# Patient Record
Sex: Female | Born: 1970
Health system: Southern US, Community
[De-identification: ages and names within clinical notes are randomized; demographics above are authoritative.]

## PROBLEM LIST (undated history)

## (undated) DIAGNOSIS — Z5189 Encounter for other specified aftercare: Secondary | ICD-10-CM

## (undated) DIAGNOSIS — K219 Gastro-esophageal reflux disease without esophagitis: Secondary | ICD-10-CM

## (undated) DIAGNOSIS — E079 Disorder of thyroid, unspecified: Secondary | ICD-10-CM

## (undated) DIAGNOSIS — T7840XA Allergy, unspecified, initial encounter: Secondary | ICD-10-CM

## (undated) DIAGNOSIS — I10 Essential (primary) hypertension: Secondary | ICD-10-CM

## (undated) DIAGNOSIS — N62 Hypertrophy of breast: Secondary | ICD-10-CM

## (undated) HISTORY — DX: Essential (primary) hypertension: I10

## (undated) HISTORY — PX: CHOLECYSTECTOMY: SHX55

## (undated) HISTORY — PX: TUBAL LIGATION: SHX77

## (undated) HISTORY — DX: Disorder of thyroid, unspecified: E07.9

## (undated) HISTORY — DX: Hypertrophy of breast: N62

## (undated) HISTORY — PX: ABDOMINAL HYSTERECTOMY: SHX81

## (undated) HISTORY — DX: Encounter for other specified aftercare: Z51.89

## (undated) HISTORY — PX: REDUCTION MAMMAPLASTY: SUR839

## (undated) HISTORY — PX: SHOULDER ARTHROSCOPY: SHX128

## (undated) HISTORY — DX: Allergy, unspecified, initial encounter: T78.40XA

---

## 2012-11-19 DIAGNOSIS — Z09 Encounter for follow-up examination after completed treatment for conditions other than malignant neoplasm: Secondary | ICD-10-CM | POA: Insufficient documentation

## 2012-12-29 DIAGNOSIS — D509 Iron deficiency anemia, unspecified: Secondary | ICD-10-CM | POA: Insufficient documentation

## 2012-12-29 DIAGNOSIS — E669 Obesity, unspecified: Secondary | ICD-10-CM | POA: Insufficient documentation

## 2012-12-29 DIAGNOSIS — D649 Anemia, unspecified: Secondary | ICD-10-CM | POA: Insufficient documentation

## 2012-12-29 DIAGNOSIS — I10 Essential (primary) hypertension: Secondary | ICD-10-CM

## 2012-12-29 HISTORY — DX: Essential (primary) hypertension: I10

## 2013-07-17 ENCOUNTER — Ambulatory Visit: Payer: Self-pay | Admitting: Family Medicine

## 2013-12-18 DIAGNOSIS — E0590 Thyrotoxicosis, unspecified without thyrotoxic crisis or storm: Secondary | ICD-10-CM | POA: Insufficient documentation

## 2014-05-24 DIAGNOSIS — R7303 Prediabetes: Secondary | ICD-10-CM | POA: Insufficient documentation

## 2014-09-28 ENCOUNTER — Ambulatory Visit (INDEPENDENT_AMBULATORY_CARE_PROVIDER_SITE_OTHER): Payer: Medicaid Other | Admitting: Family Medicine

## 2014-09-28 ENCOUNTER — Encounter: Payer: Self-pay | Admitting: Family Medicine

## 2014-09-28 VITALS — BP 126/82 | HR 91 | Temp 98.1°F | Resp 18 | Ht 62.0 in | Wt 207.2 lb

## 2014-09-28 DIAGNOSIS — E0590 Thyrotoxicosis, unspecified without thyrotoxic crisis or storm: Secondary | ICD-10-CM | POA: Insufficient documentation

## 2014-09-28 DIAGNOSIS — IMO0001 Reserved for inherently not codable concepts without codable children: Secondary | ICD-10-CM | POA: Insufficient documentation

## 2014-09-28 DIAGNOSIS — R5383 Other fatigue: Secondary | ICD-10-CM | POA: Insufficient documentation

## 2014-09-28 DIAGNOSIS — J302 Other seasonal allergic rhinitis: Secondary | ICD-10-CM | POA: Insufficient documentation

## 2014-09-28 DIAGNOSIS — E669 Obesity, unspecified: Secondary | ICD-10-CM | POA: Diagnosis not present

## 2014-09-28 DIAGNOSIS — E042 Nontoxic multinodular goiter: Secondary | ICD-10-CM | POA: Insufficient documentation

## 2014-09-28 DIAGNOSIS — R7989 Other specified abnormal findings of blood chemistry: Secondary | ICD-10-CM | POA: Insufficient documentation

## 2014-09-28 MED ORDER — PHENTERMINE HCL 37.5 MG PO CAPS: 37.5000 mg | ORAL_CAPSULE | Freq: Every day | ORAL | Status: DC

## 2014-09-28 MED ORDER — PHENTERMINE HCL 37.5 MG PO CAPS
37.5000 mg | ORAL_CAPSULE | Freq: Every day | ORAL | Status: DC
Start: 2014-09-28 — End: 2014-09-28

## 2014-09-28 NOTE — Progress Notes (Signed)
Name: Wendy Kent   MRN: [id number]    DOB: 27-Dec-1970   Date:09/28/2014       Progress Note  Subjective  Chief Complaint  Chief Complaint  Patient presents with  . Obesity     3 month follow-up    HPI  Patient is here for routine follow up of Obesity. The patient is appropriately concerned about their weight. Onset of weight issues started in ad. Highest recorded weight 222 lbs. Associated symptoms or diseases include pain in weight bearing joints and do not include HTN, HLD, DMII, skin lesions, depression, poor self esteem, snoring, sleep apnea at this time. Current efforts to reduce weight include low carbohydrate, low fat diet with regular exercise and prescription phentermine.  No side effects reported from the phentermine. She feels today that she has hit a plateau with her weight loss. She is busy with school, work, her family. She is planning to take the summer off from school so she can focus on her family and get more exercise.  Describe current exercise regimen: walking 3days a week for 30-41mins. Describe current diet regimen: Low carbohydrate, low fat, high protein diet with regular meals.        History  Substance Use Topics  . Smoking status: Never Smoker   . Smokeless tobacco: Not on file  . Alcohol Use: 0.0 oz/week    0 Standard drinks or equivalent per week     Comment: socially     Current outpatient prescriptions:  .  fluticasone (FLONASE) 50 MCG/ACT nasal spray, Place into the nose., Disp: , Rfl:  .  phentermine 37.5 MG capsule, Take 1 capsule (37.5 mg total) by mouth daily., Disp: 30 capsule, Rfl: 0 .  phentermine 37.5 MG capsule, Take 1 capsule (37.5 mg total) by mouth daily., Disp: 30 capsule, Rfl: 0 .  phentermine 37.5 MG capsule, Take 1 capsule (37.5 mg total) by mouth daily., Disp: 30 capsule, Rfl: 0  Allergies  Allergen Reactions  . Percocet [Oxycodone-Acetaminophen] Anaphylaxis    Review of Systems  Ten systems reviewed and is negative  except as mentioned in HPI.    Objective  BP 126/82 mmHg  Pulse 91  Temp(Src) 98.1 F (36.7 C) (Oral)  Resp 18  Ht 5\' 2"  (1.575 m)  Wt 207 lb 3.2 oz (93.985 kg)  BMI 37.89 kg/m2  SpO2 97%  Physical Exam  Constitutional: Patient appears obese and well-nourished. In no distress.  HEENT:  - Head: Normocephalic and atraumatic.  - Ears: Bilateral TMs gray, no erythema or effusion - Nose: Nasal mucosa moist - Mouth/Throat: Oropharynx is clear and moist. No tonsillar hypertrophy or erythema. No post nasal drainage.  - Eyes: Conjunctivae clear, EOM movements normal. PERRLA. No scleral icterus.  Neck: Normal range of motion. Neck supple. No JVD present. No thyromegaly present.  Cardiovascular: Normal rate, regular rhythm and normal heart sounds.  No murmur heard.  Pulmonary/Chest: Effort normal and breath sounds normal. No respiratory distress. Musculoskeletal: Normal range of motion bilateral UE and LE, no joint effusions. Peripheral vascular: Bilateral LE no edema. Neurological: CN II-XII grossly intact with no focal deficits. Alert and oriented to person, place, and time. Coordination, balance, strength, speech and gait are normal.  Skin: Skin is warm and dry. No rash noted. No erythema.  Psychiatric: Patient has a normal mood and affect. Behavior is normal in office today. Judgment and thought content normal in office today.    Assessment & Plan  1. Obesity, Class II, BMI 35-39.9 The patient has  been counseled on their higher than normal BMI.  They have verbally expressed understanding their increased risk for other diseases.  In efforts to meet a better target BMI goal the patient has been counseled on lifestyle, diet and exercise modification tactics. Start with moderate intensity aerobic exercise (walking, jogging, elliptical, swimming, group or individual sports, hiking) at least 69mins a day at least 4 days a week and increase intensity, duration, frequency as tolerated. Diet  should include well balance fresh fruits and vegetables avoiding processed foods, carbohydrates and sugars. Drink at least 8oz 10 glasses a day avoiding sodas, sugary fruit drinks, sweetened tea. Check weight on a reliable scale daily and monitor weight loss progress daily. Consider investing in mobile phone apps that will help keep track of weight loss goals.   - phentermine 37.5 MG capsule; Take 1 capsule (37.5 mg total) by mouth daily.  Dispense: 30 capsule; Refill: 0 - phentermine 37.5 MG capsule; Take 1 capsule (37.5 mg total) by mouth daily.  Dispense: 30 capsule; Refill: 0 - phentermine 37.5 MG capsule; Take 1 capsule (37.5 mg total) by mouth daily.  Dispense: 30 capsule; Refill: 0

## 2014-10-17 ENCOUNTER — Emergency Department: Payer: Medicaid Other

## 2014-10-17 ENCOUNTER — Emergency Department
Admission: EM | Admit: 2014-10-17 | Discharge: 2014-10-17 | Disposition: A | Discharge status: Home / Self Care | Payer: Medicaid Other | Attending: Emergency Medicine | Admitting: Emergency Medicine

## 2014-10-17 ENCOUNTER — Encounter: Payer: Self-pay | Admitting: *Deleted

## 2014-10-17 ENCOUNTER — Other Ambulatory Visit: Payer: Self-pay

## 2014-10-17 DIAGNOSIS — M62830 Muscle spasm of back: Secondary | ICD-10-CM | POA: Diagnosis not present

## 2014-10-17 DIAGNOSIS — Z7951 Long term (current) use of inhaled steroids: Secondary | ICD-10-CM | POA: Diagnosis not present

## 2014-10-17 DIAGNOSIS — Z79899 Other long term (current) drug therapy: Secondary | ICD-10-CM | POA: Insufficient documentation

## 2014-10-17 DIAGNOSIS — R0789 Other chest pain: Secondary | ICD-10-CM | POA: Diagnosis present

## 2014-10-17 LAB — BASIC METABOLIC PANEL
Anion gap: 5 (ref 5–15)
BUN: 12 mg/dL (ref 6–20)
CO2: 28 mmol/L (ref 22–32)
Calcium: 8.7 mg/dL — ABNORMAL LOW (ref 8.9–10.3)
Chloride: 104 mmol/L (ref 101–111)
Creatinine, Ser: 0.64 mg/dL (ref 0.44–1.00)
GFR calc Af Amer: >60 mL/min (ref >60)
GFR calc non Af Amer: >60 mL/min (ref >60)
Glucose, Bld: 96 mg/dL (ref 65–99)
Potassium: 4.1 mmol/L (ref 3.5–5.1)
Sodium: 137 mmol/L (ref 135–145)

## 2014-10-17 LAB — CBC
HCT: 38.2 % (ref 35.0–47.0)
Hemoglobin: 12.3 g/dL (ref 12.0–16.0)
MCH: 28.9 pg (ref 26.0–34.0)
MCHC: 32.4 g/dL (ref 32.0–36.0)
MCV: 89.3 fL (ref 80.0–100.0)
Platelets: 315 10*3/uL (ref 150–440)
RBC: 4.27 MIL/uL (ref 3.80–5.20)
RDW: 15.1 % — ABNORMAL HIGH (ref 11.5–14.5)
WBC: 7.5 10*3/uL (ref 3.6–11.0)

## 2014-10-17 LAB — TROPONIN I: Troponin I: <0.03 ng/mL (ref <0.031)

## 2014-10-17 MED ORDER — CYCLOBENZAPRINE HCL 10 MG PO TABS: 10.0000 mg | ORAL_TABLET | Freq: Three times a day (TID) | ORAL | Status: DC | PRN

## 2014-10-17 MED ORDER — IBUPROFEN 600 MG PO TABS: 600.0000 mg | ORAL_TABLET | Freq: Three times a day (TID) | ORAL | Status: DC | PRN

## 2014-10-17 NOTE — ED Notes (Signed)
MD at bedside. 

## 2014-10-17 NOTE — Discharge Instructions (Signed)
You examine evaluation are reassuring, and I suspect your discomfort is due to muscle spasm around the shoulder blade and paraspinous muscles. We discussed continued use heat, massage, and I am prescribing antiviral triage ibuprofen, as well as muscle relaxer, Flexeril. Return to emergency department for any new or worsening condition including chest pain, trouble breathing, abdominal pain, black or bloody stools, fever, or any other symptoms concerning to you.   Musculoskeletal Pain Musculoskeletal pain is muscle and boney aches and pains. These pains can occur in any part of the body. Your caregiver may treat you without knowing the cause of the pain. They may treat you if blood or urine tests, X-rays, and other tests were normal.  CAUSES There is often not a definite cause or reason for these pains. These pains may be caused by a type of germ (virus). The discomfort may also come from overuse. Overuse includes working out too hard when your body is not fit. Boney aches also come from weather changes. Bone is sensitive to atmospheric pressure changes. HOME CARE INSTRUCTIONS   Ask when your test results will be ready. Make sure you get your test results.  Only take over-the-counter or prescription medicines for pain, discomfort, or fever as directed by your caregiver. If you were given medications for your condition, do not drive, operate machinery or power tools, or sign legal documents for 24 hours. Do not drink alcohol. Do not take sleeping pills or other medications that may interfere with treatment.  Continue all activities unless the activities cause more pain. When the pain lessens, slowly resume normal activities. Gradually increase the intensity and duration of the activities or exercise.  During periods of severe pain, bed rest may be helpful. Lay or sit in any position that is comfortable.  Putting ice on the injured area.  Put ice in a bag.  Place a towel between your skin and the  bag.  Leave the ice on for 15 to 20 minutes, 3 to 4 times a day.  Follow up with your caregiver for continued problems and no reason can be found for the pain. If the pain becomes worse or does not go away, it may be necessary to repeat tests or do additional testing. Your caregiver may need to look further for a possible cause. SEEK IMMEDIATE MEDICAL CARE IF:  You have pain that is getting worse and is not relieved by medications.  You develop chest pain that is associated with shortness or breath, sweating, feeling sick to your stomach (nauseous), or throw up (vomit).  Your pain becomes localized to the abdomen.  You develop any new symptoms that seem different or that concern you. MAKE SURE YOU:   Understand these instructions.  Will watch your condition.  Will get help right away if you are not doing well or get worse. Document Released: 04/09/2005 Document Revised: 07/02/2011 Document Reviewed: 12/12/2012 [facility] Patient Information 2015 [place], [place]. This information is not intended to replace advice given to you by your health care provider. Make sure you discuss any questions you have with your health care provider.

## 2014-10-17 NOTE — ED Notes (Signed)
Pt c/o R lateral rib pain that has become progressively worse over past 2 weeks. Pt denies injury. Pt denies cough, lung disease, and cardiac hx. Pt denies fever. Pain reproducible w/ movement and inspiration. Pt states nausea accompanies pain.

## 2014-10-17 NOTE — ED Provider Notes (Signed)
[facility] Emergency Department Provider Note   ____________________________________________  Time seen: 7:55 AM I have reviewed the triage vital signs and the triage nursing note.  HISTORY  Chief Complaint Chest Pain   Historian Patient  HPI Wendy Kent is a 44 y.o. female is complaining of approximately 2 weeks of pain at her right back next to the spine, under the shoulder blade. There is no specific trauma. She is seen her primary care doctor who thought it was musculoskeletal. It does seem worse with lifting her arm and moving her trunk. Pain is considered moderate. Pain is occasionally sharp. There is no chest pain, shortness of breath, urinary symptoms, fever, or abdominal pain. She works a Network engineer job, and is right-handed, and use the mouse a lot. She has tried heat and naproxen at home.    Past Medical History  Diagnosis Date  . Allergy   . Blood transfusion without reported diagnosis   . Thyroid disease     patient was cleared by specialist. History of Thyroid nodules.    Patient Active Problem List   Diagnosis Date Noted  . Multinodular goiter 09/28/2014  . Obesity, Class I, BMI 30-34.9 09/28/2014  . Hyperthyroidism, subclinical 09/28/2014  . Fatigue 09/28/2014  . Allergic rhinitis, seasonal 09/28/2014  . Abnormal thyroid stimulating hormone (TSH) level 09/28/2014    Past Surgical History  Procedure Laterality Date  . Abdominal hysterectomy      patient still has ovaries  . Cholecystectomy    . Shoulder arthroscopy Right   . Tubal ligation      Current Outpatient Rx  Name  Route  Sig  Dispense  Refill  . fluticasone (FLONASE) 50 MCG/ACT nasal spray   Nasal   Place into the nose.         . phentermine 37.5 MG capsule   Oral   Take 1 capsule (37.5 mg total) by mouth daily.   30 capsule   0     REFILL 10/28/14   . cyclobenzaprine (FLEXERIL) 10 MG tablet   Oral   Take 1 tablet (10 mg total) by mouth every 8 (eight) hours  as needed for muscle spasms.   20 tablet   0   . ibuprofen (ADVIL,MOTRIN) 600 MG tablet   Oral   Take 1 tablet (600 mg total) by mouth every 8 (eight) hours as needed.   20 tablet   0   . phentermine 37.5 MG capsule   Oral   Take 1 capsule (37.5 mg total) by mouth daily.   30 capsule   0     REFILL 09/28/14   . phentermine 37.5 MG capsule   Oral   Take 1 capsule (37.5 mg total) by mouth daily.   30 capsule   0     REFILL 11/28/14     Allergies Percocet  History reviewed. No pertinent family history.  Social History History  Substance Use Topics  . Smoking status: Never Smoker   . Smokeless tobacco: Not on file  . Alcohol Use: 0.0 oz/week    0 Standard drinks or equivalent per week     Comment: socially    Review of Systems  Constitutional: Negative for fever. Eyes: Negative for visual changes. ENT: Negative for sore throat. Cardiovascular: Negative for chest pain. Respiratory: Negative for shortness of breath. Gastrointestinal: Negative for abdominal pain, vomiting and diarrhea. Genitourinary: Negative for dysuria. Musculoskeletal: Back pain as per history of present illness Skin: Negative for rash. Neurological: Negative for headaches, focal  weakness or numbness. 10 point Review of Systems otherwise negative ____________________________________________   PHYSICAL EXAM:  VITAL SIGNS: ED Triage Vitals  Enc Vitals Group     BP --      Pulse --      Resp --      Temp --      Temp src --      SpO2 --      Weight 10/17/14 0724 207 lb (93.895 kg)     Height 10/17/14 0724 5\' 4"  (1.626 m)     Head Cir --      Peak Flow --      Pain Score 10/17/14 0638 8     Pain Loc --      Pain Edu? --      Excl. in [place]? --      Constitutional: Alert and oriented. Well appearing and in no distress. Eyes: Conjunctivae are normal. PERRL. Normal extraocular movements. ENT   Head: Normocephalic and atraumatic.   Nose: No congestion/rhinnorhea.    Mouth/Throat: Mucous membranes are moist.   Neck: No stridor. Cardiovascular/Chest: No breast pain. Chest nontender to palpation. Normal rate, regular rhythm.  No murmurs, rubs, or gallops. Respiratory: Normal respiratory effort without tachypnea nor retractions. Breath sounds are clear and equal bilaterally. No wheezes/rales/rhonchi. Gastrointestinal: Soft. No distention, no guarding, no rebound. Nontender  Genitourinary/rectal: Deferred Musculoskeletal: Nontender with normal range of motion in all extremities. Palpable muscle spasm right paraspinous thoracic area below the shoulder blade which is significantly tender to palpation. Neurologic:  Normal speech and language. No gross focal neurologic deficits are appreciated. Skin:  Skin is warm, dry and intact. No rash noted. Psychiatric: Mood and affect are normal. Speech and behavior are normal. Patient exhibits appropriate insight and judgment.  ____________________________________________   EKG I, Lisa Roca, MD, the attending physician have personally viewed and interpreted all ECGs.  79 bpm. Normal sinus rhythm. Narrow QRS. Normal axis. Q waves septally. Normal ST and T-wave. ____________________________________________  LABS (pertinent positives/negatives)  CBC within normal limits Metabolic panel within normal limits Troponin less than 0.03  ____________________________________________  RADIOLOGY All Xrays were viewed by me. Imagine interpreted by Radiologist.  Chest x-ray negative __________________________________________  PROCEDURES  Procedure(s) performed: None Critical Care performed: None  ____________________________________________   ED COURSE / ASSESSMENT AND PLAN  CONSULTATIONS: None  Pertinent labs & imaging results that were available during my care of the patient were reviewed by me and considered in my medical decision making (see chart for details).   Patient's symptoms do seem a school  skeletal, with a palpable and tender muscle spasm in the right thoracic back. 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 She is to switch this to the other side at work. I discussed with her that her exam, and evaluation are reassuring. She is to be prescribed ibuprofen, as well as Flexeril.  Patient / Family / Caregiver informed of clinical course, medical decision-making process, and agree with plan.   I discussed return precautions, follow-up instructions, and discharged instructions with patient and/or family.  ___________________________________________   FINAL CLINICAL IMPRESSION(S) / ED DIAGNOSES   Final diagnoses:  Spasm of muscle, back    FOLLOW UP  Referred to: Her primary care doctor   Lisa Roca, MD 10/17/14 [phone]

## 2014-11-10 ENCOUNTER — Ambulatory Visit (INDEPENDENT_AMBULATORY_CARE_PROVIDER_SITE_OTHER): Payer: Medicaid Other | Admitting: Family Medicine

## 2014-11-10 ENCOUNTER — Encounter (INDEPENDENT_AMBULATORY_CARE_PROVIDER_SITE_OTHER): Payer: Self-pay

## 2014-11-10 ENCOUNTER — Encounter: Payer: Self-pay | Admitting: Family Medicine

## 2014-11-10 VITALS — BP 124/78 | HR 105 | Temp 97.8°F | Resp 18 | Ht 62.0 in | Wt 208.5 lb

## 2014-11-10 DIAGNOSIS — Z124 Encounter for screening for malignant neoplasm of cervix: Secondary | ICD-10-CM | POA: Insufficient documentation

## 2014-11-10 DIAGNOSIS — Z1239 Encounter for other screening for malignant neoplasm of breast: Secondary | ICD-10-CM | POA: Insufficient documentation

## 2014-11-10 DIAGNOSIS — R7989 Other specified abnormal findings of blood chemistry: Secondary | ICD-10-CM | POA: Diagnosis not present

## 2014-11-10 DIAGNOSIS — I10 Essential (primary) hypertension: Secondary | ICD-10-CM

## 2014-11-10 DIAGNOSIS — Z13220 Encounter for screening for lipoid disorders: Secondary | ICD-10-CM | POA: Diagnosis not present

## 2014-11-10 DIAGNOSIS — N62 Hypertrophy of breast: Secondary | ICD-10-CM

## 2014-11-10 DIAGNOSIS — Z0000 Encounter for general adult medical examination without abnormal findings: Secondary | ICD-10-CM | POA: Diagnosis not present

## 2014-11-10 HISTORY — DX: Hypertrophy of breast: N62

## 2014-11-10 NOTE — Progress Notes (Signed)
Name: Wendy Kent   MRN: [id number]    DOB: 08-01-1970   Date:11/10/2014       Progress Note  Subjective  Chief Complaint  Chief Complaint  Patient presents with  . Annual Exam    HPI  Patient is here today for a Complete Female Physical Exam:  The patient has has no unusual complaints. Overall feels healthy. Diet is well balanced. In general does exercise regularly but is having a hard time finding time due to school, work, children. Also having more pain in neck and shoulders and back due to large breasts. Sees dentist regularly and addresses vision concerns with ophthalmologist if applicable. In regards to sexual activity the patient is currently sexually active. Currently is not concerned about exposure to any STDs.   Menstrual history is notable for hysterectomy due to fibroid uterus. Unsure if cervix removed or not.   Thyroid: Patient presents for evaluation of hypothyroidism and goiter. Current symptoms include fatigue, goiter. Patient denies weight gain, feeling cold and cold intolerance, constipation, swelling, tremulousness, palpitations, diarrhea, change in skin,  nails, or hair, depression, ocular symptoms. Previously has had extensive work up 2015 due to multinodular goiter with borderline thyroid function lab testing. No fluctuations in blood pressure, tolerating medication well.    Past Medical History  Diagnosis Date  . Allergy   . Blood transfusion without reported diagnosis   . Thyroid disease     patient was cleared by specialist. History of Thyroid nodules.  . Absolute anemia 12/29/2012  . BP (high blood pressure) 12/29/2012    Past Surgical History  Procedure Laterality Date  . Abdominal hysterectomy      patient still has ovaries  . Cholecystectomy    . Shoulder arthroscopy Right   . Tubal ligation      No family history on file.  History   Social History  . Marital Status: Divorced    Spouse Name: N/A  . Number of Children: 4  . Years of  Education: N/A   Occupational History  . Scientist, research (life sciences)   Social History Main Topics  . Smoking status: Never Smoker   . Smokeless tobacco: Not on file  . Alcohol Use: 0.0 oz/week    0 Standard drinks or equivalent per week     Comment: socially  . Drug Use: No  . Sexual Activity: Yes   Other Topics Concern  . Not on file   Social History Narrative     Current outpatient prescriptions:  .  cyclobenzaprine (FLEXERIL) 10 MG tablet, Take 1 tablet (10 mg total) by mouth every 8 (eight) hours as needed for muscle spasms., Disp: 20 tablet, Rfl: 0 .  ferrous gluconate (FERGON) 324 MG tablet, Take 324 mg by mouth., Disp: , Rfl:  .  fluticasone (FLONASE) 50 MCG/ACT nasal spray, Place into the nose., Disp: , Rfl:  .  hydrochlorothiazide (HYDRODIURIL) 12.5 MG tablet, Take 12.5 mg by mouth., Disp: , Rfl:  .  ibuprofen (ADVIL,MOTRIN) 600 MG tablet, Take 1 tablet (600 mg total) by mouth every 8 (eight) hours as needed., Disp: 20 tablet, Rfl: 0 .  phentermine 37.5 MG capsule, Take 1 capsule (37.5 mg total) by mouth daily., Disp: 30 capsule, Rfl: 0  Allergies  Allergen Reactions  . Percocet [Oxycodone-Acetaminophen] Anaphylaxis    ROS  CONSTITUTIONAL: No significant weight changes, fever, chills, weakness or fatigue.  HEENT:  - Eyes: No visual changes.  - Ears: No auditory changes. No pain.  - Nose: No  sneezing, congestion, runny nose. - Throat: No sore throat. No changes in swallowing. SKIN: No rash or itching.  CARDIOVASCULAR: No chest pain, chest pressure or chest discomfort. No palpitations or edema.  RESPIRATORY: No shortness of breath, cough or sputum.  GASTROINTESTINAL: No anorexia, nausea, vomiting. No changes in bowel habits. No abdominal pain or blood.  GENITOURINARY: No dysuria. No frequency. No discharge.  NEUROLOGICAL: No headache, dizziness, syncope, paralysis, ataxia, numbness or tingling in the extremities. No memory changes. No change in bowel or  bladder control.  MUSCULOSKELETAL: No joint pain. No muscle pain. HEMATOLOGIC: No anemia, bleeding or bruising.  LYMPHATICS: No enlarged lymph nodes.  PSYCHIATRIC: No change in mood. No change in sleep pattern.  ENDOCRINOLOGIC: No reports of sweating, cold or heat intolerance. No polyuria or polydipsia.   Objective  Filed Vitals:   11/10/14 0833  BP: 124/78  Pulse: 105  Temp: 97.8 F (36.6 C)  TempSrc: Oral  Resp: 18  Height: 5' 2"  (1.575 m)  Weight: 208 lb 8 oz (94.575 kg)  SpO2: 98%   Body mass index is 38.13 kg/(m^2).  No exam data present  Recent Results (from the past 2160 hour(s))  CBC     Status: Abnormal   Collection Time: 10/17/14  6:59 AM  Result Value Ref Range   WBC 7.5 3.6 - 11.0 K/uL   RBC 4.27 3.80 - 5.20 MIL/uL   Hemoglobin 12.3 12.0 - 16.0 g/dL   HCT 38.2 35.0 - 47.0 %   MCV 89.3 80.0 - 100.0 fL   MCH 28.9 26.0 - 34.0 pg   MCHC 32.4 32.0 - 36.0 g/dL   RDW 15.1 (H) 11.5 - 14.5 %   Platelets 315 150 - 440 K/uL  Basic metabolic panel     Status: Abnormal   Collection Time: 10/17/14  6:59 AM  Result Value Ref Range   Sodium 137 135 - 145 mmol/L   Potassium 4.1 3.5 - 5.1 mmol/L   Chloride 104 101 - 111 mmol/L   CO2 28 22 - 32 mmol/L   Glucose, Bld 96 65 - 99 mg/dL   BUN 12 6 - 20 mg/dL   Creatinine, Ser 0.64 0.44 - 1.00 mg/dL   Calcium 8.7 (L) 8.9 - 10.3 mg/dL   GFR calc non Af Amer >60 >60 mL/min   GFR calc Af Amer >60 >60 mL/min    Comment: (NOTE) The eGFR has been calculated using the CKD EPI equation. This calculation has not been validated in all clinical situations. eGFR's persistently <60 mL/min signify possible Chronic Kidney Disease.    Anion gap 5 5 - 15  Troponin I     Status: None   Collection Time: 10/17/14  6:59 AM  Result Value Ref Range   Troponin I <0.03 <0.031 ng/mL    Comment:        NO INDICATION OF MYOCARDIAL INJURY.     Physical Exam  Constitutional: Patient appears overweight and well-nourished. In no distress.   HEENT:  - Head: Normocephalic and atraumatic.  - Ears: Bilateral TMs gray, no erythema or effusion - Nose: Nasal mucosa moist - Mouth/Throat: Oropharynx is clear and moist. No tonsillar hypertrophy or erythema. No post nasal drainage.  - Eyes: Conjunctivae clear, EOM movements normal. PERRLA. No scleral icterus.  Neck: Normal range of motion. Neck supple. No JVD present. No thyromegaly present.  Cardiovascular: Normal rate, regular rhythm and normal heart sounds.  No murmur heard.  Pulmonary/Chest: Effort normal and breath sounds normal. No respiratory distress. Abdominal:  Soft. Bowel sounds are normal, no distension. There is no tenderness. no masses BREAST: Bilateral large pendulous breasts, exam normal with no masses, skin changes or nipple discharge FEMALE GENITALIA:  External genitalia normal External urethra normal Vaginal vault normal without discharge or lesions Cervix surgically absent without discharge or lesions Bimanual exam normal without masses RECTAL: no rectal masses or hemorrhoids Musculoskeletal: Normal range of motion bilateral UE and LE, no joint effusions. Peripheral vascular: Bilateral LE no edema. Neurological: CN II-XII grossly intact with no focal deficits. Alert and oriented to person, place, and time. Coordination, balance, strength, speech and gait are normal.  Skin: Skin is warm and dry. No rash noted. No erythema.  Psychiatric: Patient has a normal mood and affect. Behavior is normal in office today. Judgment and thought content normal in office today.   Assessment & Plan  1. Annual physical exam CPE completed.  2. Large breasts Likely contributing to neck shoulder back pain. Recommended good supportive bra and consult with surgeon regarding reduction.  3. Abnormal thyroid stimulating hormone (TSH) level History of multinodular goiter with significant work up with Endocrinologist no concerning findings. Due for monitoring blood work.  - TSH - T3,  free - T4, free  4. Essential hypertension Well controled.   - CBC with Differential/Platelet - Comprehensive metabolic panel  5. Screening cholesterol level Due for routine blood work.  - Lipid panel  6. Cervical cancer screening Attempted PAP smear testing. If no cervical cells found on pathology no longer needs PAP exams.  - Pap IG w/ reflex to HPV when ASC-U  7. Breast cancer screening  - MM Digital Screening; Future

## 2014-11-11 LAB — TSH: TSH: 0.743 u[IU]/mL (ref 0.450–4.500)

## 2014-11-11 LAB — COMPREHENSIVE METABOLIC PANEL WITH GFR
ALT: 11 IU/L (ref 0–32)
AST: 10 IU/L (ref 0–40)
Albumin/Globulin Ratio: 1.4 (ref 1.1–2.5)
Albumin: 4.3 g/dL (ref 3.5–5.5)
Alkaline Phosphatase: 79 IU/L (ref 39–117)
BUN/Creatinine Ratio: 14 (ref 9–23)
BUN: 10 mg/dL (ref 6–24)
Bilirubin Total: 0.7 mg/dL (ref 0.0–1.2)
CO2: 24 mmol/L (ref 18–29)
Calcium: 9.9 mg/dL (ref 8.7–10.2)
Chloride: 99 mmol/L (ref 97–108)
Creatinine, Ser: 0.73 mg/dL (ref 0.57–1.00)
GFR calc Af Amer: 116 mL/min/1.73
GFR calc non Af Amer: 100 mL/min/1.73
Globulin, Total: 3.1 g/dL (ref 1.5–4.5)
Glucose: 88 mg/dL (ref 65–99)
Potassium: 4.8 mmol/L (ref 3.5–5.2)
Sodium: 139 mmol/L (ref 134–144)
Total Protein: 7.4 g/dL (ref 6.0–8.5)

## 2014-11-11 LAB — CBC WITH DIFFERENTIAL/PLATELET
Basophils Absolute: 0 x10E3/uL (ref 0.0–0.2)
Basos: 0 %
EOS (ABSOLUTE): 0.1 x10E3/uL (ref 0.0–0.4)
Eos: 1 %
Hematocrit: 39.7 % (ref 34.0–46.6)
Hemoglobin: 13.1 g/dL (ref 11.1–15.9)
Immature Grans (Abs): 0 x10E3/uL (ref 0.0–0.1)
Immature Granulocytes: 0 %
Lymphocytes Absolute: 2 x10E3/uL (ref 0.7–3.1)
Lymphs: 25 %
MCH: 28.7 pg (ref 26.6–33.0)
MCHC: 33 g/dL (ref 31.5–35.7)
MCV: 87 fL (ref 79–97)
Monocytes Absolute: 0.4 x10E3/uL (ref 0.1–0.9)
Monocytes: 4 %
Neutrophils Absolute: 5.7 x10E3/uL (ref 1.4–7.0)
Neutrophils: 70 %
Platelets: 376 x10E3/uL (ref 150–379)
RBC: 4.56 x10E6/uL (ref 3.77–5.28)
RDW: 15 % (ref 12.3–15.4)
WBC: 8.2 x10E3/uL (ref 3.4–10.8)

## 2014-11-11 LAB — LIPID PANEL
Chol/HDL Ratio: 1.8 ratio units (ref 0.0–4.4)
Cholesterol, Total: 120 mg/dL (ref 100–199)
HDL: 68 mg/dL (ref >39)
LDL Calculated: 38 mg/dL (ref 0–99)
Triglycerides: 71 mg/dL (ref 0–149)
VLDL Cholesterol Cal: 14 mg/dL (ref 5–40)

## 2014-11-11 LAB — T4, FREE: Free T4: 1.31 ng/dL (ref 0.82–1.77)

## 2014-11-11 LAB — T3, FREE: T3, Free: 3.4 pg/mL (ref 2.0–4.4)

## 2014-11-12 LAB — PAP IG W/ RFLX HPV ASCU: PAP Smear Comment: 0

## 2014-11-23 ENCOUNTER — Ambulatory Visit
Admission: RE | Admit: 2014-11-23 | Discharge: 2014-11-23 | Disposition: A | Discharge status: Home / Self Care | Payer: Medicaid Other | Source: Ambulatory Visit | Attending: Family Medicine | Admitting: Family Medicine

## 2014-11-23 DIAGNOSIS — Z1231 Encounter for screening mammogram for malignant neoplasm of breast: Secondary | ICD-10-CM | POA: Insufficient documentation

## 2014-11-23 DIAGNOSIS — Z1239 Encounter for other screening for malignant neoplasm of breast: Secondary | ICD-10-CM

## 2015-02-01 ENCOUNTER — Ambulatory Visit (INDEPENDENT_AMBULATORY_CARE_PROVIDER_SITE_OTHER): Payer: Medicaid Other | Admitting: Family Medicine

## 2015-02-01 ENCOUNTER — Encounter: Payer: Self-pay | Admitting: Family Medicine

## 2015-02-01 VITALS — BP 134/88 | HR 93 | Temp 98.1°F | Resp 16 | Ht 62.0 in | Wt 211.2 lb

## 2015-02-01 DIAGNOSIS — D509 Iron deficiency anemia, unspecified: Secondary | ICD-10-CM

## 2015-02-01 DIAGNOSIS — I10 Essential (primary) hypertension: Secondary | ICD-10-CM | POA: Diagnosis not present

## 2015-02-01 DIAGNOSIS — IMO0001 Reserved for inherently not codable concepts without codable children: Secondary | ICD-10-CM

## 2015-02-01 MED ORDER — FERROUS GLUCONATE 324 (38 FE) MG PO TABS: 324.0000 mg | ORAL_TABLET | Freq: Every day | ORAL | Status: DC

## 2015-02-01 MED ORDER — PHENTERMINE HCL 37.5 MG PO CAPS
37.5000 mg | ORAL_CAPSULE | Freq: Every day | ORAL | Status: DC
Start: 2015-02-01 — End: 2015-06-15

## 2015-02-01 MED ORDER — PHENTERMINE HCL 37.5 MG PO CAPS: 37.5000 mg | ORAL_CAPSULE | Freq: Every day | ORAL | Status: DC

## 2015-02-01 MED ORDER — HYDROCHLOROTHIAZIDE 25 MG PO TABS: 25.0000 mg | ORAL_TABLET | Freq: Every day | ORAL | Status: DC

## 2015-02-01 NOTE — Progress Notes (Signed)
Name: Wendy Kent   MRN: [id number]    DOB: [birth year]   Date:02/01/2015       Progress Note  Subjective  Chief Complaint  Chief Complaint  Patient presents with  . Thyroid Problem    pt here for medication refills  . Hypertension  . Anemia    HPI  Patient is here for routine follow up of Obesity. The patient is appropriately concerned about their weight. Onset of weight issues started in ad. Highest recorded weight 222 lbs. Associated symptoms or diseases include pain in weight bearing joints and do not include HTN, HLD, DMII, skin lesions, depression, poor self esteem, snoring, sleep apnea at this time. Current efforts to reduce weight include low carbohydrate, low fat diet with regular exercise and prescription phentermine. No side effects reported from the phentermine. She feels today that she has hit a plateau with her weight loss. She is busy with school, work, her family. Related conditions include HTN, which is well controlled on HCTZ most days but she feels her blood pressure might be fluctuating higher when she is stressed. Taking iron supplement for iron deficiency anemia, inconsistently.  Describe current exercise regimen: walking 3days a week for 30-63mins. Describe current diet regimen: Low carbohydrate, low fat, high protein diet with regular meals.   Past Medical History  Diagnosis Date  . Allergy   . Blood transfusion without reported diagnosis   . Thyroid disease     patient was cleared by specialist. History of Thyroid nodules.  . Absolute anemia 12/29/2012  . BP (high blood pressure) 12/29/2012    Patient Active Problem List   Diagnosis Date Noted  . Annual physical exam 11/10/2014  . Large breasts 11/10/2014  . Screening cholesterol level 11/10/2014  . Cervical cancer screening 11/10/2014  . Breast cancer screening 11/10/2014  . Multinodular goiter 09/28/2014  . Obesity, Class I, BMI 30-34.9 09/28/2014  . Hyperthyroidism, subclinical 09/28/2014  .  Fatigue 09/28/2014  . Allergic rhinitis, seasonal 09/28/2014  . Abnormal thyroid stimulating hormone (TSH) level 09/28/2014  . Extreme obesity ([place]) 05/24/2014  . Borderline diabetes 05/24/2014  . Hyperthyroidism 12/18/2013  . Absolute anemia 12/29/2012  . BP (high blood pressure) 12/29/2012  . Adiposity 12/29/2012  . Follow-up examination, following other surgery 11/19/2012    Social History  Substance Use Topics  . Smoking status: Never Smoker   . Smokeless tobacco: Not on file  . Alcohol Use: 0.0 oz/week    0 Standard drinks or equivalent per week     Comment: socially     Current outpatient prescriptions:  .  cyclobenzaprine (FLEXERIL) 10 MG tablet, Take 1 tablet (10 mg total) by mouth every 8 (eight) hours as needed for muscle spasms., Disp: 20 tablet, Rfl: 0 .  ferrous gluconate (FERGON) 324 MG tablet, Take 324 mg by mouth., Disp: , Rfl:  .  fluticasone (FLONASE) 50 MCG/ACT nasal spray, Place into the nose., Disp: , Rfl:  .  hydrochlorothiazide (HYDRODIURIL) 12.5 MG tablet, Take 12.5 mg by mouth., Disp: , Rfl:  .  ibuprofen (ADVIL,MOTRIN) 600 MG tablet, Take 1 tablet (600 mg total) by mouth every 8 (eight) hours as needed., Disp: 20 tablet, Rfl: 0 .  phentermine 37.5 MG capsule, Take 1 capsule (37.5 mg total) by mouth daily., Disp: 30 capsule, Rfl: 0  Past Surgical History  Procedure Laterality Date  . Abdominal hysterectomy      patient still has ovaries  . Cholecystectomy    . Shoulder arthroscopy Right   . Tubal ligation  No family history on file.  Allergies  Allergen Reactions  . Percocet [Oxycodone-Acetaminophen] Anaphylaxis     Review of Systems  CONSTITUTIONAL: No significant weight changes, fever, chills, weakness or fatigue.  HEENT:  - Eyes: No visual changes.  - Ears: No auditory changes. No pain.  - Nose: No sneezing, congestion, runny nose. - Throat: No sore throat. No changes in swallowing. SKIN: No rash or itching.  CARDIOVASCULAR: No  chest pain, chest pressure or chest discomfort. No palpitations or edema.  RESPIRATORY: No shortness of breath, cough or sputum.  NEUROLOGICAL: No headache, dizziness, syncope, paralysis, ataxia, numbness or tingling in the extremities. No memory changes. No change in bowel or bladder control.  MUSCULOSKELETAL: No joint pain. No muscle pain. HEMATOLOGIC: No anemia, bleeding or bruising.  LYMPHATICS: No enlarged lymph nodes.  PSYCHIATRIC: No change in mood. No change in sleep pattern.  ENDOCRINOLOGIC: No reports of sweating, cold or heat intolerance. No polyuria or polydipsia.     Objective  BP 134/88 mmHg  Pulse 93  Temp(Src) 98.1 F (36.7 C)  Resp 16  Ht 5\' 2"  (1.575 m)  Wt 211 lb 4 oz (95.822 kg)  BMI 38.63 kg/m2  SpO2 98% Body mass index is 38.63 kg/(m^2).  Physical Exam  Constitutional: Patient i obese and well-nourished. In no distress.  HEENT:  - Head: Normocephalic and atraumatic.  - Ears: Bilateral TMs gray, no erythema or effusion - Nose: Nasal mucosa moist - Mouth/Throat: Oropharynx is clear and moist. No tonsillar hypertrophy or erythema. No post nasal drainage.  - Eyes: Conjunctivae clear, EOM movements normal. PERRLA. No scleral icterus.  Neck: Normal range of motion. Neck supple. No JVD present. No thyromegaly present.  Cardiovascular: Normal rate, regular rhythm and normal heart sounds.  No murmur heard.  Pulmonary/Chest: Effort normal and breath sounds normal. No respiratory distress. Musculoskeletal: Normal range of motion bilateral UE and LE, no joint effusions. Peripheral vascular: Bilateral LE no edema. Neurological: CN II-XII grossly intact with no focal deficits. Alert and oriented to person, place, and time. Coordination, balance, strength, speech and gait are normal.  Skin: Skin is warm and dry. No rash noted. No erythema.  Psychiatric: Patient has a normal mood and affect. Behavior is normal in office today. Judgment and thought content normal in  office today.     Assessment & Plan  1. Obesity, Class II, BMI 35-39.9, with comorbidity ([place]) The patient has been counseled on their higher than normal BMI.  They have verbally expressed understanding their increased risk for other diseases.  In efforts to meet a better target BMI goal the patient has been counseled on lifestyle, diet and exercise modification tactics. Start with moderate intensity aerobic exercise (walking, jogging, elliptical, swimming, group or individual sports, hiking) at least 4mins a day at least 4 days a week and increase intensity, duration, frequency as tolerated. Diet should include well balance fresh fruits and vegetables avoiding processed foods, carbohydrates and sugars. Drink at least 8oz 10 glasses a day avoiding sodas, sugary fruit drinks, sweetened tea. Check weight on a reliable scale daily and monitor weight loss progress daily. Consider investing in mobile phone apps that will help keep track of weight loss goals.  - phentermine 37.5 MG capsule; Take 1 capsule (37.5 mg total) by mouth daily.  Dispense: 30 capsule; Refill: 0 - phentermine 37.5 MG capsule; Take 1 capsule (37.5 mg total) by mouth daily.  Dispense: 30 capsule; Refill: 0 - phentermine 37.5 MG capsule; Take 1 capsule (37.5 mg total) by  mouth daily.  Dispense: 30 capsule; Refill: 0  2. Hypertension goal BP (blood pressure) < 140/90 Increased HCTZ to 25 mg.  - hydrochlorothiazide (HYDRODIURIL) 25 MG tablet; Take 1 tablet (25 mg total) by mouth daily.  Dispense: 90 tablet; Refill: 3  3. Iron deficiency anemia Continue daily supplement.  - ferrous gluconate (FERGON) 324 MG tablet; Take 1 tablet (324 mg total) by mouth daily with breakfast.  Dispense: 90 tablet; Refill: 3

## 2015-05-09 ENCOUNTER — Other Ambulatory Visit: Payer: Self-pay | Admitting: Family Medicine

## 2015-05-09 DIAGNOSIS — IMO0001 Reserved for inherently not codable concepts without codable children: Secondary | ICD-10-CM

## 2015-05-09 NOTE — Telephone Encounter (Signed)
Patient is requesting refill on Adipex. She had appointment scheduled for february and we did schedule her one for this Friday just in case you said she had to be seen.

## 2015-05-10 MED ORDER — PHENTERMINE HCL 37.5 MG PO CAPS: 37.5000 mg | ORAL_CAPSULE | Freq: Every day | ORAL | Status: DC

## 2015-05-13 ENCOUNTER — Ambulatory Visit: Payer: Medicaid Other | Admitting: Family Medicine

## 2015-05-23 ENCOUNTER — Telehealth: Payer: Self-pay

## 2015-05-23 NOTE — Telephone Encounter (Signed)
Patient called call a nurse over the weekend for ear pain.  I tried to call today to check on patient, but voicemail is full.  Patient will need apointment if she calls back.

## 2015-06-06 ENCOUNTER — Ambulatory Visit: Payer: Medicaid Other | Admitting: Family Medicine

## 2015-06-13 ENCOUNTER — Ambulatory Visit: Payer: Medicaid Other | Admitting: Family Medicine

## 2015-06-15 ENCOUNTER — Encounter: Payer: Self-pay | Admitting: Family Medicine

## 2015-06-15 ENCOUNTER — Ambulatory Visit (INDEPENDENT_AMBULATORY_CARE_PROVIDER_SITE_OTHER): Payer: Medicaid Other | Admitting: Family Medicine

## 2015-06-15 VITALS — BP 122/84 | HR 102 | Temp 98.7°F | Resp 14 | Wt 212.0 lb

## 2015-06-15 DIAGNOSIS — J302 Other seasonal allergic rhinitis: Secondary | ICD-10-CM | POA: Diagnosis not present

## 2015-06-15 DIAGNOSIS — I10 Essential (primary) hypertension: Secondary | ICD-10-CM | POA: Diagnosis not present

## 2015-06-15 DIAGNOSIS — H6692 Otitis media, unspecified, left ear: Secondary | ICD-10-CM

## 2015-06-15 DIAGNOSIS — IMO0001 Reserved for inherently not codable concepts without codable children: Secondary | ICD-10-CM

## 2015-06-15 DIAGNOSIS — H6592 Unspecified nonsuppurative otitis media, left ear: Secondary | ICD-10-CM | POA: Insufficient documentation

## 2015-06-15 MED ORDER — FLUTICASONE PROPIONATE 50 MCG/ACT NA SUSP: 2.0000 | Freq: Every day | NASAL | Status: DC

## 2015-06-15 MED ORDER — PHENTERMINE HCL 37.5 MG PO CAPS: 37.5000 mg | ORAL_CAPSULE | Freq: Every day | ORAL | Status: DC

## 2015-06-15 MED ORDER — AMOXICILLIN-POT CLAVULANATE 875-125 MG PO TABS: 1.0000 | ORAL_TABLET | Freq: Two times a day (BID) | ORAL | Status: DC

## 2015-06-15 NOTE — Progress Notes (Signed)
Name: Wendy Kent   MRN: [id number]    DOB: 1970/06/02   Date:06/15/2015       Progress Note  Subjective  Chief Complaint  Chief Complaint  Patient presents with  . Hypertension  . Obesity  . Ear Fullness    onset 1 week left ear    HPI  Wendy Kent is here for routine follow up of Obesity. The patient is appropriately concerned about their weight. Onset of weight issues started many years ago. Highest recorded weight 222 lbs. Last weight 3 months ago was 212 lbs and today is 211 lbs. Associated symptoms or diseases include pain in weight bearing joints, HTN and do not include HLD, DMII, skin lesions, depression, poor self esteem, snoring, sleep apnea at this time. Current efforts to reduce weight include low carbohydrate, low fat diet with regular exercise and prescription phentermine. No side effects reported from the phentermine. She feels today that she has hit a plateau with her weight loss. She is busy with school, work, her family. Related conditions include HTN, which is well controlled on HCTZ most days but she feels her blood pressure might be fluctuating higher when she is stressed. Taking iron supplement for iron deficiency anemia, inconsistently.  Describe current exercise regimen: walking 3days a week for 30-36mins. Describe current diet regimen: Low carbohydrate, low fat, high protein diet with regular meals.   Today she also complains of fullness in left ear without drainage, fevers, hearing changes. Slight pain radiating down to jawline. Onset 1 week ago. Still using flonase but not an antihistamine regularly.   Past Medical History  Diagnosis Date  . Allergy   . Blood transfusion without reported diagnosis   . Thyroid disease     patient was cleared by specialist. History of Thyroid nodules.  . Absolute anemia 12/29/2012  . BP (high blood pressure) 12/29/2012    Patient Active Problem List   Diagnosis Date Noted  . Large breasts 11/10/2014  . Screening  cholesterol level 11/10/2014  . Multinodular goiter 09/28/2014  . Obesity, Class II, BMI 35-39.9, with comorbidity ([place]) 09/28/2014  . Hyperthyroidism, subclinical 09/28/2014  . Fatigue 09/28/2014  . Allergic rhinitis, seasonal 09/28/2014  . Abnormal thyroid stimulating hormone (TSH) level 09/28/2014  . Extreme obesity ([place]) 05/24/2014  . Borderline diabetes 05/24/2014  . Iron deficiency anemia 12/29/2012  . Hypertension goal BP (blood pressure) < 140/90 12/29/2012    Social History  Substance Use Topics  . Smoking status: Never Smoker   . Smokeless tobacco: Not on file  . Alcohol Use: 0.0 oz/week    0 Standard drinks or equivalent per week     Comment: socially     Current outpatient prescriptions:  .  fluticasone (FLONASE) 50 MCG/ACT nasal spray, Place into the nose., Disp: , Rfl:  .  hydrochlorothiazide (HYDRODIURIL) 25 MG tablet, Take 1 tablet (25 mg total) by mouth daily., Disp: 90 tablet, Rfl: 3 .  ibuprofen (ADVIL,MOTRIN) 600 MG tablet, Take 1 tablet (600 mg total) by mouth every 8 (eight) hours as needed., Disp: 20 tablet, Rfl: 0 .  phentermine 37.5 MG capsule, Take 1 capsule (37.5 mg total) by mouth daily., Disp: 30 capsule, Rfl: 0 .  phentermine 37.5 MG capsule, Take 1 capsule (37.5 mg total) by mouth daily., Disp: 30 capsule, Rfl: 0 .  phentermine 37.5 MG capsule, Take 1 capsule (37.5 mg total) by mouth daily., Disp: 30 capsule, Rfl: 0  Past Surgical History  Procedure Laterality Date  . Abdominal hysterectomy  patient still has ovaries  . Cholecystectomy    . Shoulder arthroscopy Right   . Tubal ligation      No family history on file.  Allergies  Allergen Reactions  . Percocet [Oxycodone-Acetaminophen] Anaphylaxis     Review of Systems  CONSTITUTIONAL: No significant weight changes, fever, chills, weakness or fatigue.  HEENT:  - Eyes: No visual changes.  - Ears: No auditory changes. Yes left slight pain. Left ear fullness sensation. - Nose: No  sneezing, congestion, runny nose. - Throat: No sore throat. No changes in swallowing. SKIN: No rash or itching.  CARDIOVASCULAR: No chest pain, chest pressure or chest discomfort. No palpitations or edema.  RESPIRATORY: No shortness of breath, cough or sputum.  GASTROINTESTINAL: No anorexia, nausea, vomiting. No changes in bowel habits. No abdominal pain or blood.  NEUROLOGICAL: No headache, dizziness, syncope, paralysis, ataxia, numbness or tingling in the extremities. No memory changes. No change in bowel or bladder control.  MUSCULOSKELETAL: No joint pain. No muscle pain. HEMATOLOGIC: No anemia, bleeding or bruising.  LYMPHATICS: No enlarged lymph nodes.  PSYCHIATRIC: No change in mood. No change in sleep pattern.  ENDOCRINOLOGIC: No reports of sweating, cold or heat intolerance. No polyuria or polydipsia.     Objective  BP 122/84 mmHg  Pulse 102  Temp(Src) 98.7 F (37.1 C) (Oral)  Resp 14  Wt 212 lb (96.163 kg)  SpO2 95% Body mass index is 38.77 kg/(m^2).  Physical Exam  Constitutional: Patient is obese and well-nourished. In no distress.  HEENT:  - Head: Normocephalic and atraumatic.  - Ears: Right TM gray, no erythema or effusion. Left TM errythma, effusion, bulging. No pain over mastoid bone. - Nose: Nasal mucosa moist - Mouth/Throat: Oropharynx is clear and moist. No tonsillar hypertrophy or erythema. No post nasal drainage.  - Eyes: Conjunctivae clear, EOM movements normal. PERRLA. No scleral icterus.  Neck: Normal range of motion. Neck supple. No JVD present. Mild thyromegaly present.  Cardiovascular: Normal rate, regular rhythm and normal heart sounds.  No murmur heard.  Pulmonary/Chest: Effort normal and breath sounds normal. No respiratory distress. Musculoskeletal: Normal range of motion bilateral UE and LE, no joint effusions. Peripheral vascular: Bilateral LE no edema. Neurological: CN II-XII grossly intact with no focal deficits. Alert and oriented to person,  place, and time. Coordination, balance, strength, speech and gait are normal.  Skin: Skin is warm and dry. No rash noted. No erythema.  Psychiatric: Patient has a normal mood and affect. Behavior is normal in office today. Judgment and thought content normal in office today.   Assessment & Plan   1. Hypertension goal BP (blood pressure) < 140/90 Well controled, has refills.   2. Obesity, Class II, BMI 35-39.9, with comorbidity ([place]) I do not recommend further use of Adipex. A 1 month supply provided and it will be the last RX as I do not advised chronic use of Adipex due to cardiac consequences. She voices understanding and will continue with exercise and lifestyle efforts.   - phentermine 37.5 MG capsule; Take 1 capsule (37.5 mg total) by mouth daily.  Dispense: 30 capsule; Refill: 0  3. Allergic rhinitis, seasonal Start anti-histamine again as well.  - fluticasone (FLONASE) 50 MCG/ACT nasal spray; Place 2 sprays into both nostrils daily.  Dispense: 16 g; Refill: 2  4. Left otitis media with effusion Start abx.  - amoxicillin-clavulanate (AUGMENTIN) 875-125 MG tablet; Take 1 tablet by mouth 2 (two) times daily.  Dispense: 20 tablet; Refill: 0

## 2015-08-07 ENCOUNTER — Encounter: Payer: Self-pay | Admitting: Emergency Medicine

## 2015-08-07 ENCOUNTER — Emergency Department
Admission: EM | Admit: 2015-08-07 | Discharge: 2015-08-07 | Disposition: A | Discharge status: Home / Self Care | Payer: Medicaid Other | Attending: Emergency Medicine | Admitting: Emergency Medicine

## 2015-08-07 ENCOUNTER — Emergency Department: Payer: Medicaid Other

## 2015-08-07 DIAGNOSIS — I10 Essential (primary) hypertension: Secondary | ICD-10-CM | POA: Insufficient documentation

## 2015-08-07 DIAGNOSIS — E669 Obesity, unspecified: Secondary | ICD-10-CM | POA: Diagnosis not present

## 2015-08-07 DIAGNOSIS — E079 Disorder of thyroid, unspecified: Secondary | ICD-10-CM | POA: Diagnosis not present

## 2015-08-07 DIAGNOSIS — Z79899 Other long term (current) drug therapy: Secondary | ICD-10-CM | POA: Diagnosis not present

## 2015-08-07 DIAGNOSIS — E0590 Thyrotoxicosis, unspecified without thyrotoxic crisis or storm: Secondary | ICD-10-CM | POA: Insufficient documentation

## 2015-08-07 DIAGNOSIS — R109 Unspecified abdominal pain: Secondary | ICD-10-CM

## 2015-08-07 DIAGNOSIS — K5900 Constipation, unspecified: Secondary | ICD-10-CM

## 2015-08-07 LAB — CBC
HCT: 38.4 % (ref 35.0–47.0)
Hemoglobin: 13.1 g/dL (ref 12.0–16.0)
MCH: 29.2 pg (ref 26.0–34.0)
MCHC: 34.1 g/dL (ref 32.0–36.0)
MCV: 85.8 fL (ref 80.0–100.0)
Platelets: 298 10*3/uL (ref 150–440)
RBC: 4.47 MIL/uL (ref 3.80–5.20)
RDW: 15.6 % — ABNORMAL HIGH (ref 11.5–14.5)
WBC: 7.8 10*3/uL (ref 3.6–11.0)

## 2015-08-07 LAB — URINALYSIS COMPLETE WITH MICROSCOPIC (ARMC ONLY)
Bacteria, UA: NONE SEEN
Bilirubin Urine: NEGATIVE
Glucose, UA: NEGATIVE mg/dL
Ketones, ur: NEGATIVE mg/dL
Nitrite: NEGATIVE
Protein, ur: NEGATIVE mg/dL
Specific Gravity, Urine: 1.02 (ref 1.005–1.030)
pH: 6 (ref 5.0–8.0)

## 2015-08-07 LAB — COMPREHENSIVE METABOLIC PANEL
ALT: 17 U/L (ref 14–54)
AST: 20 U/L (ref 15–41)
Albumin: 3.9 g/dL (ref 3.5–5.0)
Alkaline Phosphatase: 60 U/L (ref 38–126)
Anion gap: 6 (ref 5–15)
BUN: 9 mg/dL (ref 6–20)
CO2: 27 mmol/L (ref 22–32)
Calcium: 8.8 mg/dL — ABNORMAL LOW (ref 8.9–10.3)
Chloride: 103 mmol/L (ref 101–111)
Creatinine, Ser: 0.58 mg/dL (ref 0.44–1.00)
GFR calc Af Amer: >60 mL/min (ref >60)
GFR calc non Af Amer: >60 mL/min (ref >60)
Glucose, Bld: 94 mg/dL (ref 65–99)
Potassium: 3.2 mmol/L — ABNORMAL LOW (ref 3.5–5.1)
Sodium: 136 mmol/L (ref 135–145)
Total Bilirubin: 0.7 mg/dL (ref 0.3–1.2)
Total Protein: 7.6 g/dL (ref 6.5–8.1)

## 2015-08-07 LAB — POCT PREGNANCY, URINE: Preg Test, Ur: NEGATIVE

## 2015-08-07 LAB — LIPASE, BLOOD: Lipase: 19 U/L (ref 11–51)

## 2015-08-07 MED ORDER — KETOROLAC TROMETHAMINE 30 MG/ML IJ SOLN
30.0000 mg | Freq: Once | INTRAMUSCULAR | Status: AC
  Administered 2015-08-07: 30 mg via INTRAMUSCULAR
  Filled 2015-08-07: qty 1

## 2015-08-07 MED ORDER — SODIUM CHLORIDE 0.9 % IV SOLN
1000.0000 mL | Freq: Once | INTRAVENOUS | Status: AC
  Administered 2015-08-07: 1000 mL via INTRAVENOUS

## 2015-08-07 NOTE — ED Provider Notes (Signed)
[facility] Emergency Department Provider Note  ____________________________________________    I have reviewed the triage vital signs and the nursing notes.   HISTORY  Chief Complaint Abdominal Pain    HPI Wendy Kent is a 45 y.o. female who presents with complaints of right flank pain. Patient reports the pain has been relatively constant since Friday. She has also had mild nausea. She has not vomited today but did vomit once yesterday. No fevers or chills. No dysuria. She has a history of a hysterectomy. She has had a cholecystectomy. She reports normal bowel movements. No history of kidney stones     Past Medical History  Diagnosis Date  . Allergy   . Blood transfusion without reported diagnosis   . Thyroid disease     patient was cleared by specialist. History of Thyroid nodules.  . Absolute anemia 12/29/2012  . BP (high blood pressure) 12/29/2012    Patient Active Problem List   Diagnosis Date Noted  . Left otitis media with effusion 06/15/2015  . Large breasts 11/10/2014  . Screening cholesterol level 11/10/2014  . Multinodular goiter 09/28/2014  . Obesity, Class II, BMI 35-39.9, with comorbidity ([place]) 09/28/2014  . Hyperthyroidism, subclinical 09/28/2014  . Fatigue 09/28/2014  . Allergic rhinitis, seasonal 09/28/2014  . Abnormal thyroid stimulating hormone (TSH) level 09/28/2014  . Extreme obesity ([place]) 05/24/2014  . Borderline diabetes 05/24/2014  . Iron deficiency anemia 12/29/2012  . Hypertension goal BP (blood pressure) < 140/90 12/29/2012    Past Surgical History  Procedure Laterality Date  . Abdominal hysterectomy      patient still has ovaries  . Cholecystectomy    . Shoulder arthroscopy Right   . Tubal ligation      Current Outpatient Rx  Name  Route  Sig  Dispense  Refill  . amoxicillin-clavulanate (AUGMENTIN) 875-125 MG tablet   Oral   Take 1 tablet by mouth 2 (two) times daily.   20 tablet   0   . fluticasone  (FLONASE) 50 MCG/ACT nasal spray   Each Nare   Place 2 sprays into both nostrils daily.   16 g   2   . hydrochlorothiazide (HYDRODIURIL) 25 MG tablet   Oral   Take 1 tablet (25 mg total) by mouth daily.   90 tablet   3   . ibuprofen (ADVIL,MOTRIN) 600 MG tablet   Oral   Take 1 tablet (600 mg total) by mouth every 8 (eight) hours as needed.   20 tablet   0   . phentermine 37.5 MG capsule   Oral   Take 1 capsule (37.5 mg total) by mouth daily.   30 capsule   0     Refill 06/15/15     Allergies Percocet  No family history on file.  Social History Social History  Substance Use Topics  . Smoking status: Never Smoker   . Smokeless tobacco: None  . Alcohol Use: 0.0 oz/week    0 Standard drinks or equivalent per week     Comment: socially    Review of Systems  Constitutional: Negative for fever. Eyes: Negative for redness ENT: Negative for sore throat Cardiovascular: Negative for chest pain Respiratory: Negative for shortness of breath. Gastrointestinal: As above Genitourinary: Negative for dysuria. No hematuria Musculoskeletal: Negative for back pain. Skin: Negative for rash. Neurological: Negative for focal weakness Psychiatric: no anxiety    ____________________________________________   PHYSICAL EXAM:  VITAL SIGNS: ED Triage Vitals  Enc Vitals Group     BP  08/07/15 0831 121/81 mmHg     Pulse Rate 08/07/15 0831 84     Resp 08/07/15 0831 18     Temp 08/07/15 0831 98.3 F (36.8 C)     Temp Source 08/07/15 0831 Oral     SpO2 08/07/15 0831 100 %     Weight 08/07/15 0831 210 lb (95.255 kg)     Height 08/07/15 0831 5\' 2"  (1.575 m)     Head Cir --      Peak Flow --      Pain Score 08/07/15 0832 8     Pain Loc --      Pain Edu? --      Excl. in [place]? --      Constitutional: Alert and oriented. Well appearing and in no distress. Pleasant and interactive Eyes: Conjunctivae are normal. No erythema or injection ENT   Head: Normocephalic and  atraumatic.   Mouth/Throat: Mucous membranes are moist. Cardiovascular: Normal rate, regular rhythm.  Respiratory: Normal respiratory effort without tachypnea nor retractions.  Gastrointestinal: Soft and non-tender in all quadrants. No distention. There is no CVA tenderness. Genitourinary: deferred Musculoskeletal: Nontender with normal range of motion in all extremities. No lower extremity tenderness nor edema. Neurologic:  Normal speech and language. No gross focal neurologic deficits are appreciated. Skin:  Skin is warm, dry and intact. No rash noted. Psychiatric: Mood and affect are normal. Patient exhibits appropriate insight and judgment.  ____________________________________________    LABS (pertinent positives/negatives)  Labs Reviewed  COMPREHENSIVE METABOLIC PANEL - Abnormal; Notable for the following:    Potassium 3.2 (*)    Calcium 8.8 (*)    All other components within normal limits  CBC - Abnormal; Notable for the following:    RDW 15.6 (*)    All other components within normal limits  URINALYSIS COMPLETEWITH MICROSCOPIC ([facility] ONLY) - Abnormal; Notable for the following:    Color, Urine YELLOW (*)    APPearance CLEAR (*)    Hgb urine dipstick 1+ (*)    Leukocytes, UA TRACE (*)    Squamous Epithelial / LPF 0-5 (*)    All other components within normal limits  LIPASE, BLOOD  POCT PREGNANCY, URINE    ____________________________________________   EKG  None  ____________________________________________    RADIOLOGY  CT renal stone study shows no ureteral stone  ____________________________________________   PROCEDURES  Procedure(s) performed: none  Critical Care performed: none  ____________________________________________   INITIAL IMPRESSION / ASSESSMENT AND PLAN / ED COURSE  Pertinent labs & imaging results that were available during my care of the patient were reviewed by me and considered in my medical decision making (see chart for  details).  Patient presents with right flank pain, mild nausea and hematuria on urinalysis. We will send for CT renal stone study to evaluate for possible kidney stone.   ----------------------------------------- 11:21 AM on 08/07/2015 -----------------------------------------  CT renal stone study demonstrated no kidney stone. Possible constipation as a cause of her discomfort  ____________________________________________   FINAL CLINICAL IMPRESSION(S) / ED DIAGNOSES  Final diagnoses:  Flank pain  Constipation, unspecified constipation type          Lavonia Drafts, MD 08/07/15 [phone]

## 2015-08-07 NOTE — ED Notes (Signed)
C/o right upper quadrant pain.  Pain worse at night.  Nausea associated with pain.  Has been drinking water and ginger ale since onset of symptoms due to nausea.  Vomited once yesterday.

## 2015-08-07 NOTE — ED Notes (Signed)
Dr Kinner at bedside. 

## 2015-08-07 NOTE — Discharge Instructions (Signed)

## 2015-08-07 NOTE — ED Notes (Signed)
Patient transported to CT 

## 2015-09-26 ENCOUNTER — Ambulatory Visit: Payer: Medicaid Other | Admitting: Family Medicine

## 2015-09-26 ENCOUNTER — Encounter: Payer: Self-pay | Admitting: Family Medicine

## 2015-09-26 ENCOUNTER — Ambulatory Visit (INDEPENDENT_AMBULATORY_CARE_PROVIDER_SITE_OTHER): Payer: Medicaid Other | Admitting: Family Medicine

## 2015-09-26 VITALS — BP 118/80 | HR 100 | Temp 98.8°F | Resp 18 | Ht 62.0 in | Wt 220.4 lb

## 2015-09-26 DIAGNOSIS — K589 Irritable bowel syndrome without diarrhea: Secondary | ICD-10-CM | POA: Diagnosis not present

## 2015-09-26 DIAGNOSIS — I10 Essential (primary) hypertension: Secondary | ICD-10-CM | POA: Diagnosis not present

## 2015-09-26 DIAGNOSIS — R1030 Lower abdominal pain, unspecified: Secondary | ICD-10-CM | POA: Diagnosis not present

## 2015-09-26 DIAGNOSIS — E739 Lactose intolerance, unspecified: Secondary | ICD-10-CM

## 2015-09-26 NOTE — Progress Notes (Signed)
Date:  09/26/2015   Name:  Wendy Kent   DOB:  Dec 01, 1970   MRN:  [id number]  PCP:  Enid Derry, MD    Chief Complaint: Abdominal Pain   History of Present Illness:  This is a 45 y.o. female with 3 wk hx intermittent bloating abdominal pain, seen [facility] ED where CT showed constipation, mg citrate caused loose stools but pain persists, omeprazole also no help. Pain better after BM, not affected by eating, non-positional, getting worse. S/p cholecystectomy and hysterectomy, no recent dietary change, taking phentermine only prn. Known lactose intolerance, avoids milk  Review of Systems:  Review of Systems  Constitutional: Negative for fever and chills.  Respiratory: Negative for cough and shortness of breath.   Cardiovascular: Negative for chest pain and leg swelling.  Gastrointestinal: Negative for nausea, vomiting and blood in stool.  Genitourinary: Negative for dysuria and vaginal discharge.    Patient Active Problem List   Diagnosis Date Noted  . Left otitis media with effusion 06/15/2015  . Large breasts 11/10/2014  . Multinodular goiter 09/28/2014  . Hyperthyroidism, subclinical 09/28/2014  . Fatigue 09/28/2014  . Allergic rhinitis, seasonal 09/28/2014  . Obesity, Class III, BMI 40-49.9 (morbid obesity) ([place]) 05/24/2014  . Borderline diabetes 05/24/2014  . Iron deficiency anemia 12/29/2012  . Hypertension goal BP (blood pressure) < 140/90 12/29/2012    Prior to Admission medications   Medication Sig Start Date End Date Taking? Authorizing Provider  fluticasone (FLONASE) 50 MCG/ACT nasal spray Place 2 sprays into both nostrils daily. 06/15/15   Bobetta Lime, MD  hydrochlorothiazide (HYDRODIURIL) 25 MG tablet Take 1 tablet (25 mg total) by mouth daily. 02/01/15   Bobetta Lime, MD  ibuprofen (ADVIL,MOTRIN) 600 MG tablet Take 1 tablet (600 mg total) by mouth every 8 (eight) hours as needed. 10/17/14   Lisa Roca, MD  phentermine 37.5 MG capsule Take 1 capsule (37.5 mg  total) by mouth daily. 06/15/15   Bobetta Lime, MD    Allergies  Allergen Reactions  . Percocet [Oxycodone-Acetaminophen] Anaphylaxis    Past Surgical History  Procedure Laterality Date  . Abdominal hysterectomy      patient still has ovaries  . Cholecystectomy    . Shoulder arthroscopy Right   . Tubal ligation      Social History  Substance Use Topics  . Smoking status: Never Smoker   . Smokeless tobacco: None  . Alcohol Use: 0.0 oz/week    0 Standard drinks or equivalent per week     Comment: socially    No family history on file.  Medication list has been reviewed and updated.  Physical Examination: BP 118/80 mmHg  Pulse 100  Temp(Src) 98.8 F (37.1 C)  Resp 18  Ht 5\' 2"  (1.575 m)  Wt 220 lb 6 oz (99.961 kg)  BMI 40.30 kg/m2  SpO2 97%  Physical Exam  Constitutional: She appears well-developed and well-nourished.  Cardiovascular: Normal rate, regular rhythm and normal heart sounds.   Pulmonary/Chest: Effort normal and breath sounds normal.  Abdominal: Soft. Bowel sounds are normal. She exhibits no distension and no mass. There is no rebound and no guarding.  Mild suprapubic tenderness  Musculoskeletal: She exhibits no edema.  Neurological: She is alert.  Skin: Skin is warm and dry.  Psychiatric: She has a normal mood and affect. Her behavior is normal.  Nursing note and vitals reviewed.   Assessment and Plan:  1. IBS (irritable bowel syndrome) Citrucel bid x 2 week trial  2. Abdominal pain, suprapubic, unspecified laterality  UA negative - POCT Urinalysis Dipstick  3. Hypertension goal BP (blood pressure) < 140/90 Well controlled on HCTZ  4. Lactose intolerance Lactaid in place of milk  Return if symptoms worsen or fail to improve.  Satira Anis. [facility]  09/26/2015

## 2015-11-10 ENCOUNTER — Encounter: Payer: Self-pay | Admitting: Family Medicine

## 2015-11-10 ENCOUNTER — Ambulatory Visit (INDEPENDENT_AMBULATORY_CARE_PROVIDER_SITE_OTHER): Payer: Medicaid Other | Admitting: Family Medicine

## 2015-11-10 VITALS — BP 116/82 | HR 94 | Temp 98.3°F | Resp 14 | Wt 225.0 lb

## 2015-11-10 DIAGNOSIS — E876 Hypokalemia: Secondary | ICD-10-CM

## 2015-11-10 DIAGNOSIS — N62 Hypertrophy of breast: Secondary | ICD-10-CM | POA: Diagnosis not present

## 2015-11-10 MED ORDER — POTASSIUM CHLORIDE ER 10 MEQ PO TBCR: EXTENDED_RELEASE_TABLET | ORAL | Status: DC

## 2015-11-10 NOTE — Progress Notes (Signed)
BP 116/82   Pulse 94   Temp 98.3 F (36.8 C) (Oral)   Resp 14   Wt 225 lb (102.1 kg)   SpO2 98%   BMI 41.15 kg/m     Subjective:    Patient ID: Wendy Kent, female    DOB: [birth year], 45 y.o.   MRN: [id number]  HPI: Wendy Kent is a 45 y.o. female  Chief Complaint  Patient presents with  . Medication Refill  . breast reduction   She is here to see if she can get a prescription for phentermine She used to take it; she has seen bragg's vinegar about adding to her intake for weight loss, but she has never tried it She is doing a lot of walking; she goes 2.2 miles a day, good for your heart she says; she tries to go every day Gets a sweat going; going to get her membership at the [employer] adult weight more than today, but just not sure how much Since her hysterectomy, trouble with belly fat since then I asked about family hx of weight issues; her mother was tall and slim; brother and sister tall and slim; at age 61, she weighed "way less than this"; slow and gradual weight gain She is not sure how many calories she eats; "I have a bad eating habit"; some days she may not eat until 2 or 3 pm when she takes her BP medicine She says "my habits are terrible" She does drink water all day Did have thyroid checked and it was normal She has not met with a nutritionist  She has always had heavy breasts; she is a G cup; she has had right shoulder surgery and she knows the weight of the right breast in particular is causing symptoms and pain; her upper back hurts; she thinks that breasts are interfering with exercising; she has grooves in shoulders from bra straps; lower back is not really the issue, it's mid and upper back from heavy breasts  Hx of multinodular goiter; went to [facility] and they did biopsy and everything was fine, nodules were not cancer  Amazing cholesterol panel reviewed from 2016  Depression screen [facility] 2/9 12/22/2015 11/10/2015 06/15/2015 11/10/2014  Decreased Interest  0 0 0 0  Down, Depressed, Hopeless 0 0 1 0  PHQ - 2 Score 0 0 1 0    Relevant past medical, surgical, family and social history reviewed Past Medical History:  Diagnosis Date  . Absolute anemia 12/29/2012  . Allergy   . Blood transfusion without reported diagnosis   . BP (high blood pressure) 12/29/2012  . Breast hypertrophy 11/10/2014  . Thyroid disease    patient was cleared by specialist. History of Thyroid nodules.   Past Surgical History:  Procedure Laterality Date  . ABDOMINAL HYSTERECTOMY     patient still has ovaries  . CHOLECYSTECTOMY    . SHOULDER ARTHROSCOPY Right   . TUBAL LIGATION     History reviewed. No pertinent family history. Social History  Substance Use Topics  . Smoking status: Never Smoker  . Smokeless tobacco: Not on file  . Alcohol use 0.0 oz/week     Comment: socially   Interim medical history since last visit reviewed. Allergies and medications reviewed  Review of Systems Per HPI unless specifically indicated above     Objective:    BP 116/82   Pulse 94   Temp 98.3 F (36.8 C) (Oral)   Resp 14   Wt 225 lb (102.1 kg)  SpO2 98%   BMI 41.15 kg/m    Wt Readings from Last 3 Encounters:  12/22/15 232 lb (105.2 kg)  11/10/15 225 lb (102.1 kg)  09/26/15 220 lb 6 oz (100 kg)   body mass index is 41.15 kg/m.   Physical Exam  Constitutional: She appears well-developed and well-nourished.  Morbidly obese; weight up 4+ pounds over last 6 weeks  Eyes: No scleral icterus.  Neck: No thyromegaly present.  Cardiovascular: Normal rate and regular rhythm.   Pulmonary/Chest: Effort normal and breath sounds normal.  Abdominal: She exhibits no distension.  Musculoskeletal: She exhibits no edema.  shoudlers rolled forward; grooves in shoulders  Neurological: She is alert.  Skin: No rash noted. No pallor.  Psychiatric: She has a normal mood and affect.   Results for orders placed or performed during the hospital encounter of 08/07/15  Lipase, blood   Result Value Ref Range   Lipase 19 11 - 51 U/L  Comprehensive metabolic panel  Result Value Ref Range   Sodium 136 135 - 145 mmol/L   Potassium 3.2 (L) 3.5 - 5.1 mmol/L   Chloride 103 101 - 111 mmol/L   CO2 27 22 - 32 mmol/L   Glucose, Bld 94 65 - 99 mg/dL   BUN 9 6 - 20 mg/dL   Creatinine, Ser 0.58 0.44 - 1.00 mg/dL   Calcium 8.8 (L) 8.9 - 10.3 mg/dL   Total Protein 7.6 6.5 - 8.1 g/dL   Albumin 3.9 3.5 - 5.0 g/dL   AST 20 15 - 41 U/L   ALT 17 14 - 54 U/L   Alkaline Phosphatase 60 38 - 126 U/L   Total Bilirubin 0.7 0.3 - 1.2 mg/dL   GFR calc non Af Amer >60 >60 mL/min   GFR calc Af Amer >60 >60 mL/min   Anion gap 6 5 - 15  CBC  Result Value Ref Range   WBC 7.8 3.6 - 11.0 K/uL   RBC 4.47 3.80 - 5.20 MIL/uL   Hemoglobin 13.1 12.0 - 16.0 g/dL   HCT 38.4 35.0 - 47.0 %   MCV 85.8 80.0 - 100.0 fL   MCH 29.2 26.0 - 34.0 pg   MCHC 34.1 32.0 - 36.0 g/dL   RDW 15.6 (H) 11.5 - 14.5 %   Platelets 298 150 - 440 K/uL  Urinalysis complete, with microscopic ([facility] only)  Result Value Ref Range   Color, Urine YELLOW (A) YELLOW   APPearance CLEAR (A) CLEAR   Glucose, UA NEGATIVE NEGATIVE mg/dL   Bilirubin Urine NEGATIVE NEGATIVE   Ketones, ur NEGATIVE NEGATIVE mg/dL   Specific Gravity, Urine 1.020 1.005 - 1.030   Hgb urine dipstick 1+ (A) NEGATIVE   pH 6.0 5.0 - 8.0   Protein, ur NEGATIVE NEGATIVE mg/dL   Nitrite NEGATIVE NEGATIVE   Leukocytes, UA TRACE (A) NEGATIVE   RBC / HPF 6-30 0 - 5 RBC/hpf   WBC, UA 0-5 0 - 5 WBC/hpf   Bacteria, UA NONE SEEN NONE SEEN   Squamous Epithelial / LPF 0-5 (A) NONE SEEN   Mucous PRESENT   Pregnancy, urine POC  Result Value Ref Range   Preg Test, Ur NEGATIVE NEGATIVE      Assessment & Plan:   Problem List Items Addressed This Visit      Other   Obesity, Class III, BMI 40-49.9 (morbid obesity) (HCC)    Talked with patient about weight loss; I do not prescribe plain phentermine; encouraged decresed portions, increased movement, hydration; see  AVS  Hypokalemia    Induced by diuretic; will start low-dose replacement three days per week, and encouraged her to get more potassium-rich foods as she changes her diet, fruits and veggies      Breast hypertrophy - Primary    Refer to plastic surgery; large breasts are causing pain and interfering with her ability to exercise      Relevant Orders   Ambulatory referral to Plastic Surgery (Completed)    Other Visit Diagnoses   None.     Follow up plan: Return in about 6 weeks (around 12/22/2015).  An after-visit summary was printed and given to the patient at [facility].  Please see the patient instructions which may contain other information and recommendations beyond what is mentioned above in the assessment and plan.  Meds ordered this encounter  Medications  . potassium chloride (K-DUR) 10 MEQ tablet    Sig: Take one by mouth three days per week    Dispense:  12 tablet    Refill:  3   Orders Placed This Encounter  Procedures  . Ambulatory referral to Plastic Surgery   Face-to-face time with patient was more than 25 minutes, >50% time spent counseling and coordination of care

## 2015-11-10 NOTE — Patient Instructions (Signed)
Start the low dose potassium three days a week We'll refer you to the plastic surgeon  Check out the information at familydoctor.org entitled "Nutrition for Weight Loss: What You Need to Know about Fad Diets" Try to lose between 1-2 pounds per week by taking in fewer calories and burning off more calories You can succeed by limiting portions, limiting foods dense in calories and fat, becoming more active, and drinking 8 glasses of water a day (64 ounces) Don't skip meals, especially breakfast, as skipping meals may alter your metabolism Do not use over-the-counter weight loss pills or gimmicks that claim rapid weight loss A healthy BMI (or body mass index) is between 18.5 and 24.9 You can calculate your ideal BMI at the [place] website ClubMonetize.fr

## 2015-11-10 NOTE — Assessment & Plan Note (Signed)
Refer to plastic surgery; large breasts are causing pain and interfering with her ability to exercise

## 2015-11-14 ENCOUNTER — Encounter: Payer: Medicaid Other | Admitting: Family Medicine

## 2015-12-22 ENCOUNTER — Ambulatory Visit (INDEPENDENT_AMBULATORY_CARE_PROVIDER_SITE_OTHER): Payer: Medicaid Other | Admitting: Family Medicine

## 2015-12-22 DIAGNOSIS — I517 Cardiomegaly: Secondary | ICD-10-CM

## 2015-12-22 DIAGNOSIS — E042 Nontoxic multinodular goiter: Secondary | ICD-10-CM

## 2015-12-22 DIAGNOSIS — R160 Hepatomegaly, not elsewhere classified: Secondary | ICD-10-CM

## 2015-12-22 DIAGNOSIS — I10 Essential (primary) hypertension: Secondary | ICD-10-CM

## 2015-12-22 DIAGNOSIS — D509 Iron deficiency anemia, unspecified: Secondary | ICD-10-CM

## 2015-12-22 DIAGNOSIS — E739 Lactose intolerance, unspecified: Secondary | ICD-10-CM | POA: Diagnosis not present

## 2015-12-22 DIAGNOSIS — R7303 Prediabetes: Secondary | ICD-10-CM

## 2015-12-22 DIAGNOSIS — E66813 Obesity, class 3: Secondary | ICD-10-CM

## 2015-12-22 LAB — CBC WITH DIFFERENTIAL/PLATELET
Basophils Absolute: 0 cells/uL (ref 0–200)
Basophils Relative: 0 %
Eosinophils Absolute: 91 cells/uL (ref 15–500)
Eosinophils Relative: 1 %
HCT: 37.8 % (ref 35.0–45.0)
Hemoglobin: 12.4 g/dL (ref 11.7–15.5)
Lymphocytes Relative: 23 %
Lymphs Abs: 2093 cells/uL (ref 850–3900)
MCH: 28.2 pg (ref 27.0–33.0)
MCHC: 32.8 g/dL (ref 32.0–36.0)
MCV: 86.1 fL (ref 80.0–100.0)
MPV: 9.9 fL (ref 7.5–12.5)
Monocytes Absolute: 364 cells/uL (ref 200–950)
Monocytes Relative: 4 %
Neutro Abs: 6552 cells/uL (ref 1500–7800)
Neutrophils Relative %: 72 %
Platelets: 349 10*3/uL (ref 140–400)
RBC: 4.39 MIL/uL (ref 3.80–5.10)
RDW: 15.7 % — ABNORMAL HIGH (ref 11.0–15.0)
WBC: 9.1 10*3/uL (ref 3.8–10.8)

## 2015-12-22 LAB — COMPLETE METABOLIC PANEL WITH GFR
ALT: 14 U/L (ref 6–29)
AST: 16 U/L (ref 10–35)
Albumin: 3.7 g/dL (ref 3.6–5.1)
Alkaline Phosphatase: 59 U/L (ref 33–115)
BUN: 14 mg/dL (ref 7–25)
CO2: 28 mmol/L (ref 20–31)
Calcium: 9.3 mg/dL (ref 8.6–10.2)
Chloride: 103 mmol/L (ref 98–110)
Creat: 0.74 mg/dL (ref 0.50–1.10)
GFR, Est African American: >89 mL/min (ref >=60)
GFR, Est Non African American: >89 mL/min (ref >=60)
Glucose, Bld: 94 mg/dL (ref 65–99)
Potassium: 4.1 mmol/L (ref 3.5–5.3)
Sodium: 141 mmol/L (ref 135–146)
Total Bilirubin: 0.3 mg/dL (ref 0.2–1.2)
Total Protein: 6.8 g/dL (ref 6.1–8.1)

## 2015-12-22 LAB — T3, FREE: T3, Free: 3 pg/mL (ref 2.3–4.2)

## 2015-12-22 LAB — T4, FREE: Free T4: 1 ng/dL (ref 0.8–1.8)

## 2015-12-22 LAB — TSH: TSH: 0.65 mIU/L

## 2015-12-22 NOTE — Assessment & Plan Note (Signed)
Check CBC 

## 2015-12-22 NOTE — Assessment & Plan Note (Signed)
Discussed options; Saxenda and Belviq and Contrave discussed; she'll see nutritionist; try [email or web address]/, increase activity like walking, work up to 150 minutes a week; consider pool therapy

## 2015-12-22 NOTE — Assessment & Plan Note (Signed)
Try DASH guidelines 

## 2015-12-22 NOTE — Assessment & Plan Note (Signed)
Check liver US; may be fatty liver

## 2015-12-22 NOTE — Patient Instructions (Signed)
We'll get labs today Check out the information at familydoctor.org entitled "Nutrition for Weight Loss: What You Need to Know about Fad Diets" Try to lose between 1-2 pounds per week by taking in fewer calories and burning off more calories You can succeed by limiting portions, limiting foods dense in calories and fat, becoming more active, and drinking 8 glasses of water a day (64 ounces) Don't skip meals, especially breakfast, as skipping meals may alter your metabolism Do not use over-the-counter weight loss pills or gimmicks that claim rapid weight loss A healthy BMI (or body mass index) is between 18.5 and 24.9 You can calculate your ideal BMI at the [place] website ClubMonetize.fr  Check out myfitnesspal or other calorie/activity tracker We'll get an ultrasound of your liver

## 2015-12-22 NOTE — Progress Notes (Signed)
BP 128/78 (BP Location: Left Arm, Patient Position: Sitting, Cuff Size: Large)   Pulse (!) 102   Temp 98.7 F (37.1 C)   Resp 20   Ht 5\' 2"  (1.575 m)   Wt 232 lb (105.2 kg)   SpO2 98%   BMI 42.43 kg/m    Subjective:    Patient ID: Wendy Kent, female    DOB: 07/27/1970, 45 y.o.   MRN: [id number]  HPI: Wendy Kent is a 45 y.o. female  Chief Complaint  Patient presents with  . Other    breast hypertrophy  . Obesity    nutritionist    Her heart rate has been "out of whack" for a while No palpitations though on potassium; few muscle cramps Taking HCTZ and making good urine output Doing better with water intake; drinks cranberry ginger ale, regular (not diet), two glasses  She saw the plastic surgeon on Monday; she is going to follow up with nutritionist; she will see him back November 15th; needs to weigh 200 pounds; started walking at the track at [facility]  She just saw someone at [facility] for her thyroid last year; due for labs again  She went to the hospital; pain up in the right flank; thought it might be kidney stone; saw stool; over-regular; no gallbladder; diagnosed with irritable bowel; hits her when she drinks milk  Hx of anemia, better after hysterectomy  Depression screen [facility] 2/9 12/22/2015 11/10/2015 06/15/2015 11/10/2014  Decreased Interest 0 0 0 0  Down, Depressed, Hopeless 0 0 1 0  PHQ - 2 Score 0 0 1 0   Relevant past medical, surgical, family and social history reviewed Past Medical History:  Diagnosis Date  . Absolute anemia 12/29/2012  . Allergy   . Blood transfusion without reported diagnosis   . BP (high blood pressure) 12/29/2012  . Breast hypertrophy 11/10/2014  . Thyroid disease    patient was cleared by specialist. History of Thyroid nodules.   Past Surgical History:  Procedure Laterality Date  . ABDOMINAL HYSTERECTOMY     patient still has ovaries  . CHOLECYSTECTOMY    . SHOULDER ARTHROSCOPY Right   . TUBAL LIGATION     No family history  on file. Social History  Substance Use Topics  . Smoking status: Never Smoker  . Smokeless tobacco: Not on file  . Alcohol use 0.0 oz/week     Comment: socially   Interim medical history since last visit reviewed. Allergies and medications reviewed  Review of Systems Per HPI unless specifically indicated above     Objective:    BP 128/78 (BP Location: Left Arm, Patient Position: Sitting, Cuff Size: Large)   Pulse (!) 102   Temp 98.7 F (37.1 C)   Resp 20   Ht 5\' 2"  (1.575 m)   Wt 232 lb (105.2 kg)   SpO2 98%   BMI 42.43 kg/m   Wt Readings from Last 3 Encounters:  12/22/15 232 lb (105.2 kg)  11/10/15 225 lb (102.1 kg)  09/26/15 220 lb 6 oz (100 kg)    Physical Exam  Constitutional: She appears well-developed and well-nourished. No distress.  Morbidly obese  HENT:  Head: Normocephalic and atraumatic.  Eyes: EOM are normal. No scleral icterus.  Neck: No thyromegaly present.  Cardiovascular: Normal rate, regular rhythm and normal heart sounds.   No murmur heard. Pulmonary/Chest: Effort normal and breath sounds normal. No respiratory distress. She has no wheezes.  Abdominal: Soft. Bowel sounds are normal. She exhibits no distension.  Musculoskeletal: Normal range of motion. She exhibits no edema.  Neurological: She is alert. She exhibits normal muscle tone.  Skin: Skin is warm and dry. She is not diaphoretic. No pallor.  Psychiatric: She has a normal mood and affect. Her behavior is normal. Judgment and thought content normal.      Assessment & Plan:   Problem List Items Addressed This Visit      Cardiovascular and Mediastinum   Hypertension goal BP (blood pressure) < 140/90    Try DASH guidelines      Cardiomegaly    Work on weight loss and control BP        Digestive   Lactose intolerance    Avoid all milk-containing products, read labels      Hepatomegaly    Check liver US; may be fatty liver      Relevant Orders   COMPLETE METABOLIC PANEL WITH  GFR (Completed)   US Abdomen Limited RUQ     Endocrine   Multinodular goiter    Check TSH, free T3, free T4      Relevant Orders   T3, free (Completed)   T4, free (Completed)   TSH (Completed)   Borderline diabetes    Patient believes that resolved; check A1c      Relevant Orders   Hemoglobin A1c (Completed)     Other   Obesity, Class III, BMI 40-49.9 (morbid obesity) ([place])    Discussed options; Saxenda and Belviq and Contrave discussed; she'll see nutritionist; try [email or web address]/, increase activity like walking, work up to 150 minutes a week; consider pool therapy      Iron deficiency anemia    Check CBC      Relevant Orders   CBC with Differential/Platelet (Completed)    Other Visit Diagnoses   None.     Follow up plan: Return in about 3 weeks (around 01/12/2016) for weight management.  An after-visit summary was printed and given to the patient at [facility].  Please see the patient instructions which may contain other information and recommendations beyond what is mentioned above in the assessment and plan.  No orders of the defined types were placed in this encounter.   Orders Placed This Encounter  Procedures  . US Abdomen Limited RUQ  . T3, free  . T4, free  . TSH  . COMPLETE METABOLIC PANEL WITH GFR  . CBC with Differential/Platelet  . Hemoglobin A1c

## 2015-12-22 NOTE — Assessment & Plan Note (Signed)
Work on weight loss and control BP

## 2015-12-22 NOTE — Assessment & Plan Note (Signed)
Check TSH, free T3, free T4

## 2015-12-22 NOTE — Assessment & Plan Note (Signed)
Patient believes that resolved; check A1c

## 2015-12-22 NOTE — Assessment & Plan Note (Signed)
Avoid all milk-containing products, read labels

## 2015-12-23 LAB — HEMOGLOBIN A1C
Hgb A1c MFr Bld: 5.3 % (ref <5.7)
Mean Plasma Glucose: 105 mg/dL

## 2015-12-28 ENCOUNTER — Telehealth: Payer: Self-pay | Admitting: Family Medicine

## 2015-12-28 NOTE — Telephone Encounter (Signed)
Pt.notified

## 2015-12-28 NOTE — Telephone Encounter (Signed)
No, I'm sorry but we won't call in antibiotics; she is welcome to go to urgent care

## 2016-01-04 ENCOUNTER — Ambulatory Visit
Admission: RE | Admit: 2016-01-04 | Discharge: 2016-01-04 | Disposition: A | Discharge status: Home / Self Care | Payer: Medicaid Other | Source: Ambulatory Visit | Attending: Family Medicine | Admitting: Family Medicine

## 2016-01-04 DIAGNOSIS — R160 Hepatomegaly, not elsewhere classified: Secondary | ICD-10-CM | POA: Diagnosis not present

## 2016-01-11 DIAGNOSIS — E876 Hypokalemia: Secondary | ICD-10-CM | POA: Insufficient documentation

## 2016-01-11 NOTE — Assessment & Plan Note (Signed)
Induced by diuretic; will start low-dose replacement three days per week, and encouraged her to get more potassium-rich foods as she changes her diet, fruits and veggies

## 2016-01-11 NOTE — Assessment & Plan Note (Signed)
Talked with patient about weight loss; I do not prescribe plain phentermine; encouraged decresed portions, increased movement, hydration; see AVS

## 2016-01-13 ENCOUNTER — Encounter: Payer: Self-pay | Admitting: Family Medicine

## 2016-01-13 ENCOUNTER — Ambulatory Visit (INDEPENDENT_AMBULATORY_CARE_PROVIDER_SITE_OTHER): Payer: Medicaid Other | Admitting: Family Medicine

## 2016-01-13 MED ORDER — LORCASERIN HCL 10 MG PO TABS: 1.0000 | ORAL_TABLET | Freq: Two times a day (BID) | ORAL | 0 refills | Status: DC

## 2016-01-13 NOTE — Progress Notes (Signed)
BP 116/70   Pulse 97   Temp 99.4 F (37.4 C) (Oral)   Resp 14   Wt 229 lb (103.9 kg)   SpO2 94%   BMI 41.88 kg/m    Subjective:    Patient ID: Wendy Kent, female    DOB: 30-Oct-1970, 45 y.o.   MRN: [id number]  HPI: Wendy Kent is a 45 y.o. female  Chief Complaint  Patient presents with  . Follow-up   Obesity; she has lost 3 pounds since last visit She asked about the Adipex; she says that Dr. Nadine Counts had sent her to thyroid specialist and that worked the best for her; she looked up the other weight loss medicine; she never had any problems with the Adipex; it actually stopped her heart palpitations she says; that was the week after her last visit; she is drinking plenty of water and is exercising regularly with a video She is exercising three times a week No soft drinks She drinks cranberry ginger ale every now and then, mostly water She asked about [place] strawberry lemonade Not counting calories; just eating baked foods, avoiding fried Did not  She had a little head cold and settled in her chest; over that now  Depression screen [facility] 2/9 12/22/2015 11/10/2015 06/15/2015 11/10/2014  Decreased Interest 0 0 0 0  Down, Depressed, Hopeless 0 0 1 0  PHQ - 2 Score 0 0 1 0   Relevant past medical, surgical, family and social history reviewed Past Medical History:  Diagnosis Date  . Absolute anemia 12/29/2012  . Allergy   . Blood transfusion without reported diagnosis   . BP (high blood pressure) 12/29/2012  . Breast hypertrophy 11/10/2014  . Thyroid disease    patient was cleared by specialist. History of Thyroid nodules.   No family history on file.   Social History  Substance Use Topics  . Smoking status: Never Smoker  . Smokeless tobacco: Not on file  . Alcohol use 0.0 oz/week     Comment: socially   Interim medical history since last visit reviewed. Allergies and medications reviewed  Review of Systems Per HPI unless specifically indicated above       Objective:    BP 116/70   Pulse 97   Temp 99.4 F (37.4 C) (Oral)   Resp 14   Wt 229 lb (103.9 kg)   SpO2 94%   BMI 41.88 kg/m   Wt Readings from Last 3 Encounters:  01/13/16 229 lb (103.9 kg)  12/22/15 232 lb (105.2 kg)  11/10/15 225 lb (102.1 kg)    Physical Exam  Constitutional: She appears well-developed and well-nourished. No distress.  Morbidly obese, but weight down 3 pounds in 3 weeks  Eyes: EOM are normal. No scleral icterus.  Neck: No thyromegaly present.  Cardiovascular: Normal rate.   Pulmonary/Chest: Effort normal.  Abdominal: She exhibits no distension.  Skin: No pallor.  Psychiatric: She has a normal mood and affect. Her behavior is normal. Judgment and thought content normal.      Assessment & Plan:   Problem List Items Addressed This Visit      Other   Obesity, Class III, BMI 40-49.9 (morbid obesity) ([place])    Refer to nutritionist; praise given for changes; start Belviq; return in 4-6 weeks      Relevant Medications   Lorcaserin HCl (BELVIQ) 10 MG TABS   Other Relevant Orders   Amb ref to Medical Nutrition Therapy-MNT    Other Visit Diagnoses   None.  Follow up plan: Return in about 6 weeks (around 02/24/2016) for weight management.  An after-visit summary was printed and given to the patient at [facility].  Please see the patient instructions which may contain other information and recommendations beyond what is mentioned above in the assessment and plan.  Meds ordered this encounter  Medications  . Lorcaserin HCl (BELVIQ) 10 MG TABS    Sig: Take 1 tablet by mouth 2 (two) times daily.    Dispense:  60 tablet    Refill:  0    Orders Placed This Encounter  Procedures  . Amb ref to Medical Nutrition Therapy-MNT   Face-to-face time with patient was more than 15 minutes, >50% time spent counseling and coordination of care

## 2016-01-13 NOTE — Assessment & Plan Note (Signed)
Refer to nutritionist; praise given for changes; start Belviq; return in 4-6 weeks

## 2016-01-13 NOTE — Patient Instructions (Signed)
Try sublingual vitamin B12 (SL) either 1000 or 2000 mcg daily

## 2016-02-09 ENCOUNTER — Encounter: Payer: Self-pay | Admitting: Dietician

## 2016-02-09 ENCOUNTER — Encounter: Payer: Medicaid Other | Attending: Family Medicine | Admitting: Dietician

## 2016-02-09 DIAGNOSIS — Z713 Dietary counseling and surveillance: Secondary | ICD-10-CM | POA: Diagnosis not present

## 2016-02-09 DIAGNOSIS — Z6841 Body Mass Index (BMI) 40.0 and over, adult: Secondary | ICD-10-CM | POA: Insufficient documentation

## 2016-02-09 NOTE — Patient Instructions (Signed)
Establish a consistent meal pattern of 3 meals and 1-2 snacks. If meals are spaced further than 6 hours apart, include a snack. Refer to list.  Balance meals with protein, 1-2 starch servings, '"free vegetables". Can add a fruit to any meal. Increase intake of "free vegetables".  Increase water intake to 5 (8oz) cups per day.  Exercise goal: 30 minutes, 5 days per week using phone app

## 2016-02-09 NOTE — Progress Notes (Signed)
Medical Nutrition Therapy: Visit start time: 9:15am  end time: 10:30 Assessment:  Diagnosis: obesity Past medical history: hypertension, hx of IBS Psychosocial issues/ stress concerns: Patient rates her stress as high and indicates "very well" as to how she is dealing with her stress. Preferred learning method:  . no preference indicated  Current weight: 230.7 lbs  Height: 62 in Medications, supplements: see list Progress and evaluation:  Patient in for initial  medical nutrition therapy  appointment. She reports she has had success with weight loss effort in the past by taking a medication (fen-phen) which helped decrease her appetite. In March of this year she reports a weigh of 200 lbs. After medication was discontinued coupled with excessive stress (job loss included), she began to eat more and has gained 30 lbs in past 6-7 months. She states she does not have a consistent pattern of eating,sometimes going long periods between meals and then over-eating. Her sons, ages 27 and 26 years are involved in sports, making a consistent dinner meal time difficult. She also reports she has been eating more snacks, especially sweets since she is at home more. She likes vegetables and fruits but her present diet is low in both as well as fiber and calcium sources. She has some degree of lactose intolerance but can usually tolerate cheese and yogurt.   Physical activity:Is using a phone app as a guide for exercise. She uses it 2 days per week for 30 minutes. (leg lifts, jog in place, sit-ups)  Dietary Intake:  Usual eating pattern includes 3 meals and 2- 3 snacks per day. Dining out frequency:3 meals per week.  Breakfast: 9:00- cereal, milk, water or orange juice or 2 pkg of flavored oatmeal or occasionally pancakes, bacon Snack: almonds Lunch: 2:00pm- [place] sandwich, 2 oz bag chips, water Snack: had been typically sweets but has been trying to limit and eat an apple for example. Supper: 7:30pm-  sandwich or burger or pizza several nights per week. Ex. Of meal at home: baked or fried chicken, mac 'n cheese, green beans or peas, 1-2 biscuits, cranberry g'ale or water or gaterade. Snack: almons Beverages: water- 4c daily, 2-3 cups of sweetened beverage daily  Nutrition Care Education: Basic nutrition: Reviewed  food group servings needed to meet basic nutrient needs. Encouraged to focus on foods needed verses focus on restriction. Weight control: Instructed on a meal plan based on 1600 calories including carbohydrate counting and how to better balance meals with protein, starch, fruit and non-starchy vegetables. Discussed mindful eating and developing strategies to move toward healthier diet habits verses relying on will power.  Nutritional Diagnosis:  [place]-3.3 Overweight/obesity As related to history of inconsistent meal pattern leading to large portions at evening meal as well as excessive snacking.  As evidenced by diet history..  Intervention:  Establish a consistent meal pattern of 3 meals and 1-2 snacks. If meals are spaced further than 6 hours apart, include a snack. Refer to list.  Balance meals with protein, 1-2 starch servings, '"free vegetables". Can add a fruit to any meal. Increase intake of "free vegetables".  Increase water intake to 5 (8oz) cups per day.  Exercise goal: 30 minutes, 5 days per week using phone app   Education Materials given:  . Food lists/ Planning A Balanced Meal . Sample meal pattern/ menus . Snacking handout . Goals/ instructions Learner/ who was taught:  . Patient  Level of understanding: . Partial understanding; needs review/ practice Learning barriers: . None Willingness to learn/ readiness for  change: . Acceptance, ready for change Monitoring and Evaluation:  Dietary intake, exercise, , and body weight      follow up: 03/08/16

## 2016-02-14 ENCOUNTER — Other Ambulatory Visit: Payer: Self-pay | Admitting: Family Medicine

## 2016-02-14 DIAGNOSIS — I10 Essential (primary) hypertension: Secondary | ICD-10-CM

## 2016-02-14 MED ORDER — POTASSIUM CHLORIDE ER 10 MEQ PO TBCR: EXTENDED_RELEASE_TABLET | ORAL | 1 refills | Status: DC

## 2016-02-14 MED ORDER — HYDROCHLOROTHIAZIDE 25 MG PO TABS: 25.0000 mg | ORAL_TABLET | Freq: Every day | ORAL | 1 refills | Status: DC

## 2016-02-14 NOTE — Telephone Encounter (Signed)
Aug 2017 labs reviewed; Rx approved, 90 day supply + 1 RF of each sent

## 2016-02-14 NOTE — Telephone Encounter (Signed)
Requesting refill on hydrochlorothiazide and potassium. Please send to [facility]

## 2016-02-24 ENCOUNTER — Ambulatory Visit: Payer: Medicaid Other | Admitting: Family Medicine

## 2016-03-08 ENCOUNTER — Ambulatory Visit: Payer: Medicaid Other | Admitting: Dietician

## 2016-03-20 ENCOUNTER — Encounter: Payer: Self-pay | Admitting: Dietician

## 2016-06-07 ENCOUNTER — Ambulatory Visit: Payer: Medicaid Other | Admitting: Family Medicine

## 2016-06-08 ENCOUNTER — Ambulatory Visit: Payer: Medicaid Other | Admitting: Family Medicine

## 2016-08-22 ENCOUNTER — Other Ambulatory Visit: Payer: Self-pay | Admitting: Family Medicine

## 2016-08-22 DIAGNOSIS — I10 Essential (primary) hypertension: Secondary | ICD-10-CM

## 2017-02-22 ENCOUNTER — Other Ambulatory Visit: Payer: Self-pay

## 2017-02-22 DIAGNOSIS — J302 Other seasonal allergic rhinitis: Secondary | ICD-10-CM

## 2017-02-22 MED ORDER — FLUTICASONE PROPIONATE 50 MCG/ACT NA SUSP: 2.0000 | Freq: Every day | NASAL | 0 refills | Status: DC

## 2017-02-22 NOTE — Telephone Encounter (Signed)
Last seen Sept 2017 Needs appt please I sent one Rx refill for her nasal spray Thank you

## 2017-02-22 NOTE — Telephone Encounter (Signed)
Called pt no answer. LM for pt informing her that one refill was sent in however for any further refills she would need appt. Advised pt that she needs to be seen overall as she has not been seen in a year. CRM created.

## 2017-03-06 ENCOUNTER — Encounter: Payer: Self-pay | Admitting: Family Medicine

## 2017-03-06 ENCOUNTER — Ambulatory Visit: Payer: BLUE CROSS/BLUE SHIELD | Admitting: Family Medicine

## 2017-03-06 VITALS — BP 122/74 | HR 94 | Temp 97.7°F | Resp 14 | Wt 229.2 lb

## 2017-03-06 DIAGNOSIS — N62 Hypertrophy of breast: Secondary | ICD-10-CM

## 2017-03-06 DIAGNOSIS — I10 Essential (primary) hypertension: Secondary | ICD-10-CM | POA: Diagnosis not present

## 2017-03-06 DIAGNOSIS — E0590 Thyrotoxicosis, unspecified without thyrotoxic crisis or storm: Secondary | ICD-10-CM

## 2017-03-06 DIAGNOSIS — Z23 Encounter for immunization: Secondary | ICD-10-CM

## 2017-03-06 DIAGNOSIS — Z114 Encounter for screening for human immunodeficiency virus [HIV]: Secondary | ICD-10-CM

## 2017-03-06 DIAGNOSIS — R7303 Prediabetes: Secondary | ICD-10-CM

## 2017-03-06 DIAGNOSIS — D509 Iron deficiency anemia, unspecified: Secondary | ICD-10-CM

## 2017-03-06 DIAGNOSIS — Z1231 Encounter for screening mammogram for malignant neoplasm of breast: Secondary | ICD-10-CM

## 2017-03-06 DIAGNOSIS — Z1239 Encounter for other screening for malignant neoplasm of breast: Secondary | ICD-10-CM

## 2017-03-06 DIAGNOSIS — E66813 Obesity, class 3: Secondary | ICD-10-CM

## 2017-03-06 MED ORDER — HYDROCHLOROTHIAZIDE 25 MG PO TABS: 25.0000 mg | ORAL_TABLET | Freq: Every day | ORAL | 3 refills | Status: DC

## 2017-03-06 MED ORDER — LORCASERIN HCL 10 MG PO TABS: 1.0000 | ORAL_TABLET | Freq: Two times a day (BID) | ORAL | 0 refills | Status: DC

## 2017-03-06 NOTE — Progress Notes (Signed)
BP 122/74   Pulse 94   Temp 97.7 F (36.5 C) (Oral)   Resp 14   Wt 229 lb 3.2 oz (104 kg)   SpO2 96%   BMI 41.92 kg/m    Subjective:    Patient ID: Wendy Kent, female    DOB: 25-Mar-1971, 46 y.o.   MRN: [id number]  HPI: Wendy Kent is a 46 y.o. female  Chief Complaint  Patient presents with  . Medication Refill    HPI Patient is here for f/u She has doing well with her medicine; tyrying to do better with her eating; doing probiotics Gallbladder taken out, so helps with gas More intake of water Had a wellness check with her job; BP was excellent; cholesterol was "perfect", she says it was 126; runs in the family  Seeing Dr. Towanda Malkin and hoping to proceed with reduction mammoplasty; weight loss is required for breast reduction; she managed to lose 12 pounds, but hard being a single parent; she was going to weight loss place on [facility]  Hx of low iron, but then had hysterectomy; resolved  Not taking potassium any more  Some diabetes on father's side; mother did not have it; not sure about father  She had goiter and hyperthyroidism; saw specialist; nothing wrong  Depression screen [facility] 2/9 03/06/2017 02/09/2016 12/22/2015 11/10/2015 06/15/2015  Decreased Interest 0 0 0 0 0  Down, Depressed, Hopeless 0 0 0 0 1  PHQ - 2 Score 0 0 0 0 1    Relevant past medical, surgical, family and social history reviewed Past Medical History:  Diagnosis Date  . Absolute anemia 12/29/2012  . Allergy   . Blood transfusion without reported diagnosis   . BP (high blood pressure) 12/29/2012  . Breast hypertrophy 11/10/2014  . Thyroid disease    patient was cleared by specialist. History of Thyroid nodules.   Past Surgical History:  Procedure Laterality Date  . ABDOMINAL HYSTERECTOMY     patient still has ovaries  . CHOLECYSTECTOMY    . SHOULDER ARTHROSCOPY Right   . TUBAL LIGATION     No family history on file. Social History   Socioeconomic History  . Marital status:  Divorced    Spouse name: Not on file  . Number of children: 4  . Years of education: Not on file  . Highest education level: Not on file  Social Needs  . Financial resource strain: Not on file  . Food insecurity - worry: Not on file  . Food insecurity - inability: Not on file  . Transportation needs - medical: Not on file  . Transportation needs - non-medical: Not on file  Occupational History  . Occupation: TEFL teacher    Comment: [employer]  Tobacco Use  . Smoking status: Never Smoker  . Smokeless tobacco: Never Used  Substance and Sexual Activity  . Alcohol use: Yes    Alcohol/week: 0.0 oz    Comment: socially  . Drug use: No  . Sexual activity: Yes  Other Topics Concern  . Not on file  Social History Narrative  . Not on file    Interim medical history since last visit reviewed. Allergies and medications reviewed  Review of Systems Per HPI unless specifically indicated above     Objective:    BP 122/74   Pulse 94   Temp 97.7 F (36.5 C) (Oral)   Resp 14   Wt 229 lb 3.2 oz (104 kg)   SpO2 96%   BMI 41.92 kg/m  Wt Readings from Last 3 Encounters:  03/06/17 229 lb 3.2 oz (104 kg)  02/09/16 230 lb 11.2 oz (104.6 kg)  01/13/16 229 lb (103.9 kg)    Physical Exam  Constitutional: She appears well-developed and well-nourished. No distress.  Morbidly obese  Eyes: EOM are normal. No scleral icterus.  Neck: No thyromegaly present.  Cardiovascular: Normal rate.  Pulmonary/Chest: Effort normal. No respiratory distress.  Abdominal: She exhibits no distension.  Neurological: She is alert.  Skin: No pallor.  Psychiatric: She has a normal mood and affect. Her behavior is normal. Judgment and thought content normal.    Results for orders placed or performed in visit on 12/22/15  T3, free  Result Value Ref Range   T3, Free 3.0 2.3 - 4.2 pg/mL  T4, free  Result Value Ref Range   Free T4 1.0 0.8 - 1.8 ng/dL  TSH  Result Value Ref Range   TSH 0.65 mIU/L    COMPLETE METABOLIC PANEL WITH GFR  Result Value Ref Range   Sodium 141 135 - 146 mmol/L   Potassium 4.1 3.5 - 5.3 mmol/L   Chloride 103 98 - 110 mmol/L   CO2 28 20 - 31 mmol/L   Glucose, Bld 94 65 - 99 mg/dL   BUN 14 7 - 25 mg/dL   Creat 0.74 0.50 - 1.10 mg/dL   Total Bilirubin 0.3 0.2 - 1.2 mg/dL   Alkaline Phosphatase 59 33 - 115 U/L   AST 16 10 - 35 U/L   ALT 14 6 - 29 U/L   Total Protein 6.8 6.1 - 8.1 g/dL   Albumin 3.7 3.6 - 5.1 g/dL   Calcium 9.3 8.6 - 10.2 mg/dL   GFR, Est African American >89 >=60 mL/min   GFR, Est Non African American >89 >=60 mL/min  CBC with Differential/Platelet  Result Value Ref Range   WBC 9.1 3.8 - 10.8 K/uL   RBC 4.39 3.80 - 5.10 MIL/uL   Hemoglobin 12.4 11.7 - 15.5 g/dL   HCT 37.8 35.0 - 45.0 %   MCV 86.1 80.0 - 100.0 fL   MCH 28.2 27.0 - 33.0 pg   MCHC 32.8 32.0 - 36.0 g/dL   RDW 15.7 (H) 11.0 - 15.0 %   Platelets 349 140 - 400 K/uL   MPV 9.9 7.5 - 12.5 fL   Neutro Abs 6,552 1,500 - 7,800 cells/uL   Lymphs Abs 2,093 850 - 3,900 cells/uL   Monocytes Absolute 364 200 - 950 cells/uL   Eosinophils Absolute 91 15 - 500 cells/uL   Basophils Absolute 0 0 - 200 cells/uL   Neutrophils Relative % 72 %   Lymphocytes Relative 23 %   Monocytes Relative 4 %   Eosinophils Relative 1 %   Basophils Relative 0 %   Smear Review Criteria for review not met   Hemoglobin A1c  Result Value Ref Range   Hgb A1c MFr Bld 5.3 <5.7 %   Mean Plasma Glucose 105 mg/dL      Assessment & Plan:   Problem List Items Addressed This Visit      Cardiovascular and Mediastinum   Hypertension goal BP (blood pressure) < 140/90    Continue the HCTZ; check K+ to see if supplemental K+ is needed; working on weight loss; no added salt, do try to watch amount of sodium already in prepared food; try DASH guidelines      Relevant Medications   hydrochlorothiazide (HYDRODIURIL) 25 MG tablet     Endocrine   Hyperthyroidism,  subclinical    Check TSH      Relevant  Orders   TSH     Other   Obesity, Class III, BMI 40-49.9 (morbid obesity) ([place])    Start Belviq; will seek prior auth if needed; praise given for activity and water      Relevant Medications   Lorcaserin HCl (BELVIQ) 10 MG TABS   Iron deficiency anemia    Check CBC today      Relevant Orders   CBC with Differential/Platelet   Breast hypertrophy    Working with plastic surgeon      Borderline diabetes    Check glucose and A1c (fasting)      Relevant Orders   COMPLETE METABOLIC PANEL WITH GFR   Hemoglobin A1c   Lipid panel    Other Visit Diagnoses    Screening for breast cancer    -  Primary   Relevant Orders   MM Digital Screening   Need for diphtheria-tetanus-pertussis (Tdap) vaccine       Screening for HIV (human immunodeficiency virus)       Relevant Orders   HIV antibody (with reflex)       Follow up plan: Return in about 6 weeks (around 04/17/2017) for follow-up visit with Dr. Sanda Klein.  An after-visit summary was printed and given to the patient at [place].  Please see the patient instructions which may contain other information and recommendations beyond what is mentioned above in the assessment and plan.  Meds ordered this encounter  Medications  . Lorcaserin HCl (BELVIQ) 10 MG TABS    Sig: Take 1 tablet 2 (two) times daily by mouth.    Dispense:  60 tablet    Refill:  0  . hydrochlorothiazide (HYDRODIURIL) 25 MG tablet    Sig: Take 1 tablet (25 mg total) daily by mouth.    Dispense:  90 tablet    Refill:  3    Orders Placed This Encounter  Procedures  . MM Digital Screening  . HIV antibody (with reflex)  . CBC with Differential/Platelet  . COMPLETE METABOLIC PANEL WITH GFR  . Hemoglobin A1c  . Lipid panel  . TSH

## 2017-03-06 NOTE — Assessment & Plan Note (Signed)
Check glucose and A1c (fasting)

## 2017-03-06 NOTE — Patient Instructions (Addendum)
We'll check labs today Start the [place] Work up to 150 minutes of activity every week Check out the information at [employer] entitled "Nutrition for Weight Loss: What You Need to Know about Fad Diets" Try to lose between 1-2 pounds per week by taking in fewer calories and burning off more calories You can succeed by limiting portions, limiting foods dense in calories and fat, becoming more active, and drinking 8 glasses of water a day (64 ounces) Don't skip meals, especially breakfast, as skipping meals may alter your metabolism Do not use over-the-counter weight loss pills or gimmicks that claim rapid weight loss A healthy BMI (or body mass index) is between 18.5 and 24.9 You can calculate your ideal BMI at the [place] website ClubMonetize.fr    DASH Eating Plan DASH stands for "Dietary Approaches to Stop Hypertension." The DASH eating plan is a healthy eating plan that has been shown to reduce high blood pressure (hypertension). It may also reduce your risk for type 2 diabetes, heart disease, and stroke. The DASH eating plan may also help with weight loss. What are tips for following this plan? General guidelines  Avoid eating more than 2,300 mg (milligrams) of salt (sodium) a day. If you have hypertension, you may need to reduce your sodium intake to 1,500 mg a day.  Limit alcohol intake to no more than 1 drink a day for nonpregnant women and 2 drinks a day for men. One drink equals 12 oz of beer, 5 oz of wine, or 1 oz of hard liquor.  Work with your health care provider to maintain a healthy body weight or to lose weight. Ask what an ideal weight is for you.  Get at least 30 minutes of exercise that causes your heart to beat faster (aerobic exercise) most days of the week. Activities may include walking, swimming, or biking.  Work with your health care provider or diet and nutrition specialist (dietitian) to adjust your eating plan  to your individual calorie needs. Reading food labels  Check food labels for the amount of sodium per serving. Choose foods with less than 5 percent of the Daily Value of sodium. Generally, foods with less than 300 mg of sodium per serving fit into this eating plan.  To find whole grains, look for the word "whole" as the first word in the ingredient list. Shopping  Buy products labeled as "low-sodium" or "no salt added."  Buy fresh foods. Avoid canned foods and premade or frozen meals. Cooking  Avoid adding salt when cooking. Use salt-free seasonings or herbs instead of table salt or sea salt. Check with your health care provider or pharmacist before using salt substitutes.  Do not fry foods. Cook foods using healthy methods such as baking, boiling, grilling, and broiling instead.  Cook with heart-healthy oils, such as olive, canola, soybean, or sunflower oil. Meal planning   Eat a balanced diet that includes: ? 5 or more servings of fruits and vegetables each day. At each meal, try to fill half of your plate with fruits and vegetables. ? Up to 6-8 servings of whole grains each day. ? Less than 6 oz of lean meat, poultry, or fish each day. A 3-oz serving of meat is about the same size as a deck of cards. One egg equals 1 oz. ? 2 servings of low-fat dairy each day. ? A serving of nuts, seeds, or beans 5 times each week. ? Heart-healthy fats. Healthy fats called Omega-3 fatty acids are found in foods such as  flaxseeds and coldwater fish, like sardines, salmon, and mackerel.  Limit how much you eat of the following: ? Canned or prepackaged foods. ? Food that is high in trans fat, such as fried foods. ? Food that is high in saturated fat, such as fatty meat. ? Sweets, desserts, sugary drinks, and other foods with added sugar. ? Full-fat dairy products.  Do not salt foods before eating.  Try to eat at least 2 vegetarian meals each week.  Eat more home-cooked food and less  restaurant, buffet, and fast food.  When eating at a restaurant, ask that your food be prepared with less salt or no salt, if possible. What foods are recommended? The items listed may not be a complete list. Talk with your dietitian about what dietary choices are best for you. Grains Whole-grain or whole-wheat bread. Whole-grain or whole-wheat pasta. Brown rice. [place]. Bulgur. Whole-grain and low-sodium cereals. Pita bread. Low-fat, low-sodium crackers. Whole-wheat flour tortillas. Vegetables Fresh or frozen vegetables (raw, steamed, roasted, or grilled). Low-sodium or reduced-sodium tomato and vegetable juice. Low-sodium or reduced-sodium tomato sauce and tomato paste. Low-sodium or reduced-sodium canned vegetables. Fruits All fresh, dried, or frozen fruit. Canned fruit in natural juice (without added sugar). Meat and other protein foods Skinless chicken or [place]. Ground chicken or [place]. Pork with fat trimmed off. Fish and seafood. Egg whites. Dried beans, peas, or lentils. Unsalted nuts, nut butters, and seeds. Unsalted canned beans. Lean cuts of beef with fat trimmed off. Low-sodium, lean deli meat. Dairy Low-fat (1%) or fat-free (skim) milk. Fat-free, low-fat, or reduced-fat cheeses. Nonfat, low-sodium ricotta or cottage cheese. Low-fat or nonfat yogurt. Low-fat, low-sodium cheese. Fats and oils Soft margarine without trans fats. Vegetable oil. Low-fat, reduced-fat, or light mayonnaise and salad dressings (reduced-sodium). Canola, safflower, olive, soybean, and sunflower oils. Avocado. Seasoning and other foods Herbs. Spices. Seasoning mixes without salt. Unsalted popcorn and pretzels. Fat-free sweets. What foods are not recommended? The items listed may not be a complete list. Talk with your dietitian about what dietary choices are best for you. Grains Baked goods made with fat, such as croissants, muffins, or some breads. Dry pasta or rice meal packs. Vegetables Creamed or  fried vegetables. Vegetables in a cheese sauce. Regular canned vegetables (not low-sodium or reduced-sodium). Regular canned tomato sauce and paste (not low-sodium or reduced-sodium). Regular tomato and vegetable juice (not low-sodium or reduced-sodium). Angie Fava. Olives. Fruits Canned fruit in a light or heavy syrup. Fried fruit. Fruit in cream or butter sauce. Meat and other protein foods Fatty cuts of meat. Ribs. Fried meat. Berniece Salines. Sausage. Bologna and other processed lunch meats. Salami. Fatback. Hotdogs. Bratwurst. Salted nuts and seeds. Canned beans with added salt. Canned or smoked fish. Whole eggs or egg yolks. Chicken or [place] with skin. Dairy Whole or 2% milk, cream, and half-and-half. Whole or full-fat cream cheese. Whole-fat or sweetened yogurt. Full-fat cheese. Nondairy creamers. Whipped toppings. Processed cheese and cheese spreads. Fats and oils Butter. Stick margarine. Lard. Shortening. Ghee. Bacon fat. Tropical oils, such as coconut, palm kernel, or palm oil. Seasoning and other foods Salted popcorn and pretzels. Onion salt, garlic salt, seasoned salt, table salt, and sea salt. Worcestershire sauce. Tartar sauce. Barbecue sauce. Teriyaki sauce. Soy sauce, including reduced-sodium. Steak sauce. Canned and packaged gravies. Fish sauce. Oyster sauce. Cocktail sauce. Horseradish that you find on the shelf. Ketchup. Mustard. Meat flavorings and tenderizers. Bouillon cubes. Hot sauce and Tabasco sauce. Premade or packaged marinades. Premade or packaged taco seasonings. Relishes. Regular salad dressings. Where  to find more information:  National Heart, Lung, and [place]: https://wilson-eaton.com/  American Heart Association: www.heart.org Summary  The DASH eating plan is a healthy eating plan that has been shown to reduce high blood pressure (hypertension). It may also reduce your risk for type 2 diabetes, heart disease, and stroke.  With the DASH eating plan, you should limit salt  (sodium) intake to 2,300 mg a day. If you have hypertension, you may need to reduce your sodium intake to 1,500 mg a day.  When on the DASH eating plan, aim to eat more fresh fruits and vegetables, whole grains, lean proteins, low-fat dairy, and heart-healthy fats.  Work with your health care provider or diet and nutrition specialist (dietitian) to adjust your eating plan to your individual calorie needs. This information is not intended to replace advice given to you by your health care provider. Make sure you discuss any questions you have with your health care provider. Document Released: 03/29/2011 Document Revised: 04/02/2016 Document Reviewed: 04/02/2016 Elsevier Interactive Patient Education  2017 [employer].

## 2017-03-06 NOTE — Assessment & Plan Note (Signed)
Continue the HCTZ; check K+ to see if supplemental K+ is needed; working on weight loss; no added salt, do try to watch amount of sodium already in prepared food; try DASH guidelines

## 2017-03-06 NOTE — Assessment & Plan Note (Signed)
Start Belviq; will seek prior auth if needed; praise given for activity and water

## 2017-03-06 NOTE — Assessment & Plan Note (Signed)
Check TSH 

## 2017-03-06 NOTE — Assessment & Plan Note (Signed)
Working with Psychiatric nurse

## 2017-03-06 NOTE — Assessment & Plan Note (Signed)
Check CBC today.  

## 2017-03-07 ENCOUNTER — Other Ambulatory Visit: Payer: Self-pay | Admitting: Family Medicine

## 2017-03-07 DIAGNOSIS — D509 Iron deficiency anemia, unspecified: Secondary | ICD-10-CM

## 2017-03-07 LAB — COMPLETE METABOLIC PANEL WITH GFR
AG Ratio: 1.3 (calc) (ref 1.0–2.5)
ALT: 9 U/L (ref 6–29)
AST: 10 U/L (ref 10–35)
Albumin: 3.9 g/dL (ref 3.6–5.1)
Alkaline phosphatase (APISO): 66 U/L (ref 33–115)
BUN: 14 mg/dL (ref 7–25)
CO2: 27 mmol/L (ref 20–32)
Calcium: 9.1 mg/dL (ref 8.6–10.2)
Chloride: 105 mmol/L (ref 98–110)
Creat: 0.6 mg/dL (ref 0.50–1.10)
GFR, Est African American: 127 mL/min/{1.73_m2} (ref >=60)
GFR, Est Non African American: 109 mL/min/{1.73_m2} (ref >=60)
Globulin: 3.1 g/dL (calc) (ref 1.9–3.7)
Glucose, Bld: 77 mg/dL (ref 65–99)
Potassium: 4 mmol/L (ref 3.5–5.3)
Sodium: 141 mmol/L (ref 135–146)
Total Bilirubin: 0.4 mg/dL (ref 0.2–1.2)
Total Protein: 7 g/dL (ref 6.1–8.1)

## 2017-03-07 LAB — CBC WITH DIFFERENTIAL/PLATELET
Basophils Absolute: 38 cells/uL (ref 0–200)
Basophils Relative: 0.5 %
Eosinophils Absolute: 90 cells/uL (ref 15–500)
Eosinophils Relative: 1.2 %
HCT: 34.7 % — ABNORMAL LOW (ref 35.0–45.0)
Hemoglobin: 11.6 g/dL — ABNORMAL LOW (ref 11.7–15.5)
Lymphs Abs: 1710 cells/uL (ref 850–3900)
MCH: 28 pg (ref 27.0–33.0)
MCHC: 33.4 g/dL (ref 32.0–36.0)
MCV: 83.8 fL (ref 80.0–100.0)
MPV: 10.3 fL (ref 7.5–12.5)
Monocytes Relative: 4.5 %
Neutro Abs: 5325 cells/uL (ref 1500–7800)
Neutrophils Relative %: 71 %
Platelets: 361 10*3/uL (ref 140–400)
RBC: 4.14 10*6/uL (ref 3.80–5.10)
RDW: 14.7 % (ref 11.0–15.0)
Total Lymphocyte: 22.8 %
WBC mixed population: 338 cells/uL (ref 200–950)
WBC: 7.5 10*3/uL (ref 3.8–10.8)

## 2017-03-07 LAB — LIPID PANEL
Cholesterol: 130 mg/dL (ref <200)
HDL: 62 mg/dL (ref >50)
LDL Cholesterol (Calc): 54 mg/dL (calc)
Non-HDL Cholesterol (Calc): 68 mg/dL (calc) (ref <130)
Total CHOL/HDL Ratio: 2.1 (calc) (ref <5.0)
Triglycerides: 65 mg/dL (ref <150)

## 2017-03-07 LAB — HEMOGLOBIN A1C
Hgb A1c MFr Bld: 5.4 % of total Hgb (ref <5.7)
Mean Plasma Glucose: 108 (calc)
eAG (mmol/L): 6 (calc)

## 2017-03-07 LAB — HIV ANTIBODY (ROUTINE TESTING W REFLEX): HIV 1&2 Ab, 4th Generation: NONREACTIVE

## 2017-03-07 LAB — TSH: TSH: 0.41 mIU/L

## 2017-03-07 NOTE — Assessment & Plan Note (Signed)
Recheck cbc in 2 months

## 2017-03-07 NOTE — Progress Notes (Signed)
Check CBC in 2 months

## 2017-03-13 DIAGNOSIS — M546 Pain in thoracic spine: Secondary | ICD-10-CM | POA: Diagnosis not present

## 2017-03-13 DIAGNOSIS — N62 Hypertrophy of breast: Secondary | ICD-10-CM | POA: Diagnosis not present

## 2017-03-13 DIAGNOSIS — M542 Cervicalgia: Secondary | ICD-10-CM | POA: Diagnosis not present

## 2017-03-18 ENCOUNTER — Telehealth: Payer: Self-pay

## 2017-03-18 ENCOUNTER — Telehealth (HOSPITAL_COMMUNITY): Payer: Self-pay

## 2017-03-18 DIAGNOSIS — N62 Hypertrophy of breast: Secondary | ICD-10-CM

## 2017-03-18 NOTE — Telephone Encounter (Signed)
Copied from [place]. Topic: Referral - Request >> Mar 15, 2017 12:36 PM [place], Wendy Kent, [place] wrote: Reason for NOT:[id number] called because she needs a referral to get a breast reduction done and weight loss pills. If someone could give her a call back about this matter   Are you willing to do this since you just saw her on 11/14 and discussed?

## 2017-03-18 NOTE — Assessment & Plan Note (Signed)
Refer to plastic surgeon  

## 2017-03-18 NOTE — Telephone Encounter (Signed)
I entered the referral to plastic surgery, so she should hear about that in the next week; have her contact Kristeen Miss if she has not heard anything in one week I prescribed a weight loss pill at our last visit; see if you can help her with her question or issue Thank you

## 2017-03-18 NOTE — Telephone Encounter (Signed)
Left detailed voicemail

## 2017-03-19 NOTE — Telephone Encounter (Signed)
I contacted the [facility] and got the correct fax number [phone]) and scheduled the patient for a consult.  I then tried to contact this patient to inform her that she has been scheduled to see Dr. Crissie Reese on Wednesday, March 27, 2017 @ 10am, but there was no answer. This information was left on her voicemail along with their number [phone]) in case she needed to reschedule.

## 2017-03-19 NOTE — Telephone Encounter (Signed)
Copied from [place]. Topic: Referral - Status >> Mar 19, 2017  9:45 AM Robina Ade, Helene Kelp D wrote: Reason for CRM: [facility] called and said a referral was sent to their office for patient to see  Dr. Crissie Reese. They said he is not there and that needs to be corrected. Their number is [phone]

## 2017-03-20 ENCOUNTER — Telehealth: Payer: Self-pay

## 2017-03-20 NOTE — Telephone Encounter (Signed)
Copied from [place]. Topic: Referral - Status >> Mar 19, 2017  9:45 AM Robina Ade, Helene Kelp D wrote: Reason for CRM: [facility] called and said a referral was sent to their office for patient to see  Dr. Crissie Reese. They said he is not there and that needs to be corrected. Their number is [phone]  >> Mar 19, 2017  2:31 PM [place], [place], [place] wrote: I contacted the [facility] and got the correct fax number [phone]) and scheduled the patient for a consult.  I then tried to contact this patient to inform her that she has been scheduled to see Dr. Crissie Reese on Wednesday, March 27, 2017 @ 10am, but there was no answer. This information was left on her voicemail along with their number [phone]) in case she needed to reschedule.  >> Mar 20, 2017  9:23 AM Burnis Medin, NT wrote: Pt. Called back and said she received the message about the referral with. Dr. Harlow Mares but she does not want to go there. Pt said she has already started the process with Dr. Lennette Bihari. Pt would like a call back.   Per the request of the patient: Faxed notes to Dr. Towanda Malkin [phone]

## 2017-04-19 ENCOUNTER — Ambulatory Visit: Payer: BLUE CROSS/BLUE SHIELD | Admitting: Family Medicine

## 2017-04-26 ENCOUNTER — Ambulatory Visit: Payer: BLUE CROSS/BLUE SHIELD | Admitting: Family Medicine

## 2017-04-30 ENCOUNTER — Encounter: Payer: Self-pay | Admitting: Family Medicine

## 2017-04-30 ENCOUNTER — Ambulatory Visit: Payer: BLUE CROSS/BLUE SHIELD | Admitting: Family Medicine

## 2017-04-30 VITALS — BP 138/80 | HR 100 | Temp 98.2°F | Ht 62.0 in | Wt 227.1 lb

## 2017-04-30 DIAGNOSIS — D509 Iron deficiency anemia, unspecified: Secondary | ICD-10-CM | POA: Diagnosis not present

## 2017-04-30 DIAGNOSIS — N62 Hypertrophy of breast: Secondary | ICD-10-CM

## 2017-04-30 DIAGNOSIS — Z1211 Encounter for screening for malignant neoplasm of colon: Secondary | ICD-10-CM | POA: Diagnosis not present

## 2017-04-30 LAB — CBC
HCT: 36.9 % (ref 35.0–45.0)
Hemoglobin: 12.2 g/dL (ref 11.7–15.5)
MCH: 27.9 pg (ref 27.0–33.0)
MCHC: 33.1 g/dL (ref 32.0–36.0)
MCV: 84.4 fL (ref 80.0–100.0)
MPV: 10.4 fL (ref 7.5–12.5)
Platelets: 371 10*3/uL (ref 140–400)
RBC: 4.37 10*6/uL (ref 3.80–5.10)
RDW: 14.4 % (ref 11.0–15.0)
WBC: 8.9 10*3/uL (ref 3.8–10.8)

## 2017-04-30 MED ORDER — LIRAGLUTIDE -WEIGHT MANAGEMENT 18 MG/3ML ~~LOC~~ SOPN: 0.6000 mg | PEN_INJECTOR | Freq: Every day | SUBCUTANEOUS | 0 refills | Status: DC

## 2017-04-30 NOTE — Assessment & Plan Note (Signed)
planning for surgery soon

## 2017-04-30 NOTE — Assessment & Plan Note (Signed)
Check CBC and iron panel today.  

## 2017-04-30 NOTE — Patient Instructions (Signed)
Stop the [facility] the [place] Check out the information at familydoctor.org entitled "Nutrition for Weight Loss: What You Need to Know about Fad Diets" Try to lose between 1-2 pounds per week by taking in fewer calories and burning off more calories You can succeed by limiting portions, limiting foods dense in calories and fat, becoming more active, and drinking 8 glasses of water a day (64 ounces) Don't skip meals, especially breakfast, as skipping meals may alter your metabolism Do not use over-the-counter weight loss pills or gimmicks that claim rapid weight loss A healthy BMI (or body mass index) is between 18.5 and 24.9 You can calculate your ideal BMI at the [place] website ClubMon[employer].fr

## 2017-04-30 NOTE — Assessment & Plan Note (Signed)
Working on this; Belviq not impressive and will therefore switch to [employer]
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
## 2017-05-06 ENCOUNTER — Other Ambulatory Visit: Payer: Self-pay

## 2017-05-06 ENCOUNTER — Telehealth: Payer: Self-pay

## 2017-05-06 MED ORDER — INSULIN PEN NEEDLE 31G X 5 MM MISC: 5 refills | Status: DC

## 2017-05-06 MED ORDER — PEN NEEDLES 31G X 6 MM MISC: 0 refills | Status: DC

## 2017-05-06 NOTE — Telephone Encounter (Signed)
Rx sent 

## 2017-05-06 NOTE — Telephone Encounter (Signed)
Incoming request from pt's pharmacy to switch needle brands, insurance will cover BD UF PEN NEEDLES ([id number]) please change

## 2017-05-06 NOTE — Telephone Encounter (Signed)
Needs rx for pen needles for saxenda

## 2017-05-24 ENCOUNTER — Encounter: Payer: Self-pay | Admitting: *Deleted

## 2017-06-04 ENCOUNTER — Other Ambulatory Visit: Payer: Self-pay | Admitting: Family Medicine

## 2017-06-05 NOTE — Telephone Encounter (Signed)
appt later this month; finished taper up Okay for refill

## 2017-06-14 ENCOUNTER — Encounter: Payer: Self-pay | Admitting: Family Medicine

## 2017-06-14 ENCOUNTER — Ambulatory Visit: Payer: BLUE CROSS/BLUE SHIELD | Admitting: Family Medicine

## 2017-06-14 DIAGNOSIS — I10 Essential (primary) hypertension: Secondary | ICD-10-CM | POA: Diagnosis not present

## 2017-06-14 DIAGNOSIS — N62 Hypertrophy of breast: Secondary | ICD-10-CM | POA: Diagnosis not present

## 2017-06-14 MED ORDER — LIRAGLUTIDE -WEIGHT MANAGEMENT 18 MG/3ML ~~LOC~~ SOPN: 3.0000 mg | PEN_INJECTOR | Freq: Every day | SUBCUTANEOUS | 0 refills | Status: DC

## 2017-06-14 MED ORDER — INSULIN PEN NEEDLE 31G X 5 MM MISC: 5 refills | Status: DC

## 2017-06-14 NOTE — Patient Instructions (Signed)
Keep up the amazing job with your weight loss! Return in 3 months

## 2017-06-14 NOTE — Progress Notes (Signed)
BP 128/80 (BP Location: Left Arm, Patient Position: Sitting, Cuff Size: Large)   Pulse 98   Temp 98.1 F (36.7 C) (Oral)   Ht 5\' 2"  (1.575 m)   Wt 220 lb (99.8 kg)   SpO2 99%   BMI 40.24 kg/m    Subjective:    Patient ID: Wendy Kent, female    DOB: 1971/03/21, 47 y.o.   MRN: [id number]  HPI: Wendy Kent is a 47 y.o. female  Chief Complaint  Patient presents with  . Follow-up    HPI Patient is here for f/u Going to have breast reduction end of March; Dr. Towanda Malkin She is pleased with finally going forward with this  HTN; well-controlled today; checks BP sometimes at pharmacy; not recently, 130 a while; no problems with the medicine  On saxenda; doing great with the medicine; no abd pain; lost 9 pounds She takes a probiotic at times to take care of any gas or bloatiness; drinking more water Been talking a lot, going up and down the steps; [facility]  Depression screen [facility] 2/9 06/14/2017 04/30/2017 03/06/2017 02/09/2016 12/22/2015  Decreased Interest 0 0 0 0 0  Down, Depressed, Hopeless 0 0 0 0 0  PHQ - 2 Score 0 0 0 0 0    Relevant past medical, surgical, family and social history reviewed Past Medical History:  Diagnosis Date  . Absolute anemia 12/29/2012  . Allergy   . Blood transfusion without reported diagnosis   . BP (high blood pressure) 12/29/2012  . Breast hypertrophy 11/10/2014  . Thyroid disease    patient was cleared by specialist. History of Thyroid nodules.   Past Surgical History:  Procedure Laterality Date  . ABDOMINAL HYSTERECTOMY     patient still has ovaries  . CHOLECYSTECTOMY    . SHOULDER ARTHROSCOPY Right   . TUBAL LIGATION     History reviewed. No pertinent family history. Social History   Tobacco Use  . Smoking status: Never Smoker  . Smokeless tobacco: Never Used  Substance Use Topics  . Alcohol use: Yes    Alcohol/week: 0.0 oz    Comment: socially  . Drug use: No    Interim medical history since last  visit reviewed. Allergies and medications reviewed  Review of Systems Per HPI unless specifically indicated above     Objective:    BP 128/80 (BP Location: Left Arm, Patient Position: Sitting, Cuff Size: Large)   Pulse 98   Temp 98.1 F (36.7 C) (Oral)   Ht 5\' 2"  (1.575 m)   Wt 220 lb (99.8 kg)   SpO2 99%   BMI 40.24 kg/m   Wt Readings from Last 3 Encounters:  06/14/17 220 lb (99.8 kg)  04/30/17 227 lb 1.6 oz (103 kg)  03/06/17 229 lb 3.2 oz (104 kg)    Physical Exam  Constitutional: She appears well-developed and well-nourished. No distress.  Morbidly obese  Eyes: EOM are normal. No scleral icterus.  Neck: No thyromegaly present.  Cardiovascular: Normal rate and regular rhythm.  No extrasystoles are present.  Pulmonary/Chest: Effort normal. No respiratory distress.  Abdominal: She exhibits no distension.  Neurological: She is alert.  Skin: No pallor.  Psychiatric: She has a normal mood and affect. Her behavior is normal. Judgment and thought content normal.      Assessment & Plan:   Problem List Items Addressed This Visit      Cardiovascular and Mediastinum   Hypertension goal BP (blood pressure) < 140/90  Controlled today        Other   Obesity, Class III, BMI 40-49.9 (morbid obesity) ([facility])    Continue weight loss efforts; continue saxenda      Relevant Medications   Liraglutide -Weight Management (SAXENDA) 18 MG/3ML SOPN   Breast hypertrophy    Upcoming breast reduction planned         Follow up plan: No Follow-up on file.  An after-visit summary was printed and given to the patient at [place].  Please see the patient instructions which may contain other information and recommendations beyond what is mentioned above in the assessment and plan.  Meds ordered this encounter  Medications  . Liraglutide -Weight Management (SAXENDA) 18 MG/3ML SOPN    Sig: Inject 3 mg into the skin daily.    Dispense:  5 pen    Refill:  0  . Insulin Pen Needle  (B-D UF III MINI PEN NEEDLES) 31G X 5 MM MISC    Sig: For use with Saxenda, one injection subcutaneously once a day    Dispense:  30 each    Refill:  5    No orders of the defined types were placed in this encounter.

## 2017-06-19 DIAGNOSIS — M546 Pain in thoracic spine: Secondary | ICD-10-CM | POA: Diagnosis not present

## 2017-06-19 DIAGNOSIS — M542 Cervicalgia: Secondary | ICD-10-CM | POA: Diagnosis not present

## 2017-06-19 DIAGNOSIS — N62 Hypertrophy of breast: Secondary | ICD-10-CM | POA: Diagnosis not present

## 2017-06-24 NOTE — Assessment & Plan Note (Signed)
Controlled today 

## 2017-06-24 NOTE — Assessment & Plan Note (Signed)
Upcoming breast reduction planned

## 2017-06-24 NOTE — Assessment & Plan Note (Signed)
Continue weight loss efforts; continue saxenda

## 2017-07-05 ENCOUNTER — Encounter: Payer: Self-pay | Admitting: Emergency Medicine

## 2017-07-05 DIAGNOSIS — Y939 Activity, unspecified: Secondary | ICD-10-CM | POA: Insufficient documentation

## 2017-07-05 DIAGNOSIS — M5489 Other dorsalgia: Secondary | ICD-10-CM | POA: Insufficient documentation

## 2017-07-05 DIAGNOSIS — Z5321 Procedure and treatment not carried out due to patient leaving prior to being seen by health care provider: Secondary | ICD-10-CM | POA: Diagnosis not present

## 2017-07-05 DIAGNOSIS — Y92410 Unspecified street and highway as the place of occurrence of the external cause: Secondary | ICD-10-CM | POA: Diagnosis not present

## 2017-07-05 DIAGNOSIS — Y998 Other external cause status: Secondary | ICD-10-CM | POA: Diagnosis not present
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
## 2017-07-06 ENCOUNTER — Emergency Department
Admission: EM | Admit: 2017-07-06 | Discharge: 2017-07-06 | Disposition: A | Discharge status: Home / Self Care | Payer: No Typology Code available for payment source | Attending: Emergency Medicine | Admitting: Emergency Medicine

## 2017-07-06 NOTE — ED Notes (Signed)
Pt walking out of lobby stating she just "cannot wait any longer"

## 2017-07-29 ENCOUNTER — Other Ambulatory Visit: Payer: Self-pay

## 2017-07-29 ENCOUNTER — Encounter (HOSPITAL_BASED_OUTPATIENT_CLINIC_OR_DEPARTMENT_OTHER): Payer: Self-pay | Admitting: *Deleted

## 2017-07-30 ENCOUNTER — Encounter (HOSPITAL_BASED_OUTPATIENT_CLINIC_OR_DEPARTMENT_OTHER)
Admission: RE | Admit: 2017-07-30 | Discharge: 2017-07-30 | Disposition: A | Discharge status: Home / Self Care | Payer: BLUE CROSS/BLUE SHIELD | Source: Ambulatory Visit | Attending: Specialist | Admitting: Specialist

## 2017-07-30 DIAGNOSIS — I10 Essential (primary) hypertension: Secondary | ICD-10-CM | POA: Insufficient documentation

## 2017-07-30 LAB — BASIC METABOLIC PANEL
Anion gap: 9 (ref 5–15)
BUN: 11 mg/dL (ref 6–20)
CO2: 27 mmol/L (ref 22–32)
Calcium: 9.3 mg/dL (ref 8.9–10.3)
Chloride: 101 mmol/L (ref 101–111)
Creatinine, Ser: 0.75 mg/dL (ref 0.44–1.00)
GFR calc Af Amer: >60 mL/min (ref >60)
GFR calc non Af Amer: >60 mL/min (ref >60)
Glucose, Bld: 90 mg/dL (ref 65–99)
Potassium: 4.1 mmol/L (ref 3.5–5.1)
Sodium: 137 mmol/L (ref 135–145)

## 2017-08-02 NOTE — H&P (Signed)
Wendy Kent is an 47 y.o. female.   Chief Complaint: Increased back and shoulder pain intertrigo HPI: Increased pitting and pain intertrigo  Past Medical History:  Diagnosis Date  . Allergy   . Blood transfusion without reported diagnosis   . BP (high blood pressure) 12/29/2012  . Breast hypertrophy 11/10/2014  . GERD (gastroesophageal reflux disease)   . Thyroid disease    patient was cleared by specialist. History of Thyroid nodules.    Past Surgical History:  Procedure Laterality Date  . ABDOMINAL HYSTERECTOMY     patient still has ovaries  . CHOLECYSTECTOMY    . SHOULDER ARTHROSCOPY Right   . TUBAL LIGATION      History reviewed. No pertinent family history. Social History:  reports that she has never smoked. She has never used smokeless tobacco. She reports that she drinks alcohol. She reports that she does not use drugs.  Allergies:  Allergies  Allergen Reactions  . Percocet [Oxycodone-Acetaminophen] Anaphylaxis    No medications prior to admission.    No results found for this or any previous visit (from the past 48 hour(s)). No results found.  ROS  Height 5\' 2"  (1.575 m), weight 98.9 kg (218 lb). Physical Exam   Assessment/Plan Severe macromastia and accessory breast tissue for bilateral breast reductions  Inayah Woodin L, MD 08/02/2017, 2:05 PM

## 2017-08-05 ENCOUNTER — Encounter (HOSPITAL_BASED_OUTPATIENT_CLINIC_OR_DEPARTMENT_OTHER)
Admission: RE | Disposition: A | Discharge status: Home / Self Care | Payer: Self-pay | Source: Ambulatory Visit | Attending: Specialist

## 2017-08-05 ENCOUNTER — Ambulatory Visit (HOSPITAL_BASED_OUTPATIENT_CLINIC_OR_DEPARTMENT_OTHER)
Admission: RE | Admit: 2017-08-05 | Discharge: 2017-08-05 | Disposition: A | Discharge status: Home / Self Care | Payer: BLUE CROSS/BLUE SHIELD | Source: Ambulatory Visit | Attending: Specialist | Admitting: Specialist

## 2017-08-05 ENCOUNTER — Encounter (HOSPITAL_BASED_OUTPATIENT_CLINIC_OR_DEPARTMENT_OTHER): Payer: Self-pay | Admitting: Emergency Medicine

## 2017-08-05 ENCOUNTER — Ambulatory Visit (HOSPITAL_BASED_OUTPATIENT_CLINIC_OR_DEPARTMENT_OTHER): Payer: BLUE CROSS/BLUE SHIELD | Admitting: Anesthesiology

## 2017-08-05 ENCOUNTER — Other Ambulatory Visit: Payer: Self-pay

## 2017-08-05 DIAGNOSIS — I10 Essential (primary) hypertension: Secondary | ICD-10-CM | POA: Diagnosis not present

## 2017-08-05 DIAGNOSIS — Z7951 Long term (current) use of inhaled steroids: Secondary | ICD-10-CM | POA: Insufficient documentation

## 2017-08-05 DIAGNOSIS — L987 Excessive and redundant skin and subcutaneous tissue: Secondary | ICD-10-CM | POA: Diagnosis not present

## 2017-08-05 DIAGNOSIS — Z6839 Body mass index (BMI) 39.0-39.9, adult: Secondary | ICD-10-CM | POA: Insufficient documentation

## 2017-08-05 DIAGNOSIS — N62 Hypertrophy of breast: Secondary | ICD-10-CM | POA: Insufficient documentation

## 2017-08-05 DIAGNOSIS — Z79899 Other long term (current) drug therapy: Secondary | ICD-10-CM | POA: Insufficient documentation

## 2017-08-05 DIAGNOSIS — E039 Hypothyroidism, unspecified: Secondary | ICD-10-CM | POA: Diagnosis not present

## 2017-08-05 DIAGNOSIS — J302 Other seasonal allergic rhinitis: Secondary | ICD-10-CM | POA: Diagnosis not present

## 2017-08-05 DIAGNOSIS — N6011 Diffuse cystic mastopathy of right breast: Secondary | ICD-10-CM | POA: Diagnosis not present

## 2017-08-05 DIAGNOSIS — N6012 Diffuse cystic mastopathy of left breast: Secondary | ICD-10-CM | POA: Diagnosis not present

## 2017-08-05 DIAGNOSIS — M546 Pain in thoracic spine: Secondary | ICD-10-CM | POA: Diagnosis not present

## 2017-08-05 DIAGNOSIS — M542 Cervicalgia: Secondary | ICD-10-CM | POA: Diagnosis not present

## 2017-08-05 HISTORY — DX: Gastro-esophageal reflux disease without esophagitis: K21.9

## 2017-08-05 HISTORY — PX: BREAST REDUCTION SURGERY: SHX8

## 2017-08-05 SURGERY — BREAST REDUCTION WITH LIPOSUCTION
Anesthesia: General | Site: Breast | Laterality: Bilateral

## 2017-08-05 MED ORDER — PROPOFOL 10 MG/ML IV BOLUS
INTRAVENOUS | Status: DC | PRN
  Administered 2017-08-05: 200 mg via INTRAVENOUS

## 2017-08-05 MED ORDER — DEXAMETHASONE SODIUM PHOSPHATE 4 MG/ML IJ SOLN
INTRAMUSCULAR | Status: DC | PRN
  Administered 2017-08-05: 10 mg via INTRAVENOUS

## 2017-08-05 MED ORDER — CEFAZOLIN SODIUM-DEXTROSE 2-4 GM/100ML-% IV SOLN
INTRAVENOUS | Status: AC
Start: 2017-08-05 — End: ?
  Filled 2017-08-05: qty 100

## 2017-08-05 MED ORDER — LIDOCAINE HCL (CARDIAC) 20 MG/ML IV SOLN
INTRAVENOUS | Status: AC
  Filled 2017-08-05: qty 5

## 2017-08-05 MED ORDER — LIDOCAINE HCL 2 % IJ SOLN
INTRAMUSCULAR | Status: DC | PRN
  Administered 2017-08-05: 200 mL

## 2017-08-05 MED ORDER — MIDAZOLAM HCL 2 MG/2ML IJ SOLN
1.0000 mg | INTRAMUSCULAR | Status: DC | PRN
  Administered 2017-08-05: 2 mg via INTRAVENOUS

## 2017-08-05 MED ORDER — ROCURONIUM BROMIDE 100 MG/10ML IV SOLN
INTRAVENOUS | Status: DC | PRN
  Administered 2017-08-05: 50 mg via INTRAVENOUS
  Administered 2017-08-05: 10 mg via INTRAVENOUS

## 2017-08-05 MED ORDER — SODIUM BICARBONATE 4 % IV SOLN
INTRAVENOUS | Status: DC | PRN
  Administered 2017-08-05: 10 mL

## 2017-08-05 MED ORDER — EPINEPHRINE PF 1 MG/ML IJ SOLN
INTRAMUSCULAR | Status: DC | PRN
  Administered 2017-08-05: 2 mL

## 2017-08-05 MED ORDER — PHENYLEPHRINE HCL 10 MG/ML IJ SOLN
INTRAMUSCULAR | Status: AC
  Filled 2017-08-05: qty 1

## 2017-08-05 MED ORDER — ACETAMINOPHEN 500 MG PO TABS
1000.0000 mg | ORAL_TABLET | Freq: Once | ORAL | Status: AC
  Administered 2017-08-05: 1000 mg via ORAL

## 2017-08-05 MED ORDER — LACTATED RINGERS IV SOLN
INTRAVENOUS | Status: DC
  Administered 2017-08-05: 08:00:00 via INTRAVENOUS

## 2017-08-05 MED ORDER — DEXAMETHASONE SODIUM PHOSPHATE 10 MG/ML IJ SOLN
INTRAMUSCULAR | Status: AC
Start: 2017-08-05 — End: ?
  Filled 2017-08-05: qty 1

## 2017-08-05 MED ORDER — FENTANYL CITRATE (PF) 100 MCG/2ML IJ SOLN
INTRAMUSCULAR | Status: AC
  Filled 2017-08-05: qty 2

## 2017-08-05 MED ORDER — HYDROMORPHONE HCL 1 MG/ML IJ SOLN
INTRAMUSCULAR | Status: AC
  Filled 2017-08-05: qty 0.5

## 2017-08-05 MED ORDER — HYDROMORPHONE HCL 1 MG/ML IJ SOLN
0.2500 mg | INTRAMUSCULAR | Status: DC | PRN
  Administered 2017-08-05: 0.5 mg via INTRAVENOUS

## 2017-08-05 MED ORDER — ACETAMINOPHEN 500 MG PO TABS
ORAL_TABLET | ORAL | Status: AC
  Filled 2017-08-05: qty 2

## 2017-08-05 MED ORDER — FENTANYL CITRATE (PF) 100 MCG/2ML IJ SOLN: 25.0000 ug | INTRAMUSCULAR | Status: DC | PRN

## 2017-08-05 MED ORDER — MEPERIDINE HCL 25 MG/ML IJ SOLN
6.2500 mg | INTRAMUSCULAR | Status: DC | PRN
Start: 2017-08-05 — End: 2017-08-05

## 2017-08-05 MED ORDER — LIDOCAINE HCL (CARDIAC) 20 MG/ML IV SOLN
INTRAVENOUS | Status: DC | PRN
  Administered 2017-08-05: 50 mg via INTRAVENOUS

## 2017-08-05 MED ORDER — LIDOCAINE HCL 2 % IJ SOLN
INTRAMUSCULAR | Status: AC
  Filled 2017-08-05: qty 20

## 2017-08-05 MED ORDER — MIDAZOLAM HCL 2 MG/2ML IJ SOLN
INTRAMUSCULAR | Status: AC
  Filled 2017-08-05: qty 2

## 2017-08-05 MED ORDER — ACETAMINOPHEN 160 MG/5ML PO SOLN: 960.0000 mg | Freq: Once | ORAL | Status: DC

## 2017-08-05 MED ORDER — FENTANYL CITRATE (PF) 100 MCG/2ML IJ SOLN
50.0000 ug | INTRAMUSCULAR | Status: AC | PRN
  Administered 2017-08-05 (×3): 50 ug via INTRAVENOUS
  Administered 2017-08-05: 100 ug via INTRAVENOUS
  Administered 2017-08-05: 50 ug via INTRAVENOUS

## 2017-08-05 MED ORDER — SUGAMMADEX SODIUM 200 MG/2ML IV SOLN
INTRAVENOUS | Status: DC | PRN
  Administered 2017-08-05: 200 mg via INTRAVENOUS

## 2017-08-05 MED ORDER — LACTATED RINGERS IV SOLN
INTRAVENOUS | Status: AC | PRN
  Administered 2017-08-05: 1000 mL

## 2017-08-05 MED ORDER — ONDANSETRON HCL 4 MG/2ML IJ SOLN
INTRAMUSCULAR | Status: DC | PRN
  Administered 2017-08-05: 4 mg via INTRAVENOUS

## 2017-08-05 MED ORDER — CHLORHEXIDINE GLUCONATE CLOTH 2 % EX PADS: 6.0000 | MEDICATED_PAD | Freq: Once | CUTANEOUS | Status: DC

## 2017-08-05 MED ORDER — LIDOCAINE HCL 2 % IJ SOLN
INTRAMUSCULAR | Status: AC
  Filled 2017-08-05: qty 40

## 2017-08-05 MED ORDER — PROMETHAZINE HCL 25 MG/ML IJ SOLN: 6.2500 mg | INTRAMUSCULAR | Status: DC | PRN

## 2017-08-05 MED ORDER — ONDANSETRON HCL 4 MG/2ML IJ SOLN
INTRAMUSCULAR | Status: AC
  Filled 2017-08-05: qty 2

## 2017-08-05 MED ORDER — ROCURONIUM BROMIDE 10 MG/ML (PF) SYRINGE
PREFILLED_SYRINGE | INTRAVENOUS | Status: AC
  Filled 2017-08-05: qty 5

## 2017-08-05 MED ORDER — SCOPOLAMINE 1 MG/3DAYS TD PT72: 1.0000 | MEDICATED_PATCH | Freq: Once | TRANSDERMAL | Status: DC | PRN

## 2017-08-05 MED ORDER — EPINEPHRINE 30 MG/30ML IJ SOLN
INTRAMUSCULAR | Status: AC
  Filled 2017-08-05: qty 1

## 2017-08-05 MED ORDER — SUGAMMADEX SODIUM 200 MG/2ML IV SOLN
INTRAVENOUS | Status: AC
  Filled 2017-08-05: qty 2

## 2017-08-05 MED ORDER — SODIUM BICARBONATE 4 % IV SOLN
INTRAVENOUS | Status: AC
  Filled 2017-08-05: qty 5

## 2017-08-05 MED ORDER — CEFAZOLIN SODIUM-DEXTROSE 2-4 GM/100ML-% IV SOLN
2.0000 g | INTRAVENOUS | Status: AC
  Administered 2017-08-05: 2 g via INTRAVENOUS

## 2017-08-05 MED ORDER — DEXTROSE 5 % IV SOLN
INTRAVENOUS | Status: DC | PRN
  Administered 2017-08-05: 50 ug/min via INTRAVENOUS

## 2017-08-05 SURGICAL SUPPLY — 69 items
BAG DECANTER FOR FLEXI CONT (MISCELLANEOUS) ×2 IMPLANT
BENZOIN TINCTURE PRP APPL 2/3 (GAUZE/BANDAGES/DRESSINGS) ×4 IMPLANT
BINDER BREAST LRG (GAUZE/BANDAGES/DRESSINGS) IMPLANT
BINDER BREAST XLRG (GAUZE/BANDAGES/DRESSINGS) IMPLANT
BINDER BREAST XXLRG (GAUZE/BANDAGES/DRESSINGS) ×2 IMPLANT
BLADE KNIFE  20 PERSONNA (BLADE) ×4
BLADE KNIFE 20 PERSONNA (BLADE) ×2 IMPLANT
BLADE KNIFE PERSONA 10 (BLADE) ×4 IMPLANT
BLADE KNIFE PERSONA 15 (BLADE) ×4 IMPLANT
CANISTER SUCT 1200ML W/VALVE (MISCELLANEOUS) ×2 IMPLANT
COVER BACK TABLE 60X90IN (DRAPES) ×2 IMPLANT
COVER MAYO STAND STRL (DRAPES) ×2 IMPLANT
DECANTER SPIKE VIAL GLASS SM (MISCELLANEOUS) ×4 IMPLANT
DRAIN CHANNEL 10F 3/8 F FF (DRAIN) ×4 IMPLANT
DRAPE LAPAROSCOPIC ABDOMINAL (DRAPES) ×4 IMPLANT
DRSG PAD ABDOMINAL 8X10 ST (GAUZE/BANDAGES/DRESSINGS) ×8 IMPLANT
ELECT REM PT RETURN 9FT ADLT (ELECTROSURGICAL) ×2
ELECTRODE REM PT RTRN 9FT ADLT (ELECTROSURGICAL) ×1 IMPLANT
EVACUATOR SILICONE 100CC (DRAIN) ×4 IMPLANT
GAUZE SPONGE 4X4 12PLY STRL (GAUZE/BANDAGES/DRESSINGS) ×4 IMPLANT
GAUZE XEROFORM 5X9 LF (GAUZE/BANDAGES/DRESSINGS) ×4 IMPLANT
GLOVE BIO SURGEON STRL SZ 6.5 (GLOVE) ×2 IMPLANT
GLOVE BIOGEL M STRL SZ7.5 (GLOVE) ×2 IMPLANT
GLOVE BIOGEL PI IND STRL 8 (GLOVE) ×1 IMPLANT
GLOVE BIOGEL PI INDICATOR 8 (GLOVE) ×1
GLOVE ECLIPSE 7.0 STRL STRAW (GLOVE) ×2 IMPLANT
GOWN STRL REUS W/ TWL LRG LVL3 (GOWN DISPOSABLE) IMPLANT
GOWN STRL REUS W/ TWL XL LVL3 (GOWN DISPOSABLE) ×2 IMPLANT
GOWN STRL REUS W/TWL LRG LVL3 (GOWN DISPOSABLE)
GOWN STRL REUS W/TWL XL LVL3 (GOWN DISPOSABLE) ×4
IV LACTATED RINGERS 1000ML (IV SOLUTION) ×4 IMPLANT
IV NS 500ML (IV SOLUTION)
IV NS 500ML BAXH (IV SOLUTION) IMPLANT
LINER CANISTER 1000CC FLEX (MISCELLANEOUS) IMPLANT
MARKER SKIN DUAL TIP RULER LAB (MISCELLANEOUS) ×2 IMPLANT
NDL SAFETY ECLIPSE 18X1.5 (NEEDLE) ×1 IMPLANT
NEEDLE HYPO 18GX1.5 SHARP (NEEDLE) ×2
NEEDLE SPNL 18GX3.5 QUINCKE PK (NEEDLE) IMPLANT
NS IRRIG 1000ML POUR BTL (IV SOLUTION) ×4 IMPLANT
PACK BASIN DAY SURGERY FS (CUSTOM PROCEDURE TRAY) ×2 IMPLANT
PAD ALCOHOL SWAB (MISCELLANEOUS) IMPLANT
PEN SKIN MARKING BROAD TIP (MISCELLANEOUS) ×2 IMPLANT
PIN SAFETY STERILE (MISCELLANEOUS) ×2 IMPLANT
SHEETING SILICONE GEL EPI DERM (MISCELLANEOUS) IMPLANT
SLEEVE SCD COMPRESS KNEE MED (MISCELLANEOUS) ×2 IMPLANT
SPECIMEN JAR MEDIUM (MISCELLANEOUS) IMPLANT
SPECIMEN JAR X LARGE (MISCELLANEOUS) ×4 IMPLANT
SPONGE LAP 18X18 RF (DISPOSABLE) ×8 IMPLANT
STRIP SUTURE WOUND CLOSURE 1/2 (SUTURE) ×10 IMPLANT
SUT MNCRL AB 3-0 PS2 18 (SUTURE) ×12 IMPLANT
SUT MON AB 2-0 CT1 36 (SUTURE) IMPLANT
SUT MON AB 5-0 PS2 18 (SUTURE) ×4 IMPLANT
SUT PROLENE 3 0 PS 2 (SUTURE) ×12 IMPLANT
SUT VLOC 90 P-14 23 (SUTURE) ×8 IMPLANT
SYR 20CC LL (SYRINGE) IMPLANT
SYR 50ML LL SCALE MARK (SYRINGE) ×4 IMPLANT
SYR CONTROL 10ML LL (SYRINGE) IMPLANT
TAPE HYPAFIX 6X30 (GAUZE/BANDAGES/DRESSINGS) ×2 IMPLANT
TAPE MEASURE 72IN RETRACT (INSTRUMENTS)
TAPE MEASURE LINEN 72IN RETRCT (INSTRUMENTS) IMPLANT
TAPE PAPER 1X10 WHT MICROPORE (GAUZE/BANDAGES/DRESSINGS) ×2 IMPLANT
TOWEL OR 17X24 6PK STRL BLUE (TOWEL DISPOSABLE) ×8 IMPLANT
TRAY DSU PREP LF (CUSTOM PROCEDURE TRAY) ×4 IMPLANT
TUBE CONNECTING 20X1/4 (TUBING) ×2 IMPLANT
TUBING INFILTRATION IT-10001 (TUBING) IMPLANT
TUBING SET GRADUATE ASPIR 12FT (MISCELLANEOUS) IMPLANT
UNDERPAD 30X30 (UNDERPADS AND DIAPERS) ×4 IMPLANT
VAC PENCILS W/TUBING CLEAR (MISCELLANEOUS) ×2 IMPLANT
YANKAUER SUCT BULB TIP NO VENT (SUCTIONS) ×2 IMPLANT

## 2017-08-05 NOTE — Discharge Instructions (Signed)
Breast Reduction Care After Refer to this sheet in the next few weeks. These instructions provide you with information on caring for yourself after your procedure. Your caregiver may also give you more specific instructions. Your treatment has been planned according to current medical practices, but problems sometimes occur. Call your caregiver if you have any problems or questions after your procedure. HOME CARE INSTRUCTIONS  Do not lift more than 5 pounds with one arm, or 10 pounds with both arms, for 1 month.   Do not sleep on your stomach for 4 to 6 weeks.   Do not do vigorous exercise such as bouncing, aerobics, or jumping for 6 weeks. Walking is not restricted.   Do not drive while you are taking prescription pain medicine.   Avoid prolonged sun exposure.   Keep dressings dry and clean  Measure jp drainage every 12 hrs and measure   You may slowly go back to your normal diet. Start with a light meal and increase as comfortable.   You may shower 24 hours after your drains are removed unless instructed differently by your caregiver.   Take your pain medicine as prescribed. Discomfort is normal after breast reduction surgery.   Keep the head of your bed elevated 40 degrees   : Call the office if you notice:  You have a fever.   You notice drainage from the incision that smells bad.   You have persistent pain.   You have persistent bleeding from the incision or nipple discharge.   You develop increased swelling or swelling that is greater in one breast than in the other.  MAKE SURE YOU:   Understand these instructions.   Will watch your condition.   Will get help right away if you are not doing well or get worse.  Document Released: 11/22/2003 Document Revised: 12/20/2010 Document Reviewed: 07/03/2007 [facility] Patient Information 2012 [place].  Last Dose of Tylenol 1000mg  taken at 8:35AM   Post Anesthesia Home Care Instructions  Activity: Get plenty of  rest for the remainder of the day. A responsible individual must stay with you for 24 hours following the procedure.  For the next 24 hours, DO NOT: -Drive a car -Paediatric nurse -Drink alcoholic beverages -Take any medication unless instructed by your physician -Make any legal decisions or sign important papers.  Meals: Start with liquid foods such as gelatin or soup. Progress to regular foods as tolerated. Avoid greasy, spicy, heavy foods. If nausea and/or vomiting occur, drink only clear liquids until the nausea and/or vomiting subsides. Call your physician if vomiting continues.  Special Instructions/Symptoms: Your throat may feel dry or sore from the anesthesia or the breathing tube placed in your throat during surgery. If this causes discomfort, gargle with warm salt water. The discomfort should disappear within 24 hours.  If you had a scopolamine patch placed behind your ear for the management of post- operative nausea and/or vomiting:  1. The medication in the patch is effective for 72 hours, after which it should be removed.  Wrap patch in a tissue and discard in the trash. Wash hands thoroughly with soap and water. 2. You may remove the patch earlier than 72 hours if you experience unpleasant side effects which may include dry mouth, dizziness or visual disturbances. 3. Avoid touching the patch. Wash your hands with soap and water after contact with the patch.     JP Drain [employer] this sheet to all of your post-operative appointments while you have your drains.  Please measure your drains by CC's or ML's.  Make sure you drain and measure your JP Drains 2 or 3 times per day.  At the end of each day, add up totals for the left side and add up totals for the right side.    ( 9 am )     ( 3 pm )        ( 9 pm )                Date L  R  L  R  L  R  Total L/R

## 2017-08-05 NOTE — Brief Op Note (Signed)
08/05/2017  1:19 PM  PATIENT:  Wendy Kent  46 y.o. female  PRE-OPERATIVE DIAGNOSIS:  MACROMASTIA  POST-OPERATIVE DIAGNOSIS:  MACROMASTIA  PROCEDURE:  Procedure(s): BREAST REDUCTION WITH LIPOSUCTION (Bilateral)  SURGEON:  Surgeon(s) and Role:    Cristine Polio, MD - Primary  PHYSICIAN ASSISTANT:   ASSISTANTS: none   ANESTHESIA:   general  EBL:  50 mL   BLOOD ADMINISTERED:none  DRAINS: (10 fully fluteed bilateral) Jackson-Pratt drain(s) with closed bulb suction in the 10 fully fluted   LOCAL MEDICATIONS USED:  LIDOCAINE   SPECIMEN:  Excision  DISPOSITION OF SPECIMEN:  PATHOLOGY  COUNTS:  YES  TOURNIQUET:  * No tourniquets in log *  DICTATION: .Other Dictation: Dictation Number [id number]  PLAN OF CARE: Discharge to home after PACU  PATIENT DISPOSITION:  PACU - hemodynamically stable.   Delay start of Pharmacological VTE agent (>24hrs) due to surgical blood loss or risk of bleeding: yes

## 2017-08-05 NOTE — Transfer of Care (Signed)
Immediate Anesthesia Transfer of Care Note  Patient: Wendy Kent  Procedure(s) Performed: BREAST REDUCTION WITH LIPOSUCTION (Bilateral Breast)  Patient Location: PACU  Anesthesia Type:General  Level of Consciousness: awake, alert  and oriented  Airway & Oxygen Therapy: Patient Spontanous Breathing and Patient connected to face mask oxygen  Post-op Assessment: Report given to RN and Post -op Vital signs reviewed and stable  Post vital signs: Reviewed and stable  Last Vitals:  Vitals Value Taken Time  BP 124/79 08/05/2017  1:38 PM  Temp    Pulse 117 08/05/2017  1:41 PM  Resp    SpO2 95 % 08/05/2017  1:41 PM  Vitals shown include unvalidated device data.  Last Pain:  Vitals:   08/05/17 0726  TempSrc: Oral         Complications: No apparent anesthesia complications

## 2017-08-05 NOTE — Anesthesia Procedure Notes (Signed)
Procedure Name: Intubation Performed by: Terrance Mass, CRNA Pre-anesthesia Checklist: Patient identified, Emergency Drugs available, Suction available and Patient being monitored Patient Re-evaluated:Patient Re-evaluated prior to induction Oxygen Delivery Method: Circle system utilized Preoxygenation: Pre-oxygenation with 100% oxygen Induction Type: IV induction Ventilation: Mask ventilation without difficulty Laryngoscope Size: Miller and 2 Grade View: Grade II Tube type: Oral Tube size: 7.0 mm Number of attempts: 1 Airway Equipment and Method: Stylet and Oral airway Placement Confirmation: ETT inserted through vocal cords under direct vision,  positive ETCO2 and breath sounds checked- equal and bilateral Secured at: 22 cm Tube secured with: Tape Dental Injury: Teeth and Oropharynx as per pre-operative assessment

## 2017-08-05 NOTE — Anesthesia Postprocedure Evaluation (Signed)
Anesthesia Post Note  Patient: Coralyn Roselli  Procedure(s) Performed: BREAST REDUCTION WITH LIPOSUCTION (Bilateral Breast)     Patient location during evaluation: PACU Anesthesia Type: General Level of consciousness: awake and alert Pain management: pain level controlled Vital Signs Assessment: post-procedure vital signs reviewed and stable Respiratory status: spontaneous breathing, nonlabored ventilation and respiratory function stable Cardiovascular status: blood pressure returned to baseline and stable Postop Assessment: no apparent nausea or vomiting Anesthetic complications: no    Last Vitals:  Vitals:   08/05/17 1500 08/05/17 1515  BP: 114/68 114/77  Pulse: (!) 103 (!) 112  Resp: 18 16  Temp:  37.2 C  SpO2: 94% 95%    Last Pain:  Vitals:   08/05/17 1515  TempSrc:   PainSc: 2                  Lynda Rainwater

## 2017-08-05 NOTE — Anesthesia Preprocedure Evaluation (Signed)
Anesthesia Evaluation  Patient identified by MRN, date of birth, ID band Patient awake    Reviewed: Allergy & Precautions, H&P , Patient's Chart, lab work & pertinent test results, reviewed documented beta blocker date and time   Airway Mallampati: II  TM Distance: >3 FB Neck ROM: full    Dental no notable dental hx.    Pulmonary    Pulmonary exam normal breath sounds clear to auscultation       Cardiovascular hypertension,  Rhythm:regular Rate:Normal     Neuro/Psych    GI/Hepatic   Endo/Other  Morbid obesity  Renal/GU      Musculoskeletal   Abdominal   Peds  Hematology   Anesthesia Other Findings   Reproductive/Obstetrics                             Anesthesia Physical Anesthesia Plan  ASA: III  Anesthesia Plan: General   Post-op Pain Management:    Induction: Intravenous  PONV Risk Score and Plan: Ondansetron, Treatment may vary due to age or medical condition and Scopolamine patch - Pre-op  Airway Management Planned: Oral ETT  Additional Equipment:   Intra-op Plan:   Post-operative Plan: Extubation in OR  Informed Consent: I have reviewed the patients History and Physical, chart, labs and discussed the procedure including the risks, benefits and alternatives for the proposed anesthesia with the patient or authorized representative who has indicated his/her understanding and acceptance.   Dental Advisory Given  Plan Discussed with: CRNA and Surgeon  Anesthesia Plan Comments: (  )        Anesthesia Quick Evaluation

## 2017-08-06 ENCOUNTER — Encounter (HOSPITAL_BASED_OUTPATIENT_CLINIC_OR_DEPARTMENT_OTHER): Payer: Self-pay | Admitting: Specialist

## 2017-08-06 NOTE — Op Note (Signed)
NAMEMONASIA, LAIR            ACCOUNT NO.:  [id number]  MEDICAL RECORD NO.:  [id number]  LOCATION:                                 FACILITY:  PHYSICIAN:  Berneta Sages L. Towanda Malkin, M.D.   DATE OF BIRTH:  DATE OF PROCEDURE:  08/05/2017 DATE OF DISCHARGE:                              OPERATIVE REPORT   A 47 year old lady with severe, severe macromastia, back and shoulder pain secondary to large pendulous breasts, intertriginous changes by history, wears a bra __________ 3-4 months.  Also, increased accessory breast tissue as well.  PROCEDURES DONE:  Excision of accessory breast tissue, excision of severe macromastia.  Total removal was 700 g per side using the inferior pedicle technique.  Preoperatively, the patient underwent drawings for the inferior pedicle technique remarking the nipple-areolar complex from over 40 cm up to 24. She underwent general anesthesia, was intubated orally.  Prep was done to the chest and breast areas in a routine fashion using Hibiclens soap and solution and walled off with sterile towels and drapes so as to make a sterile field.  Tumescent was injected into the right and left sides, total of 1500 mL.  This was allowed to sit up.  The wounds were scored with a #15 blade and the skin of the inferior pedicle was de- epithelialized with #20 blades.  Hemostasis was maintained with the Bovie unit on coagulation.  Next, medial and lateral fatty dermal pedicles were excised down the underlying pectoralis major fascia. Bleeders were controlled with the Bovie unit on coagulation.  Excess tissue was removed.  Next, in the accessory breast tissue area, excess tissue was removed likewise.  Next, the new keyhole area was debulked and placed in place and secured with 3-0 Prolene sutures.  The lateral tissue was removed as well to improve the contour.  After proper hemostasis, the wounds then were reclosed with 3-0 Monocryl x2 layers subcutaneously and then a VTEK  3-0 was used to run across and mid circle and the vertical lines with good closure.  The wounds were drained with a #10 fully fluted Blake drains, which were placed in the depths of the wounds and brought out through the lateral-most portion of the incision, secured with 3-0 Prolene.  The wounds were cleansed.  Steri-Strips, soft dressing applied to all the areas.  She withstood the procedures very, very well and was taken to recovery in excellent condition.  Estimated blood loss less than 100 mL.  Complications none.  After sterile dressing __________ placed, she was taken to recovery in good condition.     Odella Aquas. Towanda Malkin, M.D.   ______________________________ Odella Aquas. Towanda Malkin, M.D.    Elie Confer  D:  08/05/2017  T:  08/05/2017  Job:  [id number]

## 2017-08-12 ENCOUNTER — Other Ambulatory Visit: Payer: Self-pay | Admitting: Family Medicine

## 2017-08-13 ENCOUNTER — Telehealth: Payer: Self-pay | Admitting: Family Medicine

## 2017-08-13 NOTE — Telephone Encounter (Signed)
Called the office of Dr.Truesdale, LM on the nurses line asking if it is ok for pt to resume Saxenda.

## 2017-08-13 NOTE — Telephone Encounter (Signed)
Copied from [place] [phone]. Topic: Quick Communication - Rx Refill/Question >> Aug 13, 2017  9:51 AM Selinda Flavin B, NT wrote: Medication: Liraglutide -Weight Management ([place]) 18 MG/3ML SOPN Has the patient contacted their pharmacy? Yes.   (Agent: If no, request that the patient contact the pharmacy for the refill.) Preferred Pharmacy (with phone number or street name): [id number] [place], [place] - [address]: Please be advised that RX refills may take up to 3 business days. We ask that you follow-up with your pharmacy.

## 2017-08-13 NOTE — Telephone Encounter (Signed)
Called office of Dr.Truesdale, no answer. Will call back again.

## 2017-08-13 NOTE — Telephone Encounter (Signed)
Please check with surgeon Dr. Neita Goodnight instructions were to stop the [place] when she left the hospital Not sure why or if he thinks she can restart at a certain time; we just need guidance on this

## 2017-08-22 NOTE — Telephone Encounter (Signed)
Please give patient a sample until Dr. Sanda Klein is back , she needs to start low and titrate up again.

## 2017-08-22 NOTE — Telephone Encounter (Signed)
Pt calling to check status of saxenda. Pt ran out last Friday 08/16/17. She again stated that Dr. Neita Goodnight office said she could resume using it. She said she was told not to take medication the day of surgery but resumed medication the day after surgery (08/06/17). Pt is requesting follow up.

## 2017-08-23 NOTE — Telephone Encounter (Signed)
Pt.notified

## 2017-08-26 ENCOUNTER — Other Ambulatory Visit: Payer: Self-pay | Admitting: Family Medicine

## 2017-08-26 MED ORDER — LIRAGLUTIDE -WEIGHT MANAGEMENT 18 MG/3ML ~~LOC~~ SOPN: 0.6000 mg | PEN_INJECTOR | Freq: Every day | SUBCUTANEOUS | 0 refills | Status: DC

## 2017-08-26 NOTE — Telephone Encounter (Signed)
Will taper her back up

## 2017-08-26 NOTE — Telephone Encounter (Signed)
Due to surgery and she was told she could start back afterwards

## 2017-08-26 NOTE — Telephone Encounter (Signed)
Please find out why this medicine Kirke Shaggy) was stopped by Dr. Towanda Malkin Thank you

## 2017-09-12 ENCOUNTER — Ambulatory Visit: Payer: Medicaid Other | Admitting: Family Medicine

## 2017-09-12 ENCOUNTER — Encounter: Payer: Self-pay | Admitting: Family Medicine

## 2017-09-12 ENCOUNTER — Ambulatory Visit (INDEPENDENT_AMBULATORY_CARE_PROVIDER_SITE_OTHER): Payer: BLUE CROSS/BLUE SHIELD | Admitting: Family Medicine

## 2017-09-12 DIAGNOSIS — I517 Cardiomegaly: Secondary | ICD-10-CM | POA: Diagnosis not present

## 2017-09-12 DIAGNOSIS — E669 Obesity, unspecified: Secondary | ICD-10-CM | POA: Diagnosis not present

## 2017-09-12 DIAGNOSIS — E0590 Thyrotoxicosis, unspecified without thyrotoxic crisis or storm: Secondary | ICD-10-CM

## 2017-09-12 DIAGNOSIS — J302 Other seasonal allergic rhinitis: Secondary | ICD-10-CM

## 2017-09-12 DIAGNOSIS — I10 Essential (primary) hypertension: Secondary | ICD-10-CM

## 2017-09-12 MED ORDER — FLUTICASONE PROPIONATE 50 MCG/ACT NA SUSP: 2.0000 | Freq: Every day | NASAL | 3 refills | Status: DC | PRN

## 2017-09-12 NOTE — Assessment & Plan Note (Addendum)
New category! No longer in the morbidly obese category, shared with patient. Praise given for patient's amazing efforts at losing weight; continue saxenda; return in 3 months; no need right now for dietician

## 2017-09-12 NOTE — Progress Notes (Signed)
BP 112/82   Pulse 100   Temp 98.2 F (36.8 C) (Oral)   Resp 14   Ht 5\' 2"  (1.575 m)   Wt 212 lb 9.6 oz (96.4 kg)   SpO2 96%   BMI 38.89 kg/m    Subjective:    Patient ID: Wendy Kent, female    DOB: Apr 08, 1971, 47 y.o.   MRN: [id number]  HPI: Wendy Kent is a 47 y.o. female  Chief Complaint  Patient presents with  . Follow-up    HPI Patient is here for f/u Obesity; taking a probiotic Using the saxenda She and coworker working together Drinking slimquick pure and that tastes great, she says; it has green tea in it Loading up on water and benny lo green tea and drinking that without sugar Drinks sweet tea; ginger ale with cranberry She is going to the mall and outlets and shopping and getting more walking Even her shoes feel different, even center of gravity Her long-term goal is 175 pounds; not trying to do it all at once She was found in the past to have cardiomegaly, HTN  She recently underwent reduction mammoplasty I feel great I have so much energy she says It took a little bit of time The surgery went well She does not have to see the surgeon for six weeks Breathing is much better; not feeling like she is suffocating Back and shoulder pain is gone No fevers  Depression screen [facility] 2/9 09/12/2017 06/14/2017 04/30/2017 03/06/2017 02/09/2016  Decreased Interest 0 0 0 0 0  Down, Depressed, Hopeless 0 0 0 0 0  PHQ - 2 Score 0 0 0 0 0    Relevant past medical, surgical, family and social history reviewed Past Medical History:  Diagnosis Date  . Allergy   . Blood transfusion without reported diagnosis   . BP (high blood pressure) 12/29/2012  . Breast hypertrophy 11/10/2014  . GERD (gastroesophageal reflux disease)   . Thyroid disease    patient was cleared by specialist. History of Thyroid nodules.   Past Surgical History:  Procedure Laterality Date  . ABDOMINAL HYSTERECTOMY     patient still has ovaries  . BREAST REDUCTION SURGERY Bilateral  08/05/2017   Procedure: BREAST REDUCTION WITH LIPOSUCTION;  Surgeon: Cristine Polio, MD;  Location: [facility];  Service: Plastics;  Laterality: Bilateral;  . CHOLECYSTECTOMY    . SHOULDER ARTHROSCOPY Right   . TUBAL LIGATION     History reviewed. No pertinent family history. Social History   Tobacco Use  . Smoking status: Never Smoker  . Smokeless tobacco: Never Used  Substance Use Topics  . Alcohol use: Yes    Alcohol/week: 0.0 oz    Comment: socially  . Drug use: No    Interim medical history since last visit reviewed. Allergies and medications reviewed  Review of Systems Per HPI unless specifically indicated above     Objective:    BP 112/82   Pulse 100   Temp 98.2 F (36.8 C) (Oral)   Resp 14   Ht 5\' 2"  (1.575 m)   Wt 212 lb 9.6 oz (96.4 kg)   SpO2 96%   BMI 38.89 kg/m   Wt Readings from Last 3 Encounters:  09/12/17 212 lb 9.6 oz (96.4 kg)  08/05/17 216 lb 12.8 oz (98.3 kg)  06/14/17 220 lb (99.8 kg)    Physical Exam  Constitutional: She appears well-developed and well-nourished.  Obese, but weight loss noted  HENT:  Mouth/Throat: Mucous membranes are normal.  Eyes: EOM are normal. No scleral icterus.  Cardiovascular: Normal rate and regular rhythm.  Pulmonary/Chest: Effort normal and breath sounds normal.  Psychiatric: She has a normal mood and affect. Her behavior is normal.    Results for orders placed or performed during the hospital encounter of 92/92/44  Basic metabolic panel  Result Value Ref Range   Sodium 137 135 - 145 mmol/L   Potassium 4.1 3.5 - 5.1 mmol/L   Chloride 101 101 - 111 mmol/L   CO2 27 22 - 32 mmol/L   Glucose, Bld 90 65 - 99 mg/dL   BUN 11 6 - 20 mg/dL   Creatinine, Ser 0.75 0.44 - 1.00 mg/dL   Calcium 9.3 8.9 - 10.3 mg/dL   GFR calc non Af Amer >60 >60 mL/min   GFR calc Af Amer >60 >60 mL/min   Anion gap 9 5 - 15      Assessment & Plan:   Problem List Items Addressed This Visit      Cardiovascular  and Mediastinum   Hypertension goal BP (blood pressure) < 140/90 (Chronic)    Controlled      Cardiomegaly    Weight loss is key        Respiratory   Allergic rhinitis, seasonal    Nasal corticosteroid prescribed      Relevant Medications   fluticasone (FLONASE) 50 MCG/ACT nasal spray     Endocrine   Hyperthyroidism, subclinical    Last TSH reviewed; consider TSH at next visit        Other   Obesity (BMI 35.0-39.9 without comorbidity)    New category! No longer in the morbidly obese category, shared with patient. Praise given for patient's amazing efforts at losing weight; continue saxenda; return in 3 months; no need right now for dietician          Follow up plan: Return in about 3 months (around 12/13/2017).  An after-visit summary was printed and given to the patient at [facility].  Please see the patient instructions which may contain other information and recommendations beyond what is mentioned above in the assessment and plan.  Meds ordered this encounter  Medications  . fluticasone (FLONASE) 50 MCG/ACT nasal spray    Sig: Place 2 sprays into both nostrils daily as needed.    Dispense:  48 g    Refill:  3    No orders of the defined types were placed in this encounter.

## 2017-09-12 NOTE — Patient Instructions (Signed)
Keep up the great job with your weight loss efforts Drink plenty of water Continue the current Saxenda

## 2017-09-17 NOTE — Assessment & Plan Note (Signed)
Nasal corticosteroid prescribed

## 2017-09-17 NOTE — Assessment & Plan Note (Signed)
Controlled.  

## 2017-09-17 NOTE — Assessment & Plan Note (Signed)
Weight loss is key

## 2017-09-17 NOTE — Assessment & Plan Note (Signed)
Last TSH reviewed; consider TSH at next visit

## 2017-10-01 ENCOUNTER — Telehealth: Payer: Self-pay | Admitting: Family Medicine

## 2017-10-01 NOTE — Telephone Encounter (Signed)
Copied from [place] [phone]. Topic: General - Other >> Oct 01, 2017  3:54 PM Wendy Kent, NT wrote: Reason for CRM: The patient wants Dr Sanda Klein to know she is going to [facility] [employer] and getting b 12 shots , once a week and take adapex pills  for weight loss,she would like a call back to discuss [phone]

## 2017-10-02 NOTE — Telephone Encounter (Signed)
If that her is choice, STOP Saxenda She should NOT take that and undergo another weight loss program elsewhere I do not recommend Adipex; I have never prescribed it and never will; I'll encourage her to read about the side effects and risks thoroughly and discuss them with the diet clinic provider and the pharmacist

## 2017-10-03 NOTE — Telephone Encounter (Signed)
Left detailed voicemail

## 2017-10-23 ENCOUNTER — Other Ambulatory Visit: Payer: Self-pay

## 2017-10-23 DIAGNOSIS — I10 Essential (primary) hypertension: Secondary | ICD-10-CM

## 2017-10-23 MED ORDER — HYDROCHLOROTHIAZIDE 25 MG PO TABS: 25.0000 mg | ORAL_TABLET | Freq: Every day | ORAL | 1 refills | Status: DC

## 2017-10-23 NOTE — Telephone Encounter (Signed)
Refill request was sent to Dr. Melinda Lada for approval and submission.  

## 2017-12-04 ENCOUNTER — Other Ambulatory Visit: Payer: Self-pay | Admitting: Family Medicine

## 2017-12-05 NOTE — Telephone Encounter (Signed)
Pt says that she is done taking Adapex, and would like to start back taking the Saxenda. She said that she finished taking dose at 2.4    CB:  [id number] - [place], [place] - [address] [phone] (Phone) [phone] (Fax)   Pt would like to

## 2017-12-06 NOTE — Telephone Encounter (Signed)
Her last appt was almost 3 months ago We'll need to talk about the adipex and medically assisted weight loss Please schedule her an appointment

## 2017-12-06 NOTE — Telephone Encounter (Signed)
Left voice message on CB # [phone] informed pt to keep her upcoming appt

## 2017-12-13 ENCOUNTER — Encounter: Payer: Self-pay | Admitting: Family Medicine

## 2017-12-13 ENCOUNTER — Ambulatory Visit (INDEPENDENT_AMBULATORY_CARE_PROVIDER_SITE_OTHER): Payer: BLUE CROSS/BLUE SHIELD | Admitting: Family Medicine

## 2017-12-13 VITALS — BP 124/78 | HR 98 | Temp 98.4°F | Ht 62.0 in | Wt 214.8 lb

## 2017-12-13 DIAGNOSIS — R140 Abdominal distension (gaseous): Secondary | ICD-10-CM

## 2017-12-13 DIAGNOSIS — E042 Nontoxic multinodular goiter: Secondary | ICD-10-CM | POA: Diagnosis not present

## 2017-12-13 DIAGNOSIS — R109 Unspecified abdominal pain: Secondary | ICD-10-CM | POA: Diagnosis not present

## 2017-12-13 DIAGNOSIS — K580 Irritable bowel syndrome with diarrhea: Secondary | ICD-10-CM

## 2017-12-13 DIAGNOSIS — D509 Iron deficiency anemia, unspecified: Secondary | ICD-10-CM

## 2017-12-13 DIAGNOSIS — E669 Obesity, unspecified: Secondary | ICD-10-CM

## 2017-12-13 MED ORDER — LIRAGLUTIDE -WEIGHT MANAGEMENT 18 MG/3ML ~~LOC~~ SOPN: 0.6000 mg | PEN_INJECTOR | Freq: Every day | SUBCUTANEOUS | 0 refills | Status: DC

## 2017-12-13 MED ORDER — INSULIN PEN NEEDLE 31G X 5 MM MISC: 5 refills | Status: DC

## 2017-12-13 NOTE — Patient Instructions (Addendum)
Start back on the Saxenda and taper up We'll have you see the nutritionist Check out the information at familydoctor.org entitled "Nutrition for Weight Loss: What You Need to Know about Fad Diets" Try to lose between 1-2 pounds per week by taking in fewer calories and burning off more calories You can succeed by limiting portions, limiting foods dense in calories and fat, becoming more active, and drinking 8 glasses of water a day (64 ounces) Don't skip meals, especially breakfast, as skipping meals may alter your metabolism Do not use over-the-counter weight loss pills or gimmicks that claim rapid weight loss A healthy BMI (or body mass index) is between 18.5 and 24.9 You can calculate your ideal BMI at the [place] website ClubMonetize.fr Let's start with exercise 3 days a week  Preventing Unhealthy Weight Gain, Adult Staying at a healthy weight is important. When fat builds up in your body, you may become overweight or obese. These conditions put you at greater risk for developing certain health problems, such as heart disease, diabetes, sleeping problems, joint problems, and some cancers. Unhealthy weight gain is often the result of making unhealthy choices in what you eat. It is also a result of not getting enough exercise. You can make changes to your lifestyle to prevent obesity and stay as healthy as possible. What nutrition changes can be made? To maintain a healthy weight and prevent obesity:  Eat only as much as your body needs. To do this: ? Pay attention to signs that you are hungry or full. Stop eating as soon as you feel full. ? If you feel hungry, try drinking water first. Drink enough water so your urine is clear or pale yellow. ? Eat smaller portions. ? Look at serving sizes on food labels. Most foods contain more than one serving per container. ? Eat the recommended amount of calories for your gender and activity level. While  most active people should eat around 2,000 calories per day, if you are trying to lose weight or are not very active, you main need to eat less calories. Talk to your health care provider or dietitian about how many calories you should eat each day.  Choose healthy foods, such as: ? Fruits and vegetables. Try to fill at least half of your plate at each meal with fruits and vegetables. ? Whole grains, such as whole wheat bread, brown rice, and quinoa. ? Lean meats, such as chicken or fish. ? Other healthy proteins, such as beans, eggs, or tofu. ? Healthy fats, such as nuts, seeds, fatty fish, and olive oil. ? Low-fat or fat-free dairy.  Check food labels and avoid food and drinks that: ? Are high in calories. ? Have added sugar. ? Are high in sodium. ? Have saturated fats or trans fats.  Limit how much you eat of the following foods: ? Prepackaged meals. ? Fast food. ? Fried foods. ? Processed meat, such as bacon, sausage, and deli meats. ? Fatty cuts of red meat and poultry with skin.  Cook foods in healthier ways, such as by baking, broiling, or grilling.  When grocery shopping, try to shop around the outside of the store. This helps you buy mostly fresh foods and avoid canned and prepackaged foods.  What lifestyle changes can be made?  Exercise at least 30 minutes 5 or more days each week. Exercising includes brisk walking, yard work, biking, running, swimming, and team sports like basketball and soccer. Ask your health care provider which exercises are safe for you.  Do not use any products that contain nicotine or tobacco, such as cigarettes and e-cigarettes. If you need help quitting, ask your health care provider.  Limit alcohol intake to no more than 1 drink a day for nonpregnant women and 2 drinks a day for men. One drink equals 12 oz of beer, 5 oz of wine, or 1 oz of hard liquor.  Try to get 7-9 hours of sleep each night. What other changes can be made?  Keep a food  and activity journal to keep track of: ? What you ate and how many calories you had. Remember to count sauces, dressings, and side dishes. ? Whether you were active, and what exercises you did. ? Your calorie, weight, and activity goals.  Check your weight regularly. Track any changes. If you notice you have gained weight, make changes to your diet or activity routine.  Avoid taking weight-loss medicines or supplements. Talk to your health care provider before starting any new medicine or supplement.  Talk to your health care provider before trying any new diet or exercise plan. Why are these changes important? Eating healthy, staying active, and having healthy habits not only help prevent obesity, they also:  Help you to manage stress and emotions.  Help you to connect with friends and family.  Improve your self-esteem.  Improve your sleep.  Prevent long-term health problems.  What can happen if changes are not made? Being obese or overweight can cause you to develop joint or bone problems, which can make it hard for you to stay active or do activities you enjoy. Being obese or overweight also puts stress on your heart and lungs and can lead to health problems like diabetes, heart disease, and some cancers. Where to find more information: Talk with your health care provider or a dietitian about healthy eating and healthy lifestyle choices. You may also find other information through these resources:  U.S. Department of Agriculture MyPlate: FormerBoss.no  American Heart Association: www.heart.org  Centers for Disease Control and Prevention: http://www.wolf.info/  Summary  Staying at a healthy weight is important. It helps prevent certain diseases and health problems, such as heart disease, diabetes, joint problems, sleep disorders, and some cancers.  Being obese or overweight can cause you to develop joint or bone problems, which can make it hard for you to stay active or do  activities you enjoy.  You can prevent unhealthy weight gain by eating a healthy diet, exercising regularly, not smoking, limiting alcohol, and getting enough sleep.  Talk with your health care provider or a dietitian for guidance about healthy eating and healthy lifestyle choices. This information is not intended to replace advice given to you by your health care provider. Make sure you discuss any questions you have with your health care provider. Document Released: 04/10/2016 Document Revised: 05/16/2016 Document Reviewed: 05/16/2016 Elsevier Interactive Patient Education  2018 [employer].  Obesity, Adult Obesity is the condition of having too much total body fat. Being overweight or obese means that your weight is greater than what is considered healthy for your body size. Obesity is determined by a measurement called BMI. BMI is an estimate of body fat and is calculated from height and weight. For adults, a BMI of 30 or higher is considered obese. Obesity can eventually lead to other health concerns and major illnesses, including:  Stroke.  Coronary artery disease (CAD).  Type 2 diabetes.  Some types of cancer, including cancers of the colon, breast, uterus, and gallbladder.  Osteoarthritis.  High blood pressure (hypertension).  High cholesterol.  Sleep apnea.  Gallbladder stones.  Infertility problems.  What are the causes? The main cause of obesity is taking in (consuming) more calories than your body uses for energy. Other factors that contribute to this condition may include:  Being born with genes that make you more likely to become obese.  Having a medical condition that causes obesity. These conditions include: ? Hypothyroidism. ? Polycystic ovarian syndrome (PCOS). ? Binge-eating disorder. ? Cushing syndrome.  Taking certain medicines, such as steroids, antidepressants, and seizure medicines.  Not being physically active (sedentary lifestyle).  Living  where there are limited places to exercise safely or buy healthy foods.  Not getting enough sleep.  What increases the risk? The following factors may increase your risk of this condition:  Having a family history of obesity.  Being a woman of African-American descent.  Being a man of Hispanic descent.  What are the signs or symptoms? Having excessive body fat is the main symptom of this condition. How is this diagnosed? This condition may be diagnosed based on:  Your symptoms.  Your medical history.  A physical exam. Your health care provider may measure: ? Your BMI. If you are an adult with a BMI between 25 and less than 30, you are considered overweight. If you are an adult with a BMI of 30 or higher, you are considered obese. ? The distances around your hips and your waist (circumferences). These may be compared to each other to help diagnose your condition. ? Your skinfold thickness. Your health care provider may gently pinch a fold of your skin and measure it.  How is this treated? Treatment for this condition often includes changing your lifestyle. Treatment may include some or all of the following:  Dietary changes. Work with your health care provider and a dietitian to set a weight-loss goal that is healthy and reasonable for you. Dietary changes may include eating: ? Smaller portions. A portion size is the amount of a particular food that is healthy for you to eat at one time. This varies from person to person. ? Low-calorie or low-fat options. ? More whole grains, fruits, and vegetables.  Regular physical activity. This may include aerobic activity (cardio) and strength training.  Medicine to help you lose weight. Your health care provider may prescribe medicine if you are unable to lose 1 pound a week after 6 weeks of eating more healthily and doing more physical activity.  Surgery. Surgical options may include gastric banding and gastric bypass. Surgery may be done  if: ? Other treatments have not helped to improve your condition. ? You have a BMI of 40 or higher. ? You have life-threatening health problems related to obesity.  Follow these instructions at home:  Eating and drinking   Follow recommendations from your health care provider about what you eat and drink. Your health care provider may advise you to: ? Limit fast foods, sweets, and processed snack foods. ? Choose low-fat options, such as low-fat milk instead of whole milk. ? Eat 5 or more servings of fruits or vegetables every day. ? Eat at home more often. This gives you more control over what you eat. ? Choose healthy foods when you eat out. ? Learn what a healthy portion size is. ? Keep low-fat snacks on hand. ? Avoid sugary drinks, such as soda, fruit juice, iced tea sweetened with sugar, and flavored milk. ? Eat a healthy breakfast.  Drink enough water to keep  your urine clear or pale yellow.  Do not go without eating for long periods of time (do not fast) or follow a fad diet. Fasting and fad diets can be unhealthy and even dangerous. Physical Activity  Exercise regularly, as told by your health care provider. Ask your health care provider what types of exercise are safe for you and how often you should exercise.  Warm up and stretch before being active.  Cool down and stretch after being active.  Rest between periods of activity. Lifestyle  Limit the time that you spend in front of your TV, computer, or video game system.  Find ways to reward yourself that do not involve food.  Limit alcohol intake to no more than 1 drink a day for nonpregnant women and 2 drinks a day for men. One drink equals 12 oz of beer, 5 oz of wine, or 1 oz of hard liquor. General instructions  Keep a weight loss journal to keep track of the food you eat and how much you exercise you get.  Take over-the-counter and prescription medicines only as told by your health care provider.  Take  vitamins and supplements only as told by your health care provider.  Consider joining a support group. Your health care provider may be able to recommend a support group.  Keep all follow-up visits as told by your health care provider. This is important. Contact a health care provider if:  You are unable to meet your weight loss goal after 6 weeks of dietary and lifestyle changes. This information is not intended to replace advice given to you by your health care provider. Make sure you discuss any questions you have with your health care provider. Document Released: 05/17/2004 Document Revised: 09/12/2015 Document Reviewed: 01/26/2015 Elsevier Interactive Patient Education  2018 [employer].

## 2017-12-13 NOTE — Progress Notes (Signed)
BP 124/78   Pulse 98   Temp 98.4 F (36.9 C)   Ht 5\' 2"  (1.575 m)   Wt 214 lb 12.8 oz (97.4 kg)   SpO2 98%   BMI 39.29 kg/m    Subjective:    Patient ID: Wendy Kent, female    DOB: 1971/02/15, 47 y.o.   MRN: [id number]  HPI: Wendy Kent is a 47 y.o. female  Chief Complaint  Patient presents with  . Follow-up    HPI Patient is here for obesity She was on Saxenda earlier, but then started taking phentermine through another clinic She had titrated up to 2.4 and requested refills, so she was asked to come in to discuss She was trying to do too much; had the breast reduction; feels more comfortable now Was trying to do too much and trying to lose too much too fast and went to weight loss clinic; did the phentermine and then stopped that and after two weeks, started back on the Saxenda; got up to 2.4 and then ran out and stopped; has been off for at least a week Has the cravings and the saxenda helped those; if she tried to overcome, then would do something healthy Likes sweets Off the saxenda now for a week No growth in the thyroid No abdominal pain Just ran out; did not stop because of any symptoms 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, to the store, soemthing like that Will try to bring clothes to use gym at work; likes the treadmill, and uses elliptical Drinking enough water  She had the breast reduction; 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, she has been Tour manager; has tried [employer]; does take probiotics Thought about doing an enema Does not have gallbladder If she eats breakfast food, she'll have IBS type symptoms all day; loose stools and cramping Two scrambled eggs with spinach on wheat flatbread; that was okay today Protein shake in the morning; those are okay She tried to donate plasma, but they said she couldn't because her Hct was 34; no pica She is s/p hysterectomy, so she's not losing blood anywhere  She has a hx of multinodular goiter, but  she was checked out and they told her everything was fine Lab Results  Component Value Date   TSH 0.80 12/13/2017    Depression screen [facility] 2/9 12/13/2017 09/12/2017 06/14/2017 04/30/2017 03/06/2017  Decreased Interest 0 0 0 0 0  Down, Depressed, Hopeless 0 0 0 0 0  PHQ - 2 Score 0 0 0 0 0    Relevant past medical, surgical, family and social history reviewed Past Medical History:  Diagnosis Date  . Allergy   . Blood transfusion without reported diagnosis   . BP (high blood pressure) 12/29/2012  . Breast hypertrophy 11/10/2014  . GERD (gastroesophageal reflux disease)   . Thyroid disease    patient was cleared by specialist. History of Thyroid nodules.   Past Surgical History:  Procedure Laterality Date  . ABDOMINAL HYSTERECTOMY     patient still has ovaries  . BREAST REDUCTION SURGERY Bilateral 08/05/2017   Procedure: BREAST REDUCTION WITH LIPOSUCTION;  Surgeon: Cristine Polio, MD;  Location: [place];  Service: Plastics;  Laterality: Bilateral;  . CHOLECYSTECTOMY    . SHOULDER ARTHROSCOPY Right   . TUBAL LIGATION     History reviewed. No pertinent family history.   Social History   Tobacco Use  . Smoking status: Never Smoker  . Smokeless tobacco: Never Used  Substance  Use Topics  . Alcohol use: Yes    Alcohol/week: 0.0 standard drinks    Comment: socially  . Drug use: No    Interim medical history since last visit reviewed. Allergies and medications reviewed  Review of Systems Per HPI unless specifically indicated above     Objective:    BP 124/78   Pulse 98   Temp 98.4 F (36.9 C)   Ht 5\' 2"  (1.575 m)   Wt 214 lb 12.8 oz (97.4 kg)   SpO2 98%   BMI 39.29 kg/m   Wt Readings from Last 3 Encounters:  12/13/17 214 lb 12.8 oz (97.4 kg)  09/12/17 212 lb 9.6 oz (96.4 kg)  08/05/17 216 lb 12.8 oz (98.3 kg)    Physical Exam  Constitutional: She appears well-developed and well-nourished.  obese  HENT:  Mouth/Throat: Mucous membranes are  normal.  Eyes: EOM are normal. No scleral icterus.  Cardiovascular: Normal rate and regular rhythm.  Pulmonary/Chest: Effort normal and breath sounds normal.  Psychiatric: She has a normal mood and affect. Her mood appears not anxious. She does not exhibit a depressed mood.  Very pleasant, excellent historian      Assessment & Plan:   Problem List Items Addressed This Visit      Digestive   IBS (irritable bowel syndrome)    Symptoms have flared since surgery (reduction mammoplasty); continue probiotics; refer to GI        Endocrine   Multinodular goiter    Evaluated by specialist; turned loose per pt; will check TSH      Relevant Orders   TSH (Completed)     Other   Obesity (BMI 35.0-39.9 without comorbidity)    Talked with patient about weight loss options; encouraged nutritional counseling; exercise discussed; adequate hydration      Relevant Medications   Liraglutide -Weight Management (SAXENDA) 18 MG/3ML SOPN   Other Relevant Orders   Amb ref to Medical Nutrition Therapy-MNT   Iron deficiency anemia - Primary    Check labs today      Relevant Orders   CBC with Differential/Platelet (Completed)   Fe+TIBC+Fer (Completed)   Ambulatory referral to Gastroenterology    Other Visit Diagnoses    Abdominal cramping       Relevant Orders   Ambulatory referral to Gastroenterology   Gassiness       Relevant Orders   Ambulatory referral to Gastroenterology       Follow up plan: Return in about 6 weeks (around 01/24/2018) for CPE for  pap smear (or just after).  An after-visit summary was printed and given to the patient at [facility].  Please see the patient instructions which may contain other information and recommendations beyond what is mentioned above in the assessment and plan.  Meds ordered this encounter  Medications  . Liraglutide -Weight Management (SAXENDA) 18 MG/3ML SOPN    Sig: Inject 0.6 mg into the skin daily. x 1 week, then 1.2 mg daily x 1 week,  then 1.8 mg daily x 1 week, then 2.4 mg daily x 1 week, then 3 mg daily    Dispense:  3 mL    Refill:  0  . Insulin Pen Needle (B-D UF III MINI PEN NEEDLES) 31G X 5 MM MISC    Sig: For use with Saxenda, one injection subcutaneously once a day    Dispense:  30 each    Refill:  5    Orders Placed This Encounter  Procedures  . CBC with Differential/Platelet  .  Fe+TIBC+Fer  . TSH  . Ambulatory referral to Gastroenterology  . Amb ref to Medical Nutrition Therapy-MNT

## 2017-12-13 NOTE — Assessment & Plan Note (Signed)
Symptoms have flared since surgery (reduction mammoplasty); continue probiotics; refer to GI

## 2017-12-13 NOTE — Assessment & Plan Note (Signed)
Talked with patient about weight loss options; encouraged nutritional counseling; exercise discussed; adequate hydration

## 2017-12-13 NOTE — Assessment & Plan Note (Signed)
Check labs today.

## 2017-12-13 NOTE — Assessment & Plan Note (Signed)
Evaluated by specialist; turned loose per pt; will check TSH

## 2017-12-14 ENCOUNTER — Other Ambulatory Visit: Payer: Self-pay | Admitting: Family Medicine

## 2017-12-14 DIAGNOSIS — D500 Iron deficiency anemia secondary to blood loss (chronic): Secondary | ICD-10-CM

## 2017-12-14 LAB — CBC WITH DIFFERENTIAL/PLATELET
Basophils Absolute: 41 cells/uL (ref 0–200)
Basophils Relative: 0.5 %
Eosinophils Absolute: 97 cells/uL (ref 15–500)
Eosinophils Relative: 1.2 %
HCT: 34.1 % — ABNORMAL LOW (ref 35.0–45.0)
Hemoglobin: 11.2 g/dL — ABNORMAL LOW (ref 11.7–15.5)
Lymphs Abs: 1985 cells/uL (ref 850–3900)
MCH: 27.1 pg (ref 27.0–33.0)
MCHC: 32.8 g/dL (ref 32.0–36.0)
MCV: 82.4 fL (ref 80.0–100.0)
MPV: 10.1 fL (ref 7.5–12.5)
Monocytes Relative: 4 %
Neutro Abs: 5654 cells/uL (ref 1500–7800)
Neutrophils Relative %: 69.8 %
Platelets: 418 10*3/uL — ABNORMAL HIGH (ref 140–400)
RBC: 4.14 10*6/uL (ref 3.80–5.10)
RDW: 14.6 % (ref 11.0–15.0)
Total Lymphocyte: 24.5 %
WBC mixed population: 324 cells/uL (ref 200–950)
WBC: 8.1 10*3/uL (ref 3.8–10.8)

## 2017-12-14 LAB — IRON,TIBC AND FERRITIN PANEL
%SAT: 5 % (calc) — ABNORMAL LOW (ref 16–45)
Ferritin: 11 ng/mL — ABNORMAL LOW (ref 16–232)
Iron: 17 ug/dL — ABNORMAL LOW (ref 40–190)
TIBC: 341 mcg/dL (calc) (ref 250–450)

## 2017-12-14 LAB — TSH: TSH: 0.8 mIU/L

## 2017-12-14 NOTE — Progress Notes (Signed)
Start iron therapy Recheck labs in 2 months

## 2018-01-01 DIAGNOSIS — M546 Pain in thoracic spine: Secondary | ICD-10-CM | POA: Diagnosis not present

## 2018-01-01 DIAGNOSIS — M542 Cervicalgia: Secondary | ICD-10-CM | POA: Diagnosis not present

## 2018-01-01 DIAGNOSIS — N62 Hypertrophy of breast: Secondary | ICD-10-CM | POA: Diagnosis not present

## 2018-01-13 ENCOUNTER — Other Ambulatory Visit: Payer: Self-pay | Admitting: Family Medicine

## 2018-01-14 ENCOUNTER — Telehealth: Payer: Self-pay | Admitting: Family Medicine

## 2018-01-14 NOTE — Telephone Encounter (Signed)
Left detailed voicemail

## 2018-01-14 NOTE — Telephone Encounter (Signed)
Jamison Neighbor, MD         Your patient whom you referred for the Medical Nutrition Therapy Program has not come as you had requested.  Please discuss this with your patient.  When he/she decides to commit to the program, please let us know.   Called numerous times but patient did not call back.   Thank you for this referral, and if we can be of further assistance to you or your patient, let us know.     [place]  Administrative Clinical Assistant  Direct Dial: [phone]  Fax: [phone]  Website: [email or web address]     Please call patient We absolutely have to have her working with nutrition in conjunction with the weight loss medicine I'll ask her to work with them and keep appointments to be able to continue to refill this

## 2018-01-27 ENCOUNTER — Telehealth: Payer: Self-pay | Admitting: Gastroenterology

## 2018-01-27 ENCOUNTER — Ambulatory Visit: Payer: BLUE CROSS/BLUE SHIELD | Admitting: Gastroenterology

## 2018-01-27 NOTE — Telephone Encounter (Signed)
Patient called & l/m on answering machine to cancel her appointment for 11-27-17 & I called to r/s ans left her a message.

## 2018-01-27 NOTE — Progress Notes (Deleted)
She has been referred for anemia.  In addition has gas and abdominal cramping.  Recent iron studies on 12/10/2017 show a ferritin of 11 TIBC of 341.  Iron percentage saturation of 5.  Hemoglobin of 11.2 g with an MCV of 82.49 months back was 12.2 g with an MCV of 84.4.  For iron deficiency anemia, abdominal cramping and gas.  Rectal bleeding: *** Nose bleeds: *** Vaginal bleeding : *** Hematemesis or hemoptysis : *** Blood in urine : ***     None 1.  Check B12 folate, urine analysis for blood loss.  Celiac serology.  CMP. 2.  EGD plus colonoscopy and if negative will need capsule study of the small bowel. 3.  Continue oral iron.

## 2018-01-28 ENCOUNTER — Encounter (INDEPENDENT_AMBULATORY_CARE_PROVIDER_SITE_OTHER): Payer: Self-pay

## 2018-01-28 ENCOUNTER — Ambulatory Visit (INDEPENDENT_AMBULATORY_CARE_PROVIDER_SITE_OTHER): Payer: BLUE CROSS/BLUE SHIELD | Admitting: Family Medicine

## 2018-01-28 ENCOUNTER — Encounter: Payer: Self-pay | Admitting: Family Medicine

## 2018-01-28 VITALS — BP 122/74 | HR 94 | Temp 97.9°F | Ht 62.0 in | Wt 215.1 lb

## 2018-01-28 DIAGNOSIS — D500 Iron deficiency anemia secondary to blood loss (chronic): Secondary | ICD-10-CM | POA: Diagnosis not present

## 2018-01-28 DIAGNOSIS — Z1239 Encounter for other screening for malignant neoplasm of breast: Secondary | ICD-10-CM | POA: Diagnosis not present

## 2018-01-28 DIAGNOSIS — Z0000 Encounter for general adult medical examination without abnormal findings: Secondary | ICD-10-CM | POA: Diagnosis not present

## 2018-01-28 NOTE — Progress Notes (Signed)
Patient ID: Wendy Kent, female   DOB: [birth year], 47 y.o.   MRN: [id number]   Subjective:   Wendy Kent is a 47 y.o. female here for a complete physical exam  USPSTF grade A and B recommendations Depression:  Depression screen [facility] 2/9 01/31/2018 12/13/2017 09/12/2017 06/14/2017 04/30/2017  Decreased Interest 0 0 0 0 0  Down, Depressed, Hopeless 0 0 0 0 0  PHQ - 2 Score 0 0 0 0 0   Hypertension: BP Readings from Last 3 Encounters:  01/28/18 122/74  12/13/17 124/78  09/12/17 112/82   Obesity: Wt Readings from Last 3 Encounters:  01/31/18 213 lb 3.2 oz (96.7 kg)  01/28/18 215 lb 1.6 oz (97.6 kg)  12/13/17 214 lb 12.8 oz (97.4 kg)   BMI Readings from Last 3 Encounters:  01/31/18 38.99 kg/m  01/28/18 39.34 kg/m  12/13/17 39.29 kg/m    Skin cancer: no worrisome Lung cancer:  Non smoker Breast cancer: no lumps or bumps; mammo ordered Colorectal cancer: already  Has appt coming up with GI; explained start at age 77; no blood in the stool Cervical cancer screening: reviewed the op visit note from [place], entire cervix removed;   BRCA gene screening: family hx of breast and/or ovarian cancer and/or metastatic prostate cancer? None known HIV, hep B, hep C: not interested STD testing and prevention (chl/gon/syphilis): not interested Intimate partner violence: no abuse Contraception: n/a Osteoporosis: no steroids, nonsmoker, still has ovaries Fall prevention/vitamin D: discussed Immunizations: tetanus UTD: not interested in flu shot  Diet: doing well with veggies; harder to get fruits in; grapes, strawberries, apples, bananas Exercise: getting activity and working the right shoulder/arm; getting that worked with the left too; starting work out every night Alcohol:    Office Visit from 12/13/2017 in [facility]  AUDIT-C Score  1    Tobacco use: nonsmoker AAA: n/a Aspirin: n/a Glucose:  Glucose, Bld  Date Value Ref Range Status  07/30/2017 90 65  - 99 mg/dL Final  03/06/2017 77 65 - 99 mg/dL Final    Comment:    .            Fasting reference interval .   12/22/2015 94 65 - 99 mg/dL Final   Lipids:  Lab Results  Component Value Date   CHOL 130 03/06/2017   CHOL 120 11/10/2014   Lab Results  Component Value Date   HDL 62 03/06/2017   HDL 68 11/10/2014   Lab Results  Component Value Date   LDLCALC 54 03/06/2017   LDLCALC 38 11/10/2014   Lab Results  Component Value Date   TRIG 65 03/06/2017   TRIG 71 11/10/2014   Lab Results  Component Value Date   CHOLHDL 2.1 03/06/2017   CHOLHDL 1.8 11/10/2014   No results found for: LDLDIRECT   Past Medical History:  Diagnosis Date  . Allergy   . Blood transfusion without reported diagnosis   . BP (high blood pressure) 12/29/2012  . Breast hypertrophy 11/10/2014  . GERD (gastroesophageal reflux disease)   . Thyroid disease    patient was cleared by specialist. History of Thyroid nodules.   Past Surgical History:  Procedure Laterality Date  . ABDOMINAL HYSTERECTOMY     patient still has ovaries  . BREAST REDUCTION SURGERY Bilateral 08/05/2017   Procedure: BREAST REDUCTION WITH LIPOSUCTION;  Surgeon: Cristine Polio, MD;  Location: [place];  Service: Plastics;  Laterality: Bilateral;  . CHOLECYSTECTOMY    . SHOULDER ARTHROSCOPY Right   .  TUBAL LIGATION     History reviewed. No pertinent family history. Social History   Tobacco Use  . Smoking status: Never Smoker  . Smokeless tobacco: Never Used  Substance Use Topics  . Alcohol use: Yes    Alcohol/week: 0.0 standard drinks    Comment: socially  . Drug use: No   Review of Systems  Constitutional: Negative for unexpected weight change.  HENT: Negative for hearing loss.   Eyes: Positive for visual disturbance (looks at computer all day; changed her glasses).       Seeing eye doctor  Respiratory: Negative for wheezing.   Cardiovascular: Negative for chest pain.  Gastrointestinal: Negative  for blood in stool.  Endocrine: Negative for polydipsia.  Genitourinary: Negative for hematuria.  Skin:       No worrisome moles  Allergic/Immunologic: Negative for food allergies.  Neurological: Negative for tremors.  Hematological: Negative for adenopathy.    Objective:   Vitals:   01/28/18 1529  BP: 122/74  Pulse: 94  Temp: 97.9 F (36.6 C)  TempSrc: Oral  SpO2: 94%  Weight: 215 lb 1.6 oz (97.6 kg)  Height: 5' 2"  (1.575 m)   Body mass index is 39.34 kg/m. Wt Readings from Last 3 Encounters:  01/31/18 213 lb 3.2 oz (96.7 kg)  01/28/18 215 lb 1.6 oz (97.6 kg)  12/13/17 214 lb 12.8 oz (97.4 kg)   Physical Exam  Constitutional: She appears well-developed and well-nourished.  HENT:  Head: Normocephalic and atraumatic.  Right Ear: Hearing, tympanic membrane, external ear and ear canal normal.  Left Ear: Hearing, tympanic membrane, external ear and ear canal normal.  Eyes: Conjunctivae and EOM are normal. Right eye exhibits no hordeolum. Left eye exhibits no hordeolum. No scleral icterus.  Neck: Carotid bruit is not present. No thyromegaly present.  Cardiovascular: Normal rate, regular rhythm, S1 normal, S2 normal and normal heart sounds.  No extrasystoles are present.  Pulmonary/Chest: Effort normal and breath sounds normal. No respiratory distress. Right breast exhibits no inverted nipple, no mass, no nipple discharge, no skin change and no tenderness. Left breast exhibits no inverted nipple, no mass, no nipple discharge, no skin change and no tenderness. Breasts are symmetrical.  Abdominal: Soft. Normal appearance and bowel sounds are normal. She exhibits no distension, no abdominal bruit, no pulsatile midline mass and no mass. There is no hepatosplenomegaly. There is no tenderness. No hernia.  Musculoskeletal: Normal range of motion. She exhibits no edema.  Lymphadenopathy:       Head (right side): No submandibular adenopathy present.       Head (left side): No  submandibular adenopathy present.    She has no cervical adenopathy.    She has no axillary adenopathy.  Neurological: She is alert. She displays no tremor. No cranial nerve deficit. She exhibits normal muscle tone. Gait normal.  Reflex Scores:      Patellar reflexes are 2+ on the right side and 2+ on the left side. Skin: Skin is warm and dry. No bruising and no ecchymosis noted. No cyanosis. No pallor.  Psychiatric: Her speech is normal and behavior is normal. Thought content normal. Her mood appears not anxious. She does not exhibit a depressed mood.    Assessment/Plan:   Problem List Items Addressed This Visit      Other   Iron deficiency anemia   Preventative health care - Primary    USPSTF grade A and B recommendations reviewed with patient; age-appropriate recommendations, preventive care, screening tests, etc discussed and encouraged; healthy  living encouraged; see AVS for patient education given to patient       Relevant Orders   COMPLETE METABOLIC PANEL WITH GFR   Lipid panel   TSH    Other Visit Diagnoses    Screening for breast cancer       Relevant Orders   MM 3D SCREEN BREAST BILATERAL       No orders of the defined types were placed in this encounter.  Orders Placed This Encounter  Procedures  . MM 3D SCREEN BREAST BILATERAL    Standing Status:   Future    Standing Expiration Date:   03/31/2019    Order Specific Question:   Reason for Exam (SYMPTOM  OR DIAGNOSIS REQUIRED)    Answer:   screen for breast cancer    Order Specific Question:   Is the patient pregnant?    Answer:   No    Order Specific Question:   Preferred imaging location?    Answer:   [facility] PANEL WITH GFR  . Lipid panel  . TSH    Follow up plan: Return in about 1 year (around 01/29/2019) for complete physical.  An After Visit Summary was printed and given to the patient.

## 2018-01-28 NOTE — Patient Instructions (Addendum)
I do recommend yearly flu shots; for individuals who don't want flu shots, try to practice excellent hand hygiene, and avoid nursing homes, day cares, and hospitals during peak flu season; taking additional vitamin C daily during flu/cold season may help boost your immune system too  Return for labs in a few weeks  Health Maintenance, Female Adopting a healthy lifestyle and getting preventive care can go a long way to promote health and wellness. Talk with your health care provider about what schedule of regular examinations is right for you. This is a good chance for you to check in with your provider about disease prevention and staying healthy. In between checkups, there are plenty of things you can do on your own. Experts have done a lot of research about which lifestyle changes and preventive measures are most likely to keep you healthy. Ask your health care provider for more information. Weight and diet Eat a healthy diet  Be sure to include plenty of vegetables, fruits, low-fat dairy products, and lean protein.  Do not eat a lot of foods high in solid fats, added sugars, or salt.  Get regular exercise. This is one of the most important things you can do for your health. ? Most adults should exercise for at least 150 minutes each week. The exercise should increase your heart rate and make you sweat (moderate-intensity exercise). ? Most adults should also do strengthening exercises at least twice a week. This is in addition to the moderate-intensity exercise.  Maintain a healthy weight  Body mass index (BMI) is a measurement that can be used to identify possible weight problems. It estimates body fat based on height and weight. Your health care provider can help determine your BMI and help you achieve or maintain a healthy weight.  For females 47 years of age and older: ? A BMI below 18.5 is considered underweight. ? A BMI of 18.5 to 24.9 is normal. ? A BMI of 25 to 29.9 is considered  overweight. ? A BMI of 30 and above is considered obese.  Watch levels of cholesterol and blood lipids  You should start having your blood tested for lipids and cholesterol at 47 years of age, then have this test every 5 years.  You may need to have your cholesterol levels checked more often if: ? Your lipid or cholesterol levels are high. ? You are older than 47 years of age. ? You are at high risk for heart disease.  Cancer screening Lung Cancer  Lung cancer screening is recommended for adults 61-71 years old who are at high risk for lung cancer because of a history of smoking.  A yearly low-dose CT scan of the lungs is recommended for people who: ? Currently smoke. ? Have quit within the past 15 years. ? Have at least a 30-pack-year history of smoking. A pack year is smoking an average of one pack of cigarettes a day for 1 year.  Yearly screening should continue until it has been 15 years since you quit.  Yearly screening should stop if you develop a health problem that would prevent you from having lung cancer treatment.  Breast Cancer  Practice breast self-awareness. This means understanding how your breasts normally appear and feel.  It also means doing regular breast self-exams. Let your health care provider know about any changes, no matter how small.  If you are in your 47s or 30s, you should have a clinical breast exam (CBE) by a health care provider every 1-3  years as part of a regular health exam.  If you are 47 or older, have a CBE every year. Also consider having a breast X-ray (mammogram) every year.  If you have a family history of breast cancer, talk to your health care provider about genetic screening.  If you are at high risk for breast cancer, talk to your health care provider about having an MRI and a mammogram every year.  Breast cancer gene (BRCA) assessment is recommended for women who have family members with BRCA-related cancers. BRCA-related cancers  include: ? Breast. ? Ovarian. ? Tubal. ? Peritoneal cancers.  Results of the assessment will determine the need for genetic counseling and BRCA1 and BRCA2 testing.  Cervical Cancer Your health care provider may recommend that you be screened regularly for cancer of the pelvic organs (ovaries, uterus, and vagina). This screening involves a pelvic examination, including checking for microscopic changes to the surface of your cervix (Pap test). You may be encouraged to have this screening done every 3 years, beginning at age 47.  For women ages 42-65, health care providers may recommend pelvic exams and Pap testing every 3 years, or they may recommend the Pap and pelvic exam, combined with testing for human papilloma virus (HPV), every 5 years. Some types of HPV increase your risk of cervical cancer. Testing for HPV may also be done on women of any age with unclear Pap test results.  Other health care providers may not recommend any screening for nonpregnant women who are considered low risk for pelvic cancer and who do not have symptoms. Ask your health care provider if a screening pelvic exam is right for you.  If you have had past treatment for cervical cancer or a condition that could lead to cancer, you need Pap tests and screening for cancer for at least 20 years after your treatment. If Pap tests have been discontinued, your risk factors (such as having a new sexual partner) need to be reassessed to determine if screening should resume. Some women have medical problems that increase the chance of getting cervical cancer. In these cases, your health care provider may recommend more frequent screening and Pap tests.  Colorectal Cancer  This type of cancer can be detected and often prevented.  Routine colorectal cancer screening usually begins at 47 years of age and continues through 47 years of age.  Your health care provider may recommend screening at an earlier age if you have risk factors  for colon cancer.  Your health care provider may also recommend using home test kits to check for hidden blood in the stool.  A small camera at the end of a tube can be used to examine your colon directly (sigmoidoscopy or colonoscopy). This is done to check for the earliest forms of colorectal cancer.  Routine screening usually begins at age 81.  Direct examination of the colon should be repeated every 5-10 years through 47 years of age. However, you may need to be screened more often if early forms of precancerous polyps or small growths are found.  Skin Cancer  Check your skin from head to toe regularly.  Tell your health care provider about any new moles or changes in moles, especially if there is a change in a mole's shape or color.  Also tell your health care provider if you have a mole that is larger than the size of a pencil eraser.  Always use sunscreen. Apply sunscreen liberally and repeatedly throughout the day.  Protect yourself  by wearing long sleeves, pants, a wide-brimmed hat, and sunglasses whenever you are outside.  Heart disease, diabetes, and high blood pressure  High blood pressure causes heart disease and increases the risk of stroke. High blood pressure is more likely to develop in: ? People who have blood pressure in the high end of the normal range (130-139/85-89 mm Hg). ? People who are overweight or obese. ? People who are African American.  If you are 71-54 years of age, have your blood pressure checked every 3-5 years. If you are 60 years of age or older, have your blood pressure checked every year. You should have your blood pressure measured twice--once when you are at a hospital or clinic, and once when you are not at a hospital or clinic. Record the average of the two measurements. To check your blood pressure when you are not at a hospital or clinic, you can use: ? An automated blood pressure machine at a pharmacy. ? A home blood pressure monitor.  If  you are between 57 years and 69 years old, ask your health care provider if you should take aspirin to prevent strokes.  Have regular diabetes screenings. This involves taking a blood sample to check your fasting blood sugar level. ? If you are at a normal weight and have a low risk for diabetes, have this test once every three years after 47 years of age. ? If you are overweight and have a high risk for diabetes, consider being tested at a younger age or more often. Preventing infection Hepatitis B  If you have a higher risk for hepatitis B, you should be screened for this virus. You are considered at high risk for hepatitis B if: ? You were born in a country where hepatitis B is common. Ask your health care provider which countries are considered high risk. ? Your parents were born in a high-risk country, and you have not been immunized against hepatitis B (hepatitis B vaccine). ? You have HIV or AIDS. ? You use needles to inject street drugs. ? You live with someone who has hepatitis B. ? You have had sex with someone who has hepatitis B. ? You get hemodialysis treatment. ? You take certain medicines for conditions, including cancer, organ transplantation, and autoimmune conditions.  Hepatitis C  Blood testing is recommended for: ? Everyone born from [birth year] through 1965. ? Anyone with known risk factors for hepatitis C.  Sexually transmitted infections (STIs)  You should be screened for sexually transmitted infections (STIs) including gonorrhea and chlamydia if: ? You are sexually active and are younger than 47 years of age. ? You are older than 47 years of age and your health care provider tells you that you are at risk for this type of infection. ? Your sexual activity has changed since you were last screened and you are at an increased risk for chlamydia or gonorrhea. Ask your health care provider if you are at risk.  If you do not have HIV, but are at risk, it may be recommended  that you take a prescription medicine daily to prevent HIV infection. This is called pre-exposure prophylaxis (PrEP). You are considered at risk if: ? You are sexually active and do not regularly use condoms or know the HIV status of your partner(s). ? You take drugs by injection. ? You are sexually active with a partner who has HIV.  Talk with your health care provider about whether you are at high risk of being infected  with HIV. If you choose to begin PrEP, you should first be tested for HIV. You should then be tested every 3 months for as long as you are taking PrEP. Pregnancy  If you are premenopausal and you may become pregnant, ask your health care provider about preconception counseling.  If you may become pregnant, take 400 to 800 micrograms (mcg) of folic acid every day.  If you want to prevent pregnancy, talk to your health care provider about birth control (contraception). Osteoporosis and menopause  Osteoporosis is a disease in which the bones lose minerals and strength with aging. This can result in serious bone fractures. Your risk for osteoporosis can be identified using a bone density scan.  If you are 65 years of age or older, or if you are at risk for osteoporosis and fractures, ask your health care provider if you should be screened.  Ask your health care provider whether you should take a calcium or vitamin D supplement to lower your risk for osteoporosis.  Menopause may have certain physical symptoms and risks.  Hormone replacement therapy may reduce some of these symptoms and risks. Talk to your health care provider about whether hormone replacement therapy is right for you. Follow these instructions at home:  Schedule regular health, dental, and eye exams.  Stay current with your immunizations.  Do not use any tobacco products including cigarettes, chewing tobacco, or electronic cigarettes.  If you are pregnant, do not drink alcohol.  If you are  breastfeeding, limit how much and how often you drink alcohol.  Limit alcohol intake to no more than 1 drink per day for nonpregnant women. One drink equals 12 ounces of beer, 5 ounces of wine, or 1 ounces of hard liquor.  Do not use street drugs.  Do not share needles.  Ask your health care provider for help if you need support or information about quitting drugs.  Tell your health care provider if you often feel depressed.  Tell your health care provider if you have ever been abused or do not feel safe at home. This information is not intended to replace advice given to you by your health care provider. Make sure you discuss any questions you have with your health care provider. Document Released: 10/23/2010 Document Revised: 09/15/2015 Document Reviewed: 01/11/2015 Elsevier Interactive Patient Education  2018 [employer] Inc[employer]  

## 2018-01-31 ENCOUNTER — Encounter: Payer: Self-pay | Admitting: Dietician

## 2018-01-31 ENCOUNTER — Encounter: Payer: BLUE CROSS/BLUE SHIELD | Attending: Family Medicine | Admitting: Dietician

## 2018-01-31 VITALS — Ht 62.0 in | Wt 213.2 lb

## 2018-01-31 DIAGNOSIS — Z6838 Body mass index (BMI) 38.0-38.9, adult: Secondary | ICD-10-CM | POA: Diagnosis not present

## 2018-01-31 DIAGNOSIS — E6609 Other obesity due to excess calories: Secondary | ICD-10-CM

## 2018-01-31 NOTE — Progress Notes (Signed)
Medical Nutrition Therapy: Visit start time: 1600  end time: 1700  Assessment:  Diagnosis: Obesity  Past medical history: HTN, IBS, Iron deficiency anemia, Lactose intolerance Psychosocial issues/ stress concerns: finances, children  Preferred learning method:  Nicki [place] . Hands-on  Current weight: 213.2#  Height: 5\' 2"  Medications, supplements: HCTZ, Saxenda, Flonase, see chart  Progress and evaluation: Pt is struggling with wt loss which she feels in part is due to chronic fatigue and sedentary day-job. She is also a single mom and does not feel that she has much free time in her day-to-day life. She is often inconsistent with meal and fluid intake and does not like to cook, resulting in frequent eating out at fast food locations and/or restaurants. She craves sweets after meals and tends to gravitate towards starch-heavy meals when given the choice. She sometimes fasts during the day d/t feeling that she does not have enough money for food some weeks. Recently she has started to re-incorporate some red meats into her diet d/t anemia but has been trying to choose leaner cuts. She has also been trying to drink more water, as sometimes she feels she mistakes thirst for hunger.   Physical activity: Weight training 4 days/week for 30 min at home. Has a gym at work but hasn't utilized it. Considering joining the [facility] with her sons  Dietary Intake:  Usual eating pattern includes 2-3 meals and 1-2 snacks per day. Dining out frequency: 9 meals per week.  Breakfast: If at work: fruit and/or 2 eggs + spinach on a whole wheat flat bread; other days: [employer] bagel with cream cheese sometimes, oatmeal, [employer] tuna sandwich, tuna packet + crackers or chips Snack: Lemonheads, chocolate or other small candy Lunch: may skip, tuna packet + crackers, sometimes fruit, smoothie from [employer] occasionally Snack: varies esp if she didn't have lunch Supper: frozen pizza, hot pocket, sandwich on  whole wheat bread, veggies, frozen meal occasionally Snack: chips Beverages: water, Green tea iced or hot  Nutrition Care Education: Topics covered: dietary sources of iron, saving money while eating healthy, sugar sweetened beverages Basic nutrition: basic food groups, appropriate nutrient balance, appropriate meal and snack schedule, general nutrition guidelines    Weight control: benefits of weight control, behavioral changes for weight loss Advanced nutrition: cooking techniques, dining out, food label reading Other lifestyle changes: benefits of making changes, increasing motivation, readiness for change, identifying habits that need to change  Nutritional Diagnosis:  [place]-3.3 Overweight/obesity As related to lifestyle habits.  As evidenced by BMI 38.99.  Intervention: Discussion as noted above. She will work to include dietary sources of iron d/t h/o iron deficiency anemia + chronic fatigue. She will try to be more consistent with meal intake and include more food groups when planning a meal for greater satiety and nutritional value. She will also try to make more of an effort to cook at home, at least one more night per week to save on food costs and have more nutrient dense options available  Education Materials given:  Marland Kitchen Iron Deficiency Anemia nutrition therapy . Quick and [employer] handout . Goals/ instructions  Learner/ who was taught:  . Patient   Level of understanding: Marland Kitchen Verbalizes/ demonstrates competency  Demonstrated degree of understanding via:   Teach back Learning barriers: . None  Willingness to learn/ readiness for change: . Acceptance, ready for change  Monitoring and Evaluation:  Dietary intake, exercise, and body weight      follow up: prn, pt plans  to call back once she knows her work schedule

## 2018-01-31 NOTE — Patient Instructions (Signed)
   Pair at least 2 food groups at a meal or snack to help stabilize blood sugars and reduce cravings. Sources of lean protein (chicken, lean beef or pork, fish, shrimp, eggs, beans, yogurt), fiber (fruits, vegetables, whole grains, skins of potatoes) and healthy fats (eggs, avocado, nuts, salmon, tuna) are all helpful to help reduce cravings as well  Try to eat on a regular schedule and not skip meals if you are able. This will keep appetite and cravings in check  Consider options for dinner like frozen vegetables or thin crust pizza (Digorno flat bread margharita pizza) + a salad  Cook at home one more day per week. This will help you save money and control what you eat

## 2018-02-03 ENCOUNTER — Encounter: Payer: Self-pay | Admitting: Dietician

## 2018-02-03 DIAGNOSIS — Z0000 Encounter for general adult medical examination without abnormal findings: Secondary | ICD-10-CM | POA: Insufficient documentation

## 2018-02-03 NOTE — Assessment & Plan Note (Signed)
USPSTF grade A and B recommendations reviewed with patient; age-appropriate recommendations, preventive care, screening tests, etc discussed and encouraged; healthy living encouraged; see AVS for patient education given to patient  

## 2018-02-11 ENCOUNTER — Other Ambulatory Visit: Payer: Self-pay | Admitting: Family Medicine

## 2018-03-17 ENCOUNTER — Ambulatory Visit (INDEPENDENT_AMBULATORY_CARE_PROVIDER_SITE_OTHER): Payer: BLUE CROSS/BLUE SHIELD | Admitting: Gastroenterology

## 2018-03-17 ENCOUNTER — Other Ambulatory Visit: Payer: Self-pay

## 2018-03-17 ENCOUNTER — Encounter: Payer: Self-pay | Admitting: Gastroenterology

## 2018-03-17 ENCOUNTER — Other Ambulatory Visit: Payer: Self-pay | Admitting: Gastroenterology

## 2018-03-17 VITALS — BP 110/77 | HR 93 | Ht 62.0 in | Wt 219.6 lb

## 2018-03-17 DIAGNOSIS — D509 Iron deficiency anemia, unspecified: Secondary | ICD-10-CM | POA: Diagnosis not present

## 2018-03-17 DIAGNOSIS — Z791 Long term (current) use of non-steroidal anti-inflammatories (NSAID): Secondary | ICD-10-CM

## 2018-03-17 DIAGNOSIS — R197 Diarrhea, unspecified: Secondary | ICD-10-CM

## 2018-03-17 MED ORDER — FERROUS SULFATE 325 (65 FE) MG PO TABS: 325.0000 mg | ORAL_TABLET | Freq: Two times a day (BID) | ORAL | 1 refills | Status: DC

## 2018-03-17 MED ORDER — CHOLESTYRAMINE 4 G PO PACK: 4.0000 g | PACK | Freq: Two times a day (BID) | ORAL | 12 refills | Status: DC

## 2018-03-17 NOTE — Progress Notes (Signed)
Jonathon Bellows MD, MRCP(U.K) [place]  [place], [place] [address]  Main: [phone]  Fax: [phone]   Gastroenterology Consultation  Referring Provider:     Arnetha Courser, MD Primary Care Physician:  Arnetha Courser, MD Primary Gastroenterologist:  Dr. Jonathon Bellows  Reason for Consultation:   Iron deficiency anemia , gas        HPI:   Wendy Kent is a 47 y.o. y/o female referred for consultation & management  by Dr. Sanda Klein, Satira Anis, MD.    She has been referred for iron deficiency anemia, abdominal cramping and gas.  She says that she has had blood transfusions in the past for anemia. Unknown reason.   Rectal bleeding: no  Nose bleeds: no  Vaginal bleeding : no : used to have heavy periods, hysterectomy 2014  Hematemesis or hemoptysis : sometimes smells blood  Blood in urine : none  Color of her stool is brown. Ibuprofen : not daily but 3 times a week on it for many years till 2014, then stopped and restarted this year: mid 2018 and been on since on and off, mostly on .   She has some cramping , lower abdominal , before a bowel movement , mostly better after a bowel movement. Occasional diarrhea , cramping is worse during episodes of diarrhea. Ongoing since since gall bladder taken out .  No artificial sugars, did drink cranberry ginger ale- regular : consumes 2 glasses a day . No chewing gum regularly.   Lot of fruit and vegetables. Daily bowel movement : oatmeal to looser. 2 bowel movements a day .   No gall bladder, s/p hystrectomy .   11/2017 : Hb 11.2 ,MCV 82 and elevated platelet count.Ferritin 10 . Hb  was normal 10 months earlier.      Past Medical History:  Diagnosis Date  . Allergy   . Blood transfusion without reported diagnosis   . BP (high blood pressure) 12/29/2012  . Breast hypertrophy 11/10/2014  . GERD (gastroesophageal reflux disease)   . Thyroid disease    patient was cleared by specialist. History of Thyroid nodules.     Past Surgical History:  Procedure Laterality Date  . ABDOMINAL HYSTERECTOMY     patient still has ovaries  . BREAST REDUCTION SURGERY Bilateral 08/05/2017   Procedure: BREAST REDUCTION WITH LIPOSUCTION;  Surgeon: Cristine Polio, MD;  Location: [facility];  Service: Plastics;  Laterality: Bilateral;  . CHOLECYSTECTOMY    . SHOULDER ARTHROSCOPY Right   . TUBAL LIGATION      Prior to Admission medications   Medication Sig Start Date End Date Taking? Authorizing Provider  fluticasone (FLONASE) 50 MCG/ACT nasal spray Place 2 sprays into both nostrils daily as needed. 09/12/17   Arnetha Courser, MD  hydrochlorothiazide (HYDRODIURIL) 25 MG tablet Take 1 tablet (25 mg total) by mouth daily. 10/23/17   Arnetha Courser, MD  Insulin Pen Needle (B-D UF III MINI PEN NEEDLES) 31G X 5 MM MISC For use with Saxenda, one injection subcutaneously once a day Patient not taking: Reported on 01/31/2018 12/13/17   Arnetha Courser, MD  SAXENDA 18 MG/3ML SOPN INJECT 3 MG INTO THE SKIN DAILY. 02/11/18   Arnetha Courser, MD    No family history on file.   Social History   Tobacco Use  . Smoking status: Never Smoker  . Smokeless tobacco: Never Used  Substance Use Topics  . Alcohol use: Yes    Alcohol/week: 0.0  standard drinks    Comment: socially  . Drug use: No    Allergies as of 03/17/2018 - Review Complete 02/03/2018  Allergen Reaction Noted  . Percocet [oxycodone-acetaminophen] Anaphylaxis 09/28/2014    Review of Systems:    All systems reviewed and negative except where noted in HPI.   Physical Exam:  There were no vitals taken for this visit. No LMP recorded. Patient has had a hysterectomy. Psych:  Alert and cooperative. Normal mood and affect. General:   Alert,  Well-developed, well-nourished, pleasant and cooperative in NAD Head:  Normocephalic and atraumatic. Eyes:  Sclera clear, no icterus.   Conjunctiva pink. Ears:  Normal auditory acuity. Nose:  No deformity,  discharge, or lesions. Mouth:  No deformity or lesions,oropharynx pink & moist. Neck:  Supple; no masses or thyromegaly. Lungs:  Respirations even and unlabored.  Clear throughout to auscultation.   No wheezes, crackles, or rhonchi. No acute distress. Heart:  Regular rate and rhythm; no murmurs, clicks, rubs, or gallops. Abdomen:  Normal bowel sounds.  No bruits.  Soft, non-tender and non-distended without masses, hepatosplenomegaly or hernias noted.  No guarding or rebound tenderness.    Neurologic:  Alert and oriented x3;  grossly normal neurologically. Skin:  Intact without significant lesions or rashes. No jaundice. Lymph Nodes:  No significant cervical adenopathy. Psych:  Alert and cooperative. Normal mood and affect.  Imaging Studies: No results found.  Assessment and Plan:   Wendy Kent is a 47 y.o. y/o female has been referred for iron deficiency anemia, gas. Long term NSAID use. Irin deficiency likely secondary to chronic NSAID use. Diarrhea could be either IBS-D or bile salt diarrhea since she has had a cholecystectomy   Plan   1. EGD+colonoscopy +/- capsule study of the small bowel  2. Check celiac serology,b12,folate,urine analysis. 3. Avoid all artificial sugars, high fructose corn syrup and sodas which cause gas, trial of LOW FODMAP diet , Questran- if no better will treat with Xifaxan  4. Stop all NSAID's 5. Check stool for C diff, PCR and calprotectin .  I have discussed alternative options, risks & benefits,  which include, but are not limited to, bleeding, infection, perforation,respiratory complication & drug reaction.  The patient agrees with this plan & written consent will be obtained.   Follow up in 6 weeks   Dr Jonathon Bellows MD,MRCP(U.K)

## 2018-03-18 ENCOUNTER — Encounter: Payer: Self-pay | Admitting: Gastroenterology

## 2018-03-18 LAB — CELIAC DISEASE AB SCREEN W/RFX
Antigliadin Abs, IgA: 3 units (ref 0–19)
IgA/Immunoglobulin A, Serum: 256 mg/dL (ref 87–352)
Transglutaminase IgA: <2 U/mL (ref 0–3)

## 2018-03-18 LAB — URINALYSIS
Bilirubin, UA: NEGATIVE
Glucose, UA: NEGATIVE
Ketones, UA: NEGATIVE
Leukocytes, UA: NEGATIVE
Nitrite, UA: NEGATIVE
Protein, UA: NEGATIVE
RBC, UA: NEGATIVE
Specific Gravity, UA: 1.018 (ref 1.005–1.030)
Urobilinogen, Ur: 1 mg/dL (ref 0.2–1.0)
pH, UA: 6.5 (ref 5.0–7.5)

## 2018-03-18 LAB — B12 AND FOLATE PANEL
Folate: 10 ng/mL (ref >3.0)
Vitamin B-12: >2000 pg/mL — ABNORMAL HIGH (ref 232–1245)

## 2018-03-19 LAB — CALPROTECTIN, FECAL: Calprotectin, Fecal: 72 ug/g (ref 0–120)

## 2018-03-19 LAB — CLOSTRIDIUM DIFFICILE BY PCR: Toxigenic C. Difficile by PCR: NEGATIVE

## 2018-03-21 LAB — GI PROFILE, STOOL, PCR

## 2018-03-24 ENCOUNTER — Encounter: Payer: Self-pay | Admitting: Gastroenterology

## 2018-03-26 ENCOUNTER — Ambulatory Visit: Payer: BLUE CROSS/BLUE SHIELD | Admitting: Anesthesiology

## 2018-03-26 ENCOUNTER — Other Ambulatory Visit: Payer: Self-pay

## 2018-03-26 ENCOUNTER — Ambulatory Visit
Admission: RE | Admit: 2018-03-26 | Discharge: 2018-03-26 | Disposition: A | Discharge status: Home / Self Care | Payer: BLUE CROSS/BLUE SHIELD | Source: Ambulatory Visit | Attending: Gastroenterology | Admitting: Gastroenterology

## 2018-03-26 ENCOUNTER — Encounter
Admission: RE | Disposition: A | Discharge status: Home / Self Care | Payer: Self-pay | Source: Ambulatory Visit | Attending: Gastroenterology

## 2018-03-26 ENCOUNTER — Encounter: Payer: Self-pay | Admitting: *Deleted

## 2018-03-26 DIAGNOSIS — Z79899 Other long term (current) drug therapy: Secondary | ICD-10-CM | POA: Insufficient documentation

## 2018-03-26 DIAGNOSIS — K5750 Diverticulosis of both small and large intestine without perforation or abscess without bleeding: Secondary | ICD-10-CM | POA: Diagnosis not present

## 2018-03-26 DIAGNOSIS — D122 Benign neoplasm of ascending colon: Secondary | ICD-10-CM | POA: Diagnosis not present

## 2018-03-26 DIAGNOSIS — R197 Diarrhea, unspecified: Secondary | ICD-10-CM

## 2018-03-26 DIAGNOSIS — D509 Iron deficiency anemia, unspecified: Secondary | ICD-10-CM | POA: Diagnosis present

## 2018-03-26 DIAGNOSIS — I10 Essential (primary) hypertension: Secondary | ICD-10-CM | POA: Diagnosis not present

## 2018-03-26 DIAGNOSIS — K219 Gastro-esophageal reflux disease without esophagitis: Secondary | ICD-10-CM | POA: Diagnosis not present

## 2018-03-26 HISTORY — PX: ESOPHAGOGASTRODUODENOSCOPY (EGD) WITH PROPOFOL: SHX5813

## 2018-03-26 HISTORY — PX: COLONOSCOPY WITH PROPOFOL: SHX5780

## 2018-03-26 SURGERY — COLONOSCOPY WITH PROPOFOL
Anesthesia: General

## 2018-03-26 MED ORDER — PROPOFOL 10 MG/ML IV BOLUS
INTRAVENOUS | Status: DC | PRN
  Administered 2018-03-26: 100 mg via INTRAVENOUS

## 2018-03-26 MED ORDER — PROPOFOL 500 MG/50ML IV EMUL
INTRAVENOUS | Status: AC
  Filled 2018-03-26: qty 50

## 2018-03-26 MED ORDER — FENTANYL CITRATE (PF) 100 MCG/2ML IJ SOLN
INTRAMUSCULAR | Status: DC | PRN
  Administered 2018-03-26 (×2): 50 ug via INTRAVENOUS

## 2018-03-26 MED ORDER — PROPOFOL 500 MG/50ML IV EMUL
INTRAVENOUS | Status: DC | PRN
  Administered 2018-03-26: 200 ug/kg/min via INTRAVENOUS

## 2018-03-26 MED ORDER — LIDOCAINE 2% (20 MG/ML) 5 ML SYRINGE
INTRAMUSCULAR | Status: DC | PRN
  Administered 2018-03-26: 40 mg via INTRAVENOUS

## 2018-03-26 MED ORDER — SODIUM CHLORIDE 0.9 % IV SOLN
INTRAVENOUS | Status: DC
  Administered 2018-03-26: 14:00:00 via INTRAVENOUS

## 2018-03-26 MED ORDER — FENTANYL CITRATE (PF) 100 MCG/2ML IJ SOLN
INTRAMUSCULAR | Status: AC
  Filled 2018-03-26: qty 2

## 2018-03-26 NOTE — H&P (Signed)
Jonathon Bellows, MD [address], [place], [place], [place], [address] [address], [place], [place], [place], [address] Phone: [phone]  Fax: [phone]  Primary Care Physician:  Arnetha Courser, MD   Pre-Procedure History & Physical: HPI:  Wendy Kent is a 47 y.o. female is here for an endoscopy and colonoscopy    Past Medical History:  Diagnosis Date  . Allergy   . Blood transfusion without reported diagnosis   . BP (high blood pressure) 12/29/2012  . Breast hypertrophy 11/10/2014  . GERD (gastroesophageal reflux disease)   . Thyroid disease    patient was cleared by specialist. History of Thyroid nodules.    Past Surgical History:  Procedure Laterality Date  . ABDOMINAL HYSTERECTOMY     patient still has ovaries  . BREAST REDUCTION SURGERY Bilateral 08/05/2017   Procedure: BREAST REDUCTION WITH LIPOSUCTION;  Surgeon: Cristine Polio, MD;  Location: [place];  Service: Plastics;  Laterality: Bilateral;  . CHOLECYSTECTOMY    . SHOULDER ARTHROSCOPY Right   . TUBAL LIGATION      Prior to Admission medications   Medication Sig Start Date End Date Taking? Authorizing Provider  cholestyramine (QUESTRAN) 4 g packet Take 1 packet (4 g total) by mouth 2 (two) times daily. 03/17/18 05/17/18 Yes Jonathon Bellows, MD  ferrous sulfate (FERROUSUL) 325 (65 FE) MG tablet Take 1 tablet (325 mg total) by mouth 2 (two) times daily with a meal. 03/17/18 06/17/18 Yes Jonathon Bellows, MD  hydrochlorothiazide (HYDRODIURIL) 25 MG tablet Take 1 tablet (25 mg total) by mouth daily. 10/23/17  Yes Lada, Satira Anis, MD  Insulin Pen Needle (B-D UF III MINI PEN NEEDLES) 31G X 5 MM MISC For use with Saxenda, one injection subcutaneously once a day 12/13/17  Yes Lada, Satira Anis, MD  SAXENDA 18 MG/3ML SOPN INJECT 3 MG INTO THE SKIN DAILY. 02/11/18  Yes Lada, Satira Anis, MD  fluticasone (FLONASE) 50 MCG/ACT nasal spray Place 2 sprays into both nostrils daily as needed. 09/12/17   Arnetha Courser, MD    Allergies as of 03/18/2018 - Review Complete 03/17/2018  Allergen Reaction Noted  . Percocet [oxycodone-acetaminophen] Anaphylaxis 09/28/2014    History reviewed. No pertinent family history.  Social History   Socioeconomic History  . Marital status: Single    Spouse name: Not on file  . Number of children: 4  . Years of education: 50  . Highest education level: Associate degree: academic program  Occupational History  . Occupation: TEFL teacher    Comment: [employer]  Social Needs  . Financial resource strain: Not hard at all  . Food insecurity:    Worry: Sometimes true    Inability: Sometimes true  . Transportation needs:    Medical: No    Non-medical: No  Tobacco Use  . Smoking status: Never Smoker  . Smokeless tobacco: Never Used  Substance and Sexual Activity  . Alcohol use: Yes    Alcohol/week: 0.0 standard drinks    Comment: socially  . Drug use: No  . Sexual activity: Not Currently    Birth control/protection: Surgical  Lifestyle  . Physical activity:    Days per week: 5 days    Minutes per session: 60 min  . Stress: Not at all  Relationships  . Social connections:    Talks on phone: Twice a week    Gets together: Twice a week    Attends religious service: More than 4 times per year  Active member of club or organization: Yes    Attends meetings of clubs or organizations: More than 4 times per year    Relationship status: Divorced  . Intimate partner violence:    Fear of current or ex partner: No    Emotionally abused: No    Physically abused: No    Forced sexual activity: No  Other Topics Concern  . Not on file  Social History Narrative  . Not on file    Review of Systems: See HPI, otherwise negative ROS  Physical Exam: BP 130/81   Pulse 90   Temp (!) 97 F (36.1 C) (Tympanic)   Resp 16   SpO2 100%  General:   Alert,  pleasant and cooperative in NAD Head:  Normocephalic and atraumatic. Neck:  Supple; no masses or  thyromegaly. Lungs:  Clear throughout to auscultation, normal respiratory effort.    Heart:  +S1, +S2, Regular rate and rhythm, No edema. Abdomen:  Soft, nontender and nondistended. Normal bowel sounds, without guarding, and without rebound.   Neurologic:  Alert and  oriented x4;  grossly normal neurologically.  Impression/Plan: Wendy Kent is here for an endoscopy and colonoscopy  to be performed for  evaluation of iron deficiency anemia    Risks, benefits, limitations, and alternatives regarding endoscopy have been reviewed with the patient.  Questions have been answered.  All parties agreeable.   Jonathon Bellows, MD  03/26/2018, 1:52 PM

## 2018-03-26 NOTE — Anesthesia Preprocedure Evaluation (Signed)
Anesthesia Evaluation  Patient identified by MRN, date of birth, ID band Patient awake    Reviewed: Allergy & Precautions, H&P , NPO status , reviewed documented beta blocker date and time   Airway Mallampati: II  TM Distance: >3 FB Neck ROM: full    Dental  (+) Upper Dentures   Pulmonary neg sleep apnea,    Pulmonary exam normal        Cardiovascular hypertension, Normal cardiovascular exam     Neuro/Psych    GI/Hepatic GERD  Controlled,  Endo/Other  Hyperthyroidism Morbid obesity  Renal/GU      Musculoskeletal   Abdominal   Peds  Hematology  (+) Blood dyscrasia, anemia ,   Anesthesia Other Findings Past Medical History: No date: Allergy No date: Blood transfusion without reported diagnosis 12/29/2012: BP (high blood pressure) 11/10/2014: Breast hypertrophy No date: GERD (gastroesophageal reflux disease) No date: Thyroid disease     Comment:  patient was cleared by specialist. History of Thyroid               nodules.  Past Surgical History: No date: ABDOMINAL HYSTERECTOMY     Comment:  patient still has ovaries 08/05/2017: BREAST REDUCTION SURGERY; Bilateral     Comment:  Procedure: BREAST REDUCTION WITH LIPOSUCTION;  Surgeon:               Cristine Polio, MD;  Location: [employer];  Service: Plastics;  Laterality: Bilateral; No date: CHOLECYSTECTOMY No date: SHOULDER ARTHROSCOPY; Right No date: TUBAL LIGATION     Reproductive/Obstetrics                             Anesthesia Physical Anesthesia Plan  ASA: III  Anesthesia Plan: General   Post-op Pain Management:    Induction: Intravenous  PONV Risk Score and Plan: 3 and Treatment may vary due to age or medical condition and TIVA  Airway Management Planned: Nasal Cannula and Natural Airway  Additional Equipment:   Intra-op Plan:   Post-operative Plan:   Informed Consent: I have  reviewed the patients History and Physical, chart, labs and discussed the procedure including the risks, benefits and alternatives for the proposed anesthesia with the patient or authorized representative who has indicated his/her understanding and acceptance.   Dental Advisory Given  Plan Discussed with: CRNA  Anesthesia Plan Comments:         Anesthesia Quick Evaluation

## 2018-03-26 NOTE — Transfer of Care (Signed)
Immediate Anesthesia Transfer of Care Note  Patient: Wendy Kent  Procedure(s) Performed: COLONOSCOPY WITH PROPOFOL (N/A ) ESOPHAGOGASTRODUODENOSCOPY (EGD) WITH PROPOFOL (N/A )  Patient Location: PACU and Endoscopy Unit  Anesthesia Type:General  Level of Consciousness: awake  Airway & Oxygen Therapy: Patient Spontanous Breathing and Patient connected to nasal cannula oxygen  Post-op Assessment: Report given to RN and Post -op Vital signs reviewed and stable  Post vital signs: Reviewed and stable  Last Vitals:  Vitals Value Taken Time  BP    Temp    Pulse    Resp    SpO2      Last Pain:  Vitals:   03/26/18 1315  TempSrc: Tympanic  PainSc: 0-No pain         Complications: No apparent anesthesia complications

## 2018-03-26 NOTE — Anesthesia Post-op Follow-up Note (Signed)
Anesthesia QCDR form completed.        

## 2018-03-27 ENCOUNTER — Encounter: Payer: Self-pay | Admitting: Gastroenterology

## 2018-03-27 NOTE — Anesthesia Postprocedure Evaluation (Signed)
Anesthesia Post Note  Patient: Wendy Kent  Procedure(s) Performed: COLONOSCOPY WITH PROPOFOL (N/A ) ESOPHAGOGASTRODUODENOSCOPY (EGD) WITH PROPOFOL (N/A )  Patient location during evaluation: Endoscopy Anesthesia Type: General Level of consciousness: awake and alert Pain management: pain level controlled Vital Signs Assessment: post-procedure vital signs reviewed and stable Respiratory status: spontaneous breathing, nonlabored ventilation and respiratory function stable Cardiovascular status: blood pressure returned to baseline and stable Postop Assessment: no apparent nausea or vomiting Anesthetic complications: no     Last Vitals:  Vitals:   03/26/18 1500 03/26/18 1505  BP:  131/90  Pulse: 98   Resp: 11   Temp:    SpO2: 100%     Last Pain:  Vitals:   03/26/18 1505  TempSrc:   PainSc: 0-No pain                 Alphonsus Sias

## 2018-03-27 NOTE — Op Note (Signed)
[facility] Gastroenterology Patient Name: Wendy Kent Procedure Date: 03/26/2018 1:54 PM MRN: [id number] Account #: [id number] Date of Birth: 1970/11/18 Admit Type: Outpatient Age: 47 Room: [facility] ENDO ROOM 1 Gender: Female Note Status: Finalized Procedure:            Colonoscopy Indications:          Iron deficiency anemia Providers:            Jonathon Bellows MD, MD Medicines:            Monitored Anesthesia Care Complications:        No immediate complications. Procedure:            Pre-Anesthesia Assessment:                       - Prior to the procedure, a History and Physical was                        performed, and patient medications, allergies and                        sensitivities were reviewed. The patient's tolerance of                        previous anesthesia was reviewed.                       - The risks and benefits of the procedure and the                        sedation options and risks were discussed with the                        patient. All questions were answered and informed                        consent was obtained.                       - ASA Grade Assessment: II - A patient with mild                        systemic disease.                       After obtaining informed consent, the colonoscope was                        passed under direct vision. Throughout the procedure,                        the patient's blood pressure, pulse, and oxygen                        saturations were monitored continuously. The                        Colonoscope was introduced through the anus and                        advanced to the the cecum, identified by the  appendiceal orifice, IC valve and transillumination.                        The colonoscopy was performed with ease. The patient                        tolerated the procedure well. The quality of the bowel                        preparation was good. Findings:       A 3 mm polyp was found in the ascending colon. The polyp was sessile.       The polyp was removed with a cold biopsy forceps. Resection and       retrieval were complete.      The exam was otherwise without abnormality on direct and retroflexion       views.      Multiple small-mouthed diverticula were found in the entire colon.      The exam was otherwise without abnormality on direct and retroflexion       views. Impression:           - One 3 mm polyp in the ascending colon, removed with a                        cold biopsy forceps. Resected and retrieved.                       - The examination was otherwise normal on direct and                        retroflexion views. Recommendation:       - Discharge patient to home (with escort).                       - Resume previous diet.                       - Continue present medications.                       - Await pathology results.                       - Repeat colonoscopy in 5-10 years for surveillance                        based on pathology results.                       - To visualize the small bowel, perform video capsule                        endoscopy in 2 weeks. Procedure Code(s):    --- Professional ---                       [phone], Colonoscopy, flexible; with biopsy, single or                        multiple Diagnosis Code(s):    --- Professional ---  D12.2, Benign neoplasm of ascending colon                       D50.9, Iron deficiency anemia, unspecified CPT copyright 2018 American Medical Association. All rights reserved. The codes documented in this report are preliminary and upon coder review may  be revised to meet current compliance requirements. Jonathon Bellows, MD Jonathon Bellows MD, MD 03/26/2018 2:52:57 PM This report has been signed electronically. Number of Addenda: 0 Note Initiated On: 03/26/2018 1:54 PM      [facility]

## 2018-03-27 NOTE — Op Note (Addendum)
[facility] Gastroenterology Patient Name: Wendy Kent Procedure Date: 03/26/2018 1:55 PM MRN: [id number] Account #: [id number] Date of Birth: [birth year] Admit Type: Outpatient Age: 47 Room: [facility] ENDO ROOM 1 Gender: Female Note Status: Finalized Procedure:            Upper GI endoscopy Indications:          Iron deficiency anemia Providers:            Jonathon Bellows MD, MD Referring MD:         Arnetha Courser (Referring MD) Medicines:            Monitored Anesthesia Care Complications:        No immediate complications. Procedure:            Pre-Anesthesia Assessment:                       - Prior to the procedure, a History and Physical was                        performed, and patient medications, allergies and                        sensitivities were reviewed. The patient's tolerance of                        previous anesthesia was reviewed.                       - The risks and benefits of the procedure and the                        sedation options and risks were discussed with the                        patient. All questions were answered and informed                        consent was obtained.                       - ASA Grade Assessment: II - A patient with mild                        systemic disease.                       After obtaining informed consent, the endoscope was                        passed under direct vision. Throughout the procedure,                        the patient's blood pressure, pulse, and oxygen                        saturations were monitored continuously. The Endoscope                        was introduced through the mouth, and advanced to the  third part of duodenum. The upper GI endoscopy was                        accomplished with ease. The patient tolerated the                        procedure well. Findings:      The esophagus was normal.      The stomach was normal.      The examined  duodenum was normal. Impression:           - Normal esophagus.                       - Normal stomach.                       - Normal examined duodenum.                       - No specimens collected. Recommendation:       - Discharge patient to home (with escort).                       - Resume previous diet.                       - Continue present medications.                       - Perform a colonoscopy today. Procedure Code(s):    --- Professional ---                       [phone], Esophagogastroduodenoscopy, flexible, transoral;                        diagnostic, including collection of specimen(s) by                        brushing or washing, when performed (separate procedure) Diagnosis Code(s):    --- Professional ---                       D50.9, Iron deficiency anemia, unspecified CPT copyright 2018 American Medical Association. All rights reserved. The codes documented in this report are preliminary and upon coder review may  be revised to meet current compliance requirements. Jonathon Bellows, MD Jonathon Bellows MD, MD 03/26/2018 2:42:11 PM This report has been signed electronically. Number of Addenda: 0 Note Initiated On: 03/26/2018 1:55 PM Scope Withdrawal Time: 0 hours 21 minutes 4 seconds  Total Procedure Duration: 0 hours 24 minutes 47 seconds       [facility]

## 2018-03-28 ENCOUNTER — Telehealth: Payer: Self-pay

## 2018-03-28 ENCOUNTER — Other Ambulatory Visit: Payer: Self-pay

## 2018-03-28 DIAGNOSIS — D509 Iron deficiency anemia, unspecified: Secondary | ICD-10-CM

## 2018-03-28 LAB — SURGICAL PATHOLOGY

## 2018-03-28 NOTE — Telephone Encounter (Signed)
-----   Message from Jonathon Bellows, MD sent at 03/26/2018  2:56 PM EST ----- Regarding: please arrange appointment  Wendy Kent  Please arrange capsule study of the small bowel    Regards    Dr Jonathon Bellows  Gastroenterology/Hepatology Pager: [phone]

## 2018-03-28 NOTE — Telephone Encounter (Signed)
Spoke with pt and informed her of Dr. Georgeann Oppenheim instructions for pt to schedule a capsule study. Pt agrees. I have explained prior prep instructions and appointment information. Pt is aware that she'll receive printed instructions via mail.

## 2018-03-31 ENCOUNTER — Telehealth: Payer: Self-pay | Admitting: Gastroenterology

## 2018-03-31 ENCOUNTER — Encounter: Payer: Self-pay | Admitting: Gastroenterology

## 2018-03-31 NOTE — Telephone Encounter (Signed)
Pt took a medication that caused her to severely constipated. Would like to see if we can prescribe a diff medication? Pls call

## 2018-04-01 NOTE — Telephone Encounter (Signed)
Called pt regarding her complaint of experiencing constipation after taking medication prescribed.  Unable to contact LVM to return call.

## 2018-04-03 ENCOUNTER — Other Ambulatory Visit: Payer: Self-pay

## 2018-04-03 MED ORDER — FERROUS GLUCONATE 225 (27 FE) MG PO TABS: 240.0000 mg | ORAL_TABLET | Freq: Two times a day (BID) | ORAL | 0 refills | Status: DC

## 2018-04-03 NOTE — Telephone Encounter (Signed)
Yes please change - I suspect will not change much for constipatiobn , can try , also can do some miralax as well

## 2018-04-03 NOTE — Telephone Encounter (Signed)
Spoke with pt regarding her complaint of constipation. Pt states the constipation began after starting the oral iron supplements prescribed at her last visit with Dr. Vicente Males. Pt is requesting a different iron supplement. I explained that I will consult with Dr. Vicente Males and then advise.

## 2018-04-04 NOTE — Telephone Encounter (Signed)
Called pt to inform her that we have sent her new prescription of Fergon (iron supplement) to her preferred pharmacy. Also to inform her of Dr. Georgeann Oppenheim suggestion to take miralax if constipation does not improve. Unable to contact. LVM

## 2018-04-04 NOTE — Addendum Note (Signed)
Addended by: Dorethea Clan on: 04/04/2018 01:56 PM   Modules accepted: Orders

## 2018-04-14 ENCOUNTER — Ambulatory Visit
Admission: RE | Admit: 2018-04-14 | Discharge: 2018-04-14 | Disposition: A | Discharge status: Home / Self Care | Payer: BLUE CROSS/BLUE SHIELD | Attending: Gastroenterology | Admitting: Gastroenterology

## 2018-04-14 ENCOUNTER — Encounter
Admission: RE | Disposition: A | Discharge status: Home / Self Care | Payer: Self-pay | Source: Home / Self Care | Attending: Gastroenterology

## 2018-04-14 DIAGNOSIS — D509 Iron deficiency anemia, unspecified: Secondary | ICD-10-CM | POA: Diagnosis not present

## 2018-04-14 HISTORY — PX: GIVENS CAPSULE STUDY: SHX5432

## 2018-04-14 SURGERY — IMAGING PROCEDURE, GI TRACT, INTRALUMINAL, VIA CAPSULE

## 2018-04-18 ENCOUNTER — Encounter: Payer: Self-pay | Admitting: Gastroenterology

## 2018-04-21 ENCOUNTER — Other Ambulatory Visit: Payer: Self-pay | Admitting: Family Medicine

## 2018-04-21 NOTE — Telephone Encounter (Signed)
Wt Readings from Last 3 Encounters:  03/17/18 219 lb 9.6 oz (99.6 kg)  01/31/18 213 lb 3.2 oz (96.7 kg)  01/28/18 215 lb 1.6 oz (97.6 kg)   Weight was 212 pounds on Sep 12, 2017  She has not lost 4% of her body weight  We won't be able to continue the [place]  Please offer a referral to a bariatric clinic for more intensive weight loss management

## 2018-04-22 NOTE — Telephone Encounter (Signed)
Left detailed VM, CRM created.  

## 2018-05-01 ENCOUNTER — Ambulatory Visit: Payer: BLUE CROSS/BLUE SHIELD | Admitting: Gastroenterology

## 2018-05-14 ENCOUNTER — Ambulatory Visit: Payer: BLUE CROSS/BLUE SHIELD | Admitting: Gastroenterology

## 2018-05-14 ENCOUNTER — Encounter: Payer: Self-pay | Admitting: Gastroenterology

## 2018-05-14 VITALS — BP 114/81 | HR 111 | Ht 62.0 in | Wt 215.2 lb

## 2018-05-14 DIAGNOSIS — R197 Diarrhea, unspecified: Secondary | ICD-10-CM | POA: Diagnosis not present

## 2018-05-14 DIAGNOSIS — D509 Iron deficiency anemia, unspecified: Secondary | ICD-10-CM | POA: Diagnosis not present

## 2018-05-14 NOTE — Progress Notes (Signed)
Jonathon Bellows MD, MRCP(U.K) [address]  [place]  [place], [place] [address]  Main: [phone]  Fax: [phone]   Primary Care Physician: Arnetha Courser, MD  Primary Gastroenterologist:  Dr. Jonathon Bellows   Chief Complaint  Patient presents with  . Follow-up    Iron Deficiency Anemia    HPI: Wendy Kent is a 48 y.o. female   She is here today to follow up for iron deficiency anemia and gas. Initially referred and seen in 02/2018 . Had been on ibuprofen 3 times a week for many years.    11/2017 : Hb 11.2 ,MCV 82 and elevated platelet count.Ferritin 10 . Hb  was normal 10 months earlier.   03/17/2018 : C diff, calprotectin,b12,folate,celiac serology,UA,GI pcr  normal, (b12 high).   03/26/2018 : EGD: Normal, colonoscopy, 67mm tubular adenoma excised.  04/2018: Normal capsule study   No diarrhea , no issues , doing well , stopped alleve, taking oral tablets. Questran helps a lot - BID. Has had her Gall bladder taken out.  Current Outpatient Medications  Medication Sig Dispense Refill  . cholestyramine (QUESTRAN) 4 g packet Take 1 packet (4 g total) by mouth 2 (two) times daily. 60 each 12  . ferrous gluconate (FERGON) 225 (27 Fe) MG tablet Take 1 tablet (240 mg total) by mouth 2 (two) times daily with a meal. 180 tablet 0  . fluticasone (FLONASE) 50 MCG/ACT nasal spray Place 2 sprays into both nostrils daily as needed. 48 g 3  . hydrochlorothiazide (HYDRODIURIL) 25 MG tablet Take 1 tablet (25 mg total) by mouth daily. 90 tablet 1   No current facility-administered medications for this visit.     Allergies as of 05/14/2018 - Review Complete 05/14/2018  Allergen Reaction Noted  . Percocet [oxycodone-acetaminophen] Anaphylaxis 09/28/2014    ROS:  General: Negative for anorexia, weight loss, fever, chills, fatigue, weakness. ENT: Negative for hoarseness, difficulty swallowing , nasal congestion. CV: Negative for chest pain, angina, palpitations, dyspnea on  exertion, peripheral edema.  Respiratory: Negative for dyspnea at rest, dyspnea on exertion, cough, sputum, wheezing.  GI: See history of present illness. GU:  Negative for dysuria, hematuria, urinary incontinence, urinary frequency, nocturnal urination.  Endo: Negative for unusual weight change.    Physical Examination:   BP 114/81   Pulse (!) 111   Ht 5\' 2"  (1.575 m)   Wt 215 lb 3.2 oz (97.6 kg)   BMI 39.36 kg/m   General: Well-nourished, well-developed in no acute distress.  Eyes: No icterus. Conjunctivae pink. Mouth: Oropharyngeal mucosa moist and pink , no lesions erythema or exudate. Lungs: Clear to auscultation bilaterally. Non-labored. Heart: Regular rate and rhythm, no murmurs rubs or gallops.  Abdomen: Bowel sounds are normal, nontender, nondistended, no hepatosplenomegaly or masses, no abdominal bruits or hernia , no rebound or guarding.   Extremities: No lower extremity edema. No clubbing or deformities. Neuro: Alert and oriented x 3.  Grossly intact. Skin: Warm and dry, no jaundice.   Psych: Alert and cooperative, normal mood and affect.   Imaging Studies: No results found.  Assessment and Plan:   Gabreille Dardis is a 48 y.o. y/o female here to follow up  for iron deficiency anemia, gas. Long term NSAID use. Iron deficiency likely secondary to chronic NSAID use. EGD,Colonoscpy and capsule study were normal. Diarrhea could be bile salt diarrhea since she has had a cholecystectomy and responded well to [place]   1.Stop all NSAID's 2. Check iron studies and CBC  today - if stable and improving on oral iron will repeat in 3 months  .  Dr Jonathon Bellows  MD,MRCP [facility]) Follow up in 3 months

## 2018-05-15 LAB — CBC WITH DIFFERENTIAL/PLATELET
Basophils Absolute: 0 10*3/uL (ref 0.0–0.2)
Basos: 1 %
EOS (ABSOLUTE): 0.1 10*3/uL (ref 0.0–0.4)
Eos: 1 %
Hematocrit: 35.2 % (ref 34.0–46.6)
Hemoglobin: 11.9 g/dL (ref 11.1–15.9)
Immature Grans (Abs): 0 10*3/uL (ref 0.0–0.1)
Immature Granulocytes: 0 %
Lymphocytes Absolute: 1.5 10*3/uL (ref 0.7–3.1)
Lymphs: 25 %
MCH: 28.3 pg (ref 26.6–33.0)
MCHC: 33.8 g/dL (ref 31.5–35.7)
MCV: 84 fL (ref 79–97)
Monocytes Absolute: 0.3 10*3/uL (ref 0.1–0.9)
Monocytes: 5 %
Neutrophils Absolute: 4.2 10*3/uL (ref 1.4–7.0)
Neutrophils: 68 %
Platelets: 340 10*3/uL (ref 150–450)
RBC: 4.21 x10E6/uL (ref 3.77–5.28)
RDW: 14.8 % (ref 11.7–15.4)
WBC: 6.1 10*3/uL (ref 3.4–10.8)

## 2018-05-15 LAB — IRON,TIBC AND FERRITIN PANEL
Ferritin: 40 ng/mL (ref 15–150)
Iron Saturation: 9 % — CL (ref 15–55)
Iron: 28 ug/dL (ref 27–159)
Total Iron Binding Capacity: 299 ug/dL (ref 250–450)
UIBC: 271 ug/dL (ref 131–425)

## 2018-05-22 ENCOUNTER — Telehealth: Payer: Self-pay

## 2018-05-22 NOTE — Telephone Encounter (Signed)
-----   Message from Jonathon Bellows, MD sent at 05/19/2018 10:36 AM EST ----- Sherald Hess inform HB is up and iron studies almost getting back towards normal. Continue oral iron for 3 more months, no NSAID's, in 3 months will plan to d/c oral iron and watch.   C/c Arnetha Courser, MD   Dr Jonathon Bellows MD,MRCP [facility]) Gastroenterology/Hepatology Pager: [phone]

## 2018-05-22 NOTE — Telephone Encounter (Signed)
Spoke with pt and informed her of lab results and Dr. Georgeann Oppenheim instructions.

## 2018-05-27 ENCOUNTER — Encounter: Payer: Self-pay | Admitting: Gastroenterology

## 2018-06-22 ENCOUNTER — Other Ambulatory Visit: Payer: Self-pay | Admitting: Gastroenterology

## 2018-07-30 DIAGNOSIS — M542 Cervicalgia: Secondary | ICD-10-CM | POA: Diagnosis not present

## 2018-07-30 DIAGNOSIS — M546 Pain in thoracic spine: Secondary | ICD-10-CM | POA: Diagnosis not present

## 2018-07-30 DIAGNOSIS — N62 Hypertrophy of breast: Secondary | ICD-10-CM | POA: Diagnosis not present

## 2018-08-14 ENCOUNTER — Ambulatory Visit: Payer: BLUE CROSS/BLUE SHIELD | Admitting: Gastroenterology

## 2018-08-14 ENCOUNTER — Ambulatory Visit (INDEPENDENT_AMBULATORY_CARE_PROVIDER_SITE_OTHER): Payer: BLUE CROSS/BLUE SHIELD | Admitting: Gastroenterology

## 2018-08-14 DIAGNOSIS — D509 Iron deficiency anemia, unspecified: Secondary | ICD-10-CM | POA: Diagnosis not present

## 2018-08-14 NOTE — Progress Notes (Signed)
Wendy Kent , MD [address]  [place]  [place], [place] [address]  Main: [phone]  Fax: [phone]   Primary Care Physician: Arnetha Courser, MD  Virtual Visit via Video Note  I connected with patient on 08/14/18 at 10:00 AM EDT by video and verified that I am speaking with the correct person using two identifiers.   I discussed the limitations, risks, security and privacy concerns of performing an evaluation and management service by video  and the availability of in person appointments. I also discussed with the patient that there may be a patient responsible charge related to this service. The patient expressed understanding and agreed to proceed.  Location of Patient: Home Location of Provider: Home Persons involved: Patient and provider only   History of Present Illness: Chief Complaint  Patient presents with  . Follow-up    Iron deficiency Anemia    HPI: Wendy Kent is a 48 y.o. female    Summary of history :  She is here today to follow up for iron deficiency anemia and gas. Initially referred and seen in 02/2018 . Had been on ibuprofen 3 times a week for many years.    11/2017 : Hb 11.2 ,MCV 82 and elevated platelet count.Ferritin 10 . Hb was normal 10 months earlier.  03/17/2018 : C diff, calprotectin,b12,folate,celiac serology,UA,GI pcr  normal, (b12 high).  03/26/2018 : EGD: Normal, colonoscopy, 6mm tubular adenoma excised.  04/2018: Normal capsule study   No diarrhea , no issues , doing well , stopped alleve, taking oral tablets. Questran helps a lot - BID. Has had her Gall bladder taken out.   Interval history   05/14/2018-  08/14/2018  Doing well no issues- takes iron a few times a week     CBC Latest Ref Rng & Units 05/14/2018 12/13/2017 04/30/2017  WBC 3.4 - 10.8 x10E3/uL 6.1 8.1 8.9  Hemoglobin 11.1 - 15.9 g/dL 11.9 11.2(L) 12.2  Hematocrit 34.0 - 46.6 % 35.2 34.1(L) 36.9  Platelets 150 - 450 x10E3/uL 340 418(H) 371    Iron/TIBC/Ferritin/ %Sat    Component Value Date/Time   IRON 28 05/14/2018 0918   TIBC 299 05/14/2018 0918   FERRITIN 40 05/14/2018 0918   IRONPCTSAT 9 (LL) 05/14/2018 0918   IRONPCTSAT 5 (L) 12/13/2017 1632        Current Outpatient Medications  Medication Sig Dispense Refill  . fluticasone (FLONASE) 50 MCG/ACT nasal spray Place 2 sprays into both nostrils daily as needed. 48 g 3  . hydrochlorothiazide (HYDRODIURIL) 25 MG tablet Take 1 tablet (25 mg total) by mouth daily. 90 tablet 1  . cholestyramine (QUESTRAN) 4 g packet Take 1 packet (4 g total) by mouth 2 (two) times daily. 60 each 12  . ferrous gluconate (FERGON) 225 (27 Fe) MG tablet Take 1 tablet (240 mg total) by mouth 2 (two) times daily with a meal. 180 tablet 0   No current facility-administered medications for this visit.     Allergies as of 08/14/2018 - Review Complete 08/14/2018  Allergen Reaction Noted  . Percocet [oxycodone-acetaminophen] Anaphylaxis 09/28/2014    Review of Systems:    All systems reviewed and negative except where noted in HPI.  General Appearance:    Alert, cooperative, no distress, appears stated age  Head:    Normocephalic, without obvious abnormality, atraumatic  Eyes:    PERRL, conjunctiva/corneas clear,  Ears:    Grossly normal hearing    Neurologic:  Grossly normal    Observations/Objective:  Labs:  CMP     Component Value Date/Time   NA 137 07/30/2017 0730   NA 139 11/10/2014 0923   K 4.1 07/30/2017 0730   CL 101 07/30/2017 0730   CO2 27 07/30/2017 0730   GLUCOSE 90 07/30/2017 0730   BUN 11 07/30/2017 0730   BUN 10 11/10/2014 0923   CREATININE 0.75 07/30/2017 0730   CREATININE 0.60 03/06/2017 0911   CALCIUM 9.3 07/30/2017 0730   PROT 7.0 03/06/2017 0911   PROT 7.4 11/10/2014 0923   ALBUMIN 3.7 12/22/2015 1421   ALBUMIN 4.3 11/10/2014 0923   AST 10 03/06/2017 0911   ALT 9 03/06/2017 0911   ALKPHOS 59 12/22/2015 1421   BILITOT 0.4 03/06/2017 0911   BILITOT 0.7  11/10/2014 0923   GFRNONAA >60 07/30/2017 0730   GFRNONAA 109 03/06/2017 0911   GFRAA >60 07/30/2017 0730   GFRAA 127 03/06/2017 0911   Lab Results  Component Value Date   WBC 6.1 05/14/2018   HGB 11.9 05/14/2018   HCT 35.2 05/14/2018   MCV 84 05/14/2018   PLT 340 05/14/2018    Imaging Studies: No results found.  Assessment and Plan:   Wendy Kent is a 48 y.o. y/o female  here to follow up  for iron deficiency anemia, gas. Long term NSAID use. Iron deficiency likely secondary to chronic NSAID use. EGD,Colonoscpy and capsule study were normal.    Plan   1.Check iron studies and CBC today - if stable, stop iron and repeat studies in 3-4 months   F/u in 6 months    I discussed the assessment and treatment plan with the patient. The patient was provided an opportunity to ask questions and all were answered. The patient agreed with the plan and demonstrated an understanding of the instructions.   The patient was advised to call back or seek an in-person evaluation if the symptoms worsen or if the condition fails to improve as anticipated.    Dr Wendy Bellows MD,MRCP [facility]) Gastroenterology/Hepatology Pager: [phone]   Speech recognition software was used to dictate this note.

## 2018-08-25 DIAGNOSIS — D509 Iron deficiency anemia, unspecified: Secondary | ICD-10-CM | POA: Diagnosis not present

## 2018-08-26 LAB — CBC WITH DIFFERENTIAL/PLATELET
Basophils Absolute: 0 10*3/uL (ref 0.0–0.2)
Basos: 1 %
EOS (ABSOLUTE): 0.1 10*3/uL (ref 0.0–0.4)
Eos: 2 %
Hematocrit: 34.9 % (ref 34.0–46.6)
Hemoglobin: 12 g/dL (ref 11.1–15.9)
Immature Grans (Abs): 0 10*3/uL (ref 0.0–0.1)
Immature Granulocytes: 0 %
Lymphocytes Absolute: 2.3 10*3/uL (ref 0.7–3.1)
Lymphs: 27 %
MCH: 28.7 pg (ref 26.6–33.0)
MCHC: 34.4 g/dL (ref 31.5–35.7)
MCV: 84 fL (ref 79–97)
Monocytes Absolute: 0.6 10*3/uL (ref 0.1–0.9)
Monocytes: 7 %
Neutrophils Absolute: 5.5 10*3/uL (ref 1.4–7.0)
Neutrophils: 63 %
Platelets: 351 10*3/uL (ref 150–450)
RBC: 4.18 x10E6/uL (ref 3.77–5.28)
RDW: 14.9 % (ref 11.7–15.4)
WBC: 8.6 10*3/uL (ref 3.4–10.8)

## 2018-08-26 LAB — IRON,TIBC AND FERRITIN PANEL
Ferritin: 35 ng/mL (ref 15–150)
Iron Saturation: 22 % (ref 15–55)
Iron: 71 ug/dL (ref 27–159)
Total Iron Binding Capacity: 318 ug/dL (ref 250–450)
UIBC: 247 ug/dL (ref 131–425)

## 2018-09-22 ENCOUNTER — Telehealth: Payer: Self-pay

## 2018-09-22 NOTE — Telephone Encounter (Signed)
She will need to make an appointe can be virtual

## 2018-09-22 NOTE — Telephone Encounter (Signed)
Copied from [place] [id number]. Topic: General - Inquiry >> Sep 19, 2018  2:56 PM Mathis Bud wrote: Reason for CRM: Patient called stating she is having trouble sleeping.  She hasn't been able to sleep lately, but then lately she has so much energy. Patient would like PCP or nurse to give a call back. Did tell patient PCP is on leave of absence.  Patient call back # [phone]

## 2018-09-24 ENCOUNTER — Ambulatory Visit: Payer: BLUE CROSS/BLUE SHIELD | Admitting: Nurse Practitioner

## 2018-09-24 ENCOUNTER — Encounter: Payer: Self-pay | Admitting: Nurse Practitioner

## 2018-09-24 ENCOUNTER — Other Ambulatory Visit: Payer: Self-pay

## 2018-09-24 VITALS — BP 124/70 | HR 96 | Temp 98.1°F | Resp 14 | Ht 62.0 in | Wt 219.7 lb

## 2018-09-24 DIAGNOSIS — I10 Essential (primary) hypertension: Secondary | ICD-10-CM

## 2018-09-24 DIAGNOSIS — R232 Flushing: Secondary | ICD-10-CM | POA: Diagnosis not present

## 2018-09-24 DIAGNOSIS — G4700 Insomnia, unspecified: Secondary | ICD-10-CM | POA: Diagnosis not present

## 2018-09-24 DIAGNOSIS — I83813 Varicose veins of bilateral lower extremities with pain: Secondary | ICD-10-CM | POA: Diagnosis not present

## 2018-09-24 MED ORDER — CLONIDINE HCL 0.1 MG PO TABS: 0.0500 mg | ORAL_TABLET | Freq: Two times a day (BID) | ORAL | 1 refills | Status: DC

## 2018-09-24 MED ORDER — MELATONIN 3 MG PO TABS: ORAL_TABLET | ORAL | 3 refills | Status: DC

## 2018-09-24 NOTE — Patient Instructions (Addendum)
simple behavioral measures, such as lowering room temperature, using fans, dressing in layers of clothing that can be easily shed, and avoiding triggers (such as spicy foods and stressful situations), can help reduce the number of hot flashes.  Menopause Menopause is the normal time of life when menstrual periods stop completely. It is usually confirmed by 12 months without a menstrual period. The transition to menopause (perimenopause) most often happens between the ages of 15 and 23. During perimenopause, hormone levels change in your body, which can cause symptoms and affect your health. Menopause may increase your risk for:  Loss of bone (osteoporosis), which causes bone breaks (fractures).  Depression.  Hardening and narrowing of the arteries (atherosclerosis), which can cause heart attacks and strokes. What are the causes? This condition is usually caused by a natural change in hormone levels that happens as you get older. The condition may also be caused by surgery to remove both ovaries (bilateral oophorectomy). What increases the risk? This condition is more likely to start at an earlier age if you have certain medical conditions or treatments, including:  A tumor of the pituitary gland in the brain.  A disease that affects the ovaries and hormone production.  Radiation treatment for cancer.  Certain cancer treatments, such as chemotherapy or hormone (anti-estrogen) therapy.  Heavy smoking and excessive alcohol use.  Family history of early menopause. This condition is also more likely to develop earlier in women who are very thin. What are the signs or symptoms? Symptoms of this condition include:  Hot flashes.  Irregular menstrual periods.  Night sweats.  Changes in feelings about sex. This could be a decrease in sex drive or an increased comfort around your sexuality.  Vaginal dryness and thinning of the vaginal walls. This may cause painful intercourse.  Dryness  of the skin and development of wrinkles.  Headaches.  Problems sleeping (insomnia).  Mood swings or irritability.  Memory problems.  Weight gain.  Hair growth on the face and chest.  Bladder infections or problems with urinating. How is this diagnosed? This condition is diagnosed based on your medical history, a physical exam, your age, your menstrual history, and your symptoms. Hormone tests may also be done. How is this treated? In some cases, no treatment is needed. You and your health care provider should make a decision together about whether treatment is necessary. Treatment will be based on your individual condition and preferences. Treatment for this condition focuses on managing symptoms. Treatment may include:  Menopausal hormone therapy (MHT).  Medicines to treat specific symptoms or complications.  Acupuncture.  Vitamin or herbal supplements. Before starting treatment, make sure to let your health care provider know if you have a personal or family history of:  Heart disease.  Breast cancer.  Blood clots.  Diabetes.  Osteoporosis. Follow these instructions at home: Lifestyle  Do not use any products that contain nicotine or tobacco, such as cigarettes and e-cigarettes. If you need help quitting, ask your health care provider.  Get at least 30 minutes of physical activity on 5 or more days each week.  Avoid alcoholic and caffeinated beverages, as well as spicy foods. This may help prevent hot flashes.  Get 7-8 hours of sleep each night.  If you have hot flashes, try: ? Dressing in layers. ? Avoiding things that may trigger hot flashes, such as spicy food, warm places, or stress. ? Taking slow, deep breaths when a hot flash starts. ? Keeping a fan in your home and office.  Find ways to manage stress, such as deep breathing, meditation, or journaling.  Consider going to group therapy with other women who are having menopause symptoms. Ask your health  care provider about recommended group therapy meetings. Eating and drinking  Eat a healthy, balanced diet that contains whole grains, lean protein, low-fat dairy, and plenty of fruits and vegetables.  Your health care provider may recommend adding more soy to your diet. Foods that contain soy include tofu, tempeh, and soy milk.  Eat plenty of foods that contain calcium and vitamin D for bone health. Items that are rich in calcium include low-fat milk, yogurt, beans, almonds, sardines, broccoli, and kale. Medicines  Take over-the-counter and prescription medicines only as told by your health care provider.  Talk with your health care provider before starting any herbal supplements. If prescribed, take vitamins and supplements as told by your health care provider. These may include: ? Calcium. Women age 67 and older should get 1,200 mg (milligrams) of calcium every day. ? Vitamin D. Women need 600-800 International Units of vitamin D each day. ? Vitamins B12 and B6. Aim for 50 micrograms of B12 and 1.5 mg of B6 each day. General instructions  Keep track of your menstrual periods, including: ? When they occur. ? How heavy they are and how long they last. ? How much time passes between periods.  Keep track of your symptoms, noting when they start, how often you have them, and how long they last.  Use vaginal lubricants or moisturizers to help with vaginal dryness and improve comfort during sex.  Keep all follow-up visits as told by your health care provider. This is important. This includes any group therapy or counseling. Contact a health care provider if:  You are still having menstrual periods after age 39.  You have pain during sex.  You have not had a period for 12 months and you develop vaginal bleeding. Get help right away if:  You have: ? Severe depression. ? Excessive vaginal bleeding. ? Pain when you urinate. ? A fast or irregular heart beat (palpitations). ? Severe  headaches. ? Abdomen (abdominal) pain or severe indigestion.  You fell and you think you have a broken bone.  You develop leg or chest pain.  You develop vision problems.  You feel a lump in your breast. Summary  Menopause is the normal time of life when menstrual periods stop completely. It is usually confirmed by 12 months without a menstrual period.  The transition to menopause (perimenopause) most often happens between the ages of 42 and 40.  Symptoms can be managed through medicines, lifestyle changes, and complementary therapies such as acupuncture.  Eat a balanced diet that is rich in nutrients to promote bone health and heart health and to manage symptoms during menopause. This information is not intended to replace advice given to you by your health care provider. Make sure you discuss any questions you have with your health care provider. Document Released: 06/30/2003 Document Revised: 05/12/2016 Document Reviewed: 05/12/2016 Elsevier Interactive Patient Education  2019 [employer].  Sleep Hygiene Tips 1) Get regular. One of the best ways to train your body to sleep well is to go to bed and get up at more or less the same time every day, even on weekends and days off! This regular rhythm will make you feel better and will give your body something to work from. 2) Sleep when sleepy. Only try to sleep when you actually feel tired or sleepy, rather than  spending too much time awake in bed. 3) Get up & try again. If you haven't been able to get to sleep after about 20 minutes or more, get up and do something calming or boring until you feel sleepy, then return to bed and try again. Sit quietly on the couch with the lights off (bright light will tell your brain that it is time to wake up), or read something boring like the phone book. Avoid doing anything that is too stimulating or interesting, as this will wake you up even more. 4) Avoid caffeine & nicotine. It is best  to avoid consuming any caffeine (in coffee, tea, cola drinks, chocolate, and some medications) or nicotine (cigarettes) for at least 4-6 hours before going to bed. These substances act as stimulants and interfere with the ability to fall asleep 5) Avoid alcohol. It is also best to avoid alcohol for at least 4-6 hours before going to bed. Many people believe that alcohol is relaxing and helps them to get to sleep at first, but it actually interrupts the quality of sleep. 6) Bed is for sleeping. Try not to use your bed for anything other than sleeping and sex, so that your body comes to associate bed with sleep. If you use bed as a place to watch TV, eat, read, work on your laptop, pay bills, and other things, your body will not learn this Connection. 7) No naps. It is best to avoid taking naps during the day, to make sure that you are tired at bedtime. If you can't make it through the day without a nap, make sure it is for less than an hour and before 3pm. 8) Sleep rituals. You can develop your own rituals of things to remind your body that it is time to sleep - some people find it useful to do relaxing stretches or breathing exercises for 15 minutes before bed each night, or sit calmly with a cup of caffeine-free tea. 9) Bathtime. Having a hot bath 1-2 hours before bedtime can be useful, as it will raise your body temperature, causing you to feel sleepy as your body temperature drops again. Research shows that sleepiness is associated with a drop in body temperature. 10) No clock-watching. Many people who struggle with sleep tend to watch the clock too much. Frequently checking the clock during the night can wake you up (especially if you turn on the light to read the time) and reinforces negative thoughts such as "Oh no, look how late it is, I'll never get to sleep" or "it's so early, I have only slept for 5 hours, this is terrible." 11) Use a sleep diary. This worksheet can  be a useful way of making sure you have the right facts about your sleep, rather than making assumptions. Because a diary involves watching the clock (see point 10) it is a good idea to only use it for two weeks to get an idea of what is going and then perhaps two months down the track to see how you are progressing. 12) Exercise. Regular exercise is a good idea to help with good sleep, but try not to do strenuous exercise in the 4 hours before bedtime. Morning walks are a great way to start the day feeling refreshed! 13) Eat right. A healthy, balanced diet will help you to sleep well, but timing is important. Some people find that a very empty stomach at bedtime is distracting, so it can be useful to have a light snack, but a  heavy meal soon before bed can also interrupt sleep. Some people recommend a warm glass of milk, which contains tryptophan, which acts as a natural sleep inducer. 14) The right space. It is very important that your bed and bedroom are quiet and comfortable for sleeping. A cooler room with enough blankets to stay warm is best, and make sure you have curtains or an eyemask to block out early morning light and earplugs if there is noise outside your room. 15) Keep daytime routine the same. Even if you have a bad night sleep and are tired it is important that you try to keep your daytime activities the same as you had planned. That is, don't avoid activities because you feel tired. This can reinforce the insomnia.

## 2018-09-24 NOTE — Progress Notes (Signed)
Name: Wendy Kent   MRN: [id number]    DOB: 11/28/1970   Date:09/24/2018       Progress Note  Subjective  Chief Complaint  Chief Complaint  Patient presents with  . Insomnia  . Hot Flashes    HPI  Patient is having hot flashes at night, states was really bad and sweaty but has improved in the past few weeks that her clothes are not soaked anymore. Hot flashes have been going on for about 8 months now but recently when hot flashes wake her up she is no longer able to go back to sleep.    denies palpitations, headache, skin changes, diarrhea, cough, fevers, unexplained weight loss.  Denies history of breast cancer, coronary heart disease (CHD), a previous venous thromboembolic event (VTE) or strokes.   The 10-year ASCVD risk score Mikey Bussing DC Brooke Bonito., et al., 2013) is: 1.4%   Values used to calculate the score:     Age: 48 years     Sex: Female     Is Non-Hispanic African American: Yes     Diabetic: No     Tobacco smoker: No     Systolic Blood Pressure: 573 mmHg     Is BP treated: Yes     HDL Cholesterol: 62 mg/dL     Total Cholesterol: 130 mg/dL   Patient has varicose veins states is starting to get painful and would like referral to vascular.    PHQ2/9: Depression screen [facility] 2/9 09/24/2018 01/31/2018 12/13/2017 09/12/2017 06/14/2017  Decreased Interest 0 0 0 0 0  Down, Depressed, Hopeless 0 0 0 0 0  PHQ - 2 Score 0 0 0 0 0  Altered sleeping 3 - - - -  Tired, decreased energy 0 - - - -  Change in appetite 0 - - - -  Feeling bad or failure about yourself  0 - - - -  Trouble concentrating 0 - - - -  Moving slowly or fidgety/restless 0 - - - -  Suicidal thoughts 0 - - - -  PHQ-9 Score 3 - - - -     PHQ reviewed. Negative  Patient Active Problem List   Diagnosis Date Noted  . Preventative health care 02/03/2018  . Obesity (BMI 35.0-39.9 without comorbidity) 09/12/2017  . Hypokalemia 01/11/2016  . Hepatomegaly 12/22/2015  . Cardiomegaly 12/22/2015  . Lactose intolerance  09/26/2015  . IBS (irritable bowel syndrome) 09/26/2015  . Multinodular goiter 09/28/2014  . Hyperthyroidism, subclinical 09/28/2014  . Allergic rhinitis, seasonal 09/28/2014  . Iron deficiency anemia 12/29/2012  . Hypertension goal BP (blood pressure) < 140/90 12/29/2012  . Anemia 12/29/2012    Past Medical History:  Diagnosis Date  . Allergy   . Blood transfusion without reported diagnosis   . BP (high blood pressure) 12/29/2012  . Breast hypertrophy 11/10/2014  . GERD (gastroesophageal reflux disease)   . Thyroid disease    patient was cleared by specialist. History of Thyroid nodules.    Past Surgical History:  Procedure Laterality Date  . ABDOMINAL HYSTERECTOMY     patient still has ovaries  . BREAST REDUCTION SURGERY Bilateral 08/05/2017   Procedure: BREAST REDUCTION WITH LIPOSUCTION;  Surgeon: Cristine Polio, MD;  Location: [place];  Service: Plastics;  Laterality: Bilateral;  . CHOLECYSTECTOMY    . COLONOSCOPY WITH PROPOFOL N/A 03/26/2018   Procedure: COLONOSCOPY WITH PROPOFOL;  Surgeon: Jonathon Bellows, MD;  Location: [facility] ENDOSCOPY;  Service: Gastroenterology;  Laterality: N/A;  . ESOPHAGOGASTRODUODENOSCOPY (EGD) WITH PROPOFOL N/A 03/26/2018  Procedure: ESOPHAGOGASTRODUODENOSCOPY (EGD) WITH PROPOFOL;  Surgeon: Jonathon Bellows, MD;  Location: [facility] ENDOSCOPY;  Service: Gastroenterology;  Laterality: N/A;  . GIVENS CAPSULE STUDY N/A 04/14/2018   Procedure: GIVENS CAPSULE STUDY;  Surgeon: Jonathon Bellows, MD;  Location: [facility] ENDOSCOPY;  Service: Gastroenterology;  Laterality: N/A;  . SHOULDER ARTHROSCOPY Right   . TUBAL LIGATION      Social History   Tobacco Use  . Smoking status: Never Smoker  . Smokeless tobacco: Never Used  Substance Use Topics  . Alcohol use: Yes    Alcohol/week: 0.0 standard drinks    Comment: socially     Current Outpatient Medications:  .  fluticasone (FLONASE) 50 MCG/ACT nasal spray, Place 2 sprays into both nostrils daily as  needed., Disp: 48 g, Rfl: 3 .  hydrochlorothiazide (HYDRODIURIL) 25 MG tablet, Take 1 tablet (25 mg total) by mouth daily., Disp: 90 tablet, Rfl: 1 .  Turmeric Curcumin 500 MG CAPS, Take 1 capsule by mouth daily., Disp: , Rfl:  .  cholestyramine (QUESTRAN) 4 g packet, Take 1 packet (4 g total) by mouth 2 (two) times daily., Disp: 60 each, Rfl: 12 .  ferrous gluconate (FERGON) 225 (27 Fe) MG tablet, Take 1 tablet (240 mg total) by mouth 2 (two) times daily with a meal., Disp: 180 tablet, Rfl: 0  Allergies  Allergen Reactions  . Percocet [Oxycodone-Acetaminophen] Anaphylaxis    ROS    No other specific complaints in a complete review of systems (except as listed in HPI above).  Objective  Vitals:   09/24/18 1340  BP: 124/70  Pulse: 96  Resp: 14  Temp: 98.1 F (36.7 C)  TempSrc: Oral  SpO2: 98%  Weight: 219 lb 11.2 oz (99.7 kg)  Height: 5\' 2"  (1.575 m)     Body mass index is 40.18 kg/m.  Nursing Note and Vital Signs reviewed.  Physical Exam Constitutional:      Appearance: Normal appearance.  HENT:     Head: Normocephalic and atraumatic.  Eyes:     Conjunctiva/sclera: Conjunctivae normal.  Cardiovascular:     Rate and Rhythm: Normal rate.  Pulmonary:     Effort: Pulmonary effort is normal.  Skin:    General: Skin is warm and dry.     Comments: Varicose veins noted to right upper calf and left lower thigh  Neurological:     General: No focal deficit present.     Mental Status: She is alert and oriented to person, place, and time.  Psychiatric:        Mood and Affect: Mood normal.        Behavior: Behavior normal.       No results found for this or any previous visit (from the past 48 hour(s)).  Assessment & Plan  1. Hot flashes - cloNIDine (CATAPRES) 0.1 MG tablet; Take 0.5 tablets (0.05 mg total) by mouth 2 (two) times daily.  Dispense: 30 tablet; Refill: 1  2. Varicose veins of both lower extremities with pain - Ambulatory referral to Vascular  Surgery  3. Insomnia, unspecified type Discussed sleep hygiene  - Melatonin 3 MG TABS; Take 1 tablet 1-2 hours before bedtime, can take up to 6mg  a night if needed.  Dispense: 60 tablet; Refill: 3  4. Hypertension goal BP (blood pressure) < 140/90 Stop HCTZ

## 2018-09-30 ENCOUNTER — Telehealth: Payer: Self-pay

## 2018-09-30 NOTE — Telephone Encounter (Signed)
Spoke with pt and informed her of lab results and Dr. Georgeann Oppenheim instructions to stop taking the iron supplements and repeat the labs in 3 months. Pt agrees.

## 2018-09-30 NOTE — Telephone Encounter (Signed)
-----   Message from Jonathon Bellows, MD sent at 09/29/2018  8:43 AM EDT ----- Regarding: please arrange appointment  Wendy Kent  Please inform iron studioes and CBC stable- stop iron and repeat in 3 months   Regards    Dr Jonathon Bellows  Gastroenterology/Hepatology Pager: [phone]

## 2018-10-14 ENCOUNTER — Other Ambulatory Visit: Payer: Self-pay

## 2018-10-14 ENCOUNTER — Ambulatory Visit (INDEPENDENT_AMBULATORY_CARE_PROVIDER_SITE_OTHER): Payer: BC Managed Care – PPO | Admitting: Vascular Surgery

## 2018-10-14 ENCOUNTER — Encounter (INDEPENDENT_AMBULATORY_CARE_PROVIDER_SITE_OTHER): Payer: Self-pay | Admitting: Vascular Surgery

## 2018-10-14 VITALS — BP 138/91 | HR 103 | Resp 12 | Ht 62.0 in | Wt 224.0 lb

## 2018-10-14 DIAGNOSIS — I83813 Varicose veins of bilateral lower extremities with pain: Secondary | ICD-10-CM | POA: Diagnosis not present

## 2018-10-14 DIAGNOSIS — I10 Essential (primary) hypertension: Secondary | ICD-10-CM | POA: Diagnosis not present

## 2018-10-14 DIAGNOSIS — Z79899 Other long term (current) drug therapy: Secondary | ICD-10-CM

## 2018-10-14 NOTE — Progress Notes (Signed)
Patient ID: Wendy Kent, female   DOB: 1971-02-20, 48 y.o.   MRN: [id number]  Chief Complaint  Patient presents with  . New Patient (Initial Visit)    HPI Wendy Kent is a 48 y.o. female.  I am asked to see the patient by Edgardo Roys, NP for evaluation of painful varicose veins.  The patient presents with complaints of symptomatic varicosities of the legs. The patient reports a long standing history of varicosities and they have become painful over time. There was no clear inciting event or causative factor that started the symptoms.  The left leg is more severly affected. The patient elevates the legs for relief. The pain is described as stinging, burning, and itching. The symptoms are generally most severe in the evening, particularly when they have been on their feet for long periods of time. Elevation as well as anti-inflammatory medication has been used to try to improve the symptoms with limited success. The patient complains of worsening swelling as an associated symptom. The patient has no previous history of deep venous thrombosis or superficial thrombophlebitis to their knowledge.     Past Medical History:  Diagnosis Date  . Allergy   . Blood transfusion without reported diagnosis   . BP (high blood pressure) 12/29/2012  . Breast hypertrophy 11/10/2014  . GERD (gastroesophageal reflux disease)   . Thyroid disease    patient was cleared by specialist. History of Thyroid nodules.    Past Surgical History:  Procedure Laterality Date  . ABDOMINAL HYSTERECTOMY     patient still has ovaries  . BREAST REDUCTION SURGERY Bilateral 08/05/2017   Procedure: BREAST REDUCTION WITH LIPOSUCTION;  Surgeon: Cristine Polio, MD;  Location: [place];  Service: Plastics;  Laterality: Bilateral;  . CHOLECYSTECTOMY    . COLONOSCOPY WITH PROPOFOL N/A 03/26/2018   Procedure: COLONOSCOPY WITH PROPOFOL;  Surgeon: Jonathon Bellows, MD;  Location: [facility] ENDOSCOPY;  Service:  Gastroenterology;  Laterality: N/A;  . ESOPHAGOGASTRODUODENOSCOPY (EGD) WITH PROPOFOL N/A 03/26/2018   Procedure: ESOPHAGOGASTRODUODENOSCOPY (EGD) WITH PROPOFOL;  Surgeon: Jonathon Bellows, MD;  Location: [facility] ENDOSCOPY;  Service: Gastroenterology;  Laterality: N/A;  . GIVENS CAPSULE STUDY N/A 04/14/2018   Procedure: GIVENS CAPSULE STUDY;  Surgeon: Jonathon Bellows, MD;  Location: [facility] ENDOSCOPY;  Service: Gastroenterology;  Laterality: N/A;  . SHOULDER ARTHROSCOPY Right   . TUBAL LIGATION      Family History Mother had some varicose veins but not severe No history of bleeding disorders, clotting disorders, or DVT  Social History Social History   Tobacco Use  . Smoking status: Never Smoker  . Smokeless tobacco: Never Used  Substance Use Topics  . Alcohol use: Yes    Alcohol/week: 0.0 standard drinks    Comment: socially  . Drug use: No     Allergies  Allergen Reactions  . Percocet [Oxycodone-Acetaminophen] Anaphylaxis    Current Outpatient Medications  Medication Sig Dispense Refill  . cloNIDine (CATAPRES) 0.1 MG tablet Take 0.5 tablets (0.05 mg total) by mouth 2 (two) times daily. 30 tablet 1  . fluticasone (FLONASE) 50 MCG/ACT nasal spray Place 2 sprays into both nostrils daily as needed. 48 g 3  . Turmeric Curcumin 500 MG CAPS Take 1 capsule by mouth daily.    . Melatonin 3 MG TABS Take 1 tablet 1-2 hours before bedtime, can take up to 6mg  a night if needed. (Patient not taking: Reported on 10/14/2018) 60 tablet 3   No current facility-administered medications for this visit.  REVIEW OF SYSTEMS (Negative unless checked)  Constitutional: [] Weight loss  [] Fever  [] Chills Cardiac: [] Chest pain   [] Chest pressure   [] Palpitations   [] Shortness of breath when laying flat   [] Shortness of breath at rest   [] Shortness of breath with exertion. Vascular:  [] Pain in legs with walking   [] Pain in legs at rest   [] Pain in legs when laying flat   [] Claudication   [] Pain in feet when  walking  [] Pain in feet at rest  [] Pain in feet when laying flat   [] History of DVT   [] Phlebitis   [x] Swelling in legs   [x] Varicose veins   [] Non-healing ulcers Pulmonary:   [] Uses home oxygen   [] Productive cough   [] Hemoptysis   [] Wheeze  [] COPD   [] Asthma Neurologic:  [] Dizziness  [] Blackouts   [] Seizures   [] History of stroke   [] History of TIA  [] Aphasia   [] Temporary blindness   [] Dysphagia   [] Weakness or numbness in arms   [] Weakness or numbness in legs Musculoskeletal:  [] Arthritis   [] Joint swelling   [] Joint pain   [] Low back pain Hematologic:  [] Easy bruising  [] Easy bleeding   [] Hypercoagulable state   [x] Anemic  [] Hepatitis Gastrointestinal:  [] Blood in stool   [] Vomiting blood  [x] Gastroesophageal reflux/heartburn   [] Abdominal pain Genitourinary:  [] Chronic kidney disease   [] Difficult urination  [] Frequent urination  [] Burning with urination   [] Hematuria Skin:  [] Rashes   [] Ulcers   [] Wounds Psychological:  [] History of anxiety   []  History of major depression.    Physical Exam BP (!) 138/91 (BP Location: Left Arm, Patient Position: Sitting, Cuff Size: Normal)   Pulse (!) 103   Resp 12   Ht 5\' 2"  (1.575 m)   Wt 224 lb (101.6 kg)   BMI 40.97 kg/m  Gen:  WD/WN, NAD Head: [place]/AT, No temporalis wasting.  Ear/Nose/Throat: Hearing grossly intact, dentition good Eyes: Sclera non-icteric. Conjunctiva clear Neck: Supple. Trachea midline Pulmonary:  Good air movement, no use of accessory muscles, respirations not labored.  Cardiac: RRR, No JVD Vascular: Varicosities diffuse and measuring up to 2 mm in the right lower extremity        Varicosities diffuse and measuring up to 2-3 mm in the left lower extremity Vessel Right Left  Radial Palpable Palpable                          PT Palpable Palpable  DP Palpable Palpable    Musculoskeletal: M/S 5/5 throughout. Trace BLE edema Neurologic: Sensation grossly intact in extremities.  Symmetrical.  Speech is fluent.    Psychiatric: Judgment intact, Mood & affect appropriate for pt's clinical situation. Dermatologic: No rashes or ulcers noted.  No cellulitis or open wounds.    Radiology No results found.  Labs Recent Results (from the past 2160 hour(s))  CBC with Differential/Platelet     Status: None   Collection Time: 08/25/18  2:01 PM  Result Value Ref Range   WBC 8.6 3.4 - 10.8 x10E3/uL   RBC 4.18 3.77 - 5.28 x10E6/uL   Hemoglobin 12.0 11.1 - 15.9 g/dL   Hematocrit 34.9 34.0 - 46.6 %   MCV 84 79 - 97 fL   MCH 28.7 26.6 - 33.0 pg   MCHC 34.4 31.5 - 35.7 g/dL   RDW 14.9 11.7 - 15.4 %   Platelets 351 150 - 450 x10E3/uL   Neutrophils 63 Not Estab. %   Lymphs 27 Not Estab. %  Monocytes 7 Not Estab. %   Eos 2 Not Estab. %   Basos 1 Not Estab. %   Neutrophils Absolute 5.5 1.4 - 7.0 x10E3/uL   Lymphocytes Absolute 2.3 0.7 - 3.1 x10E3/uL   Monocytes Absolute 0.6 0.1 - 0.9 x10E3/uL   EOS (ABSOLUTE) 0.1 0.0 - 0.4 x10E3/uL   Basophils Absolute 0.0 0.0 - 0.2 x10E3/uL   Immature Granulocytes 0 Not Estab. %   Immature Grans (Abs) 0.0 0.0 - 0.1 x10E3/uL  Fe+TIBC+Fer     Status: None   Collection Time: 08/25/18  2:01 PM  Result Value Ref Range   Total Iron Binding Capacity 318 250 - 450 ug/dL   UIBC 247 131 - 425 ug/dL   Iron 71 27 - 159 ug/dL   Iron Saturation 22 15 - 55 %   Ferritin 35 15 - 150 ng/mL    Assessment/Plan:  Hypertension goal BP (blood pressure) < 140/90 blood pressure control important in reducing the progression of atherosclerotic disease. On appropriate oral medications.   Varicose veins of leg with pain, bilateral   The patient has symptoms consistent with chronic venous insufficiency. We discussed the natural history and treatment options for venous disease. I recommended the regular use of 20 - 30 mm Hg compression stockings, and prescribed these today. I recommended leg elevation and anti-inflammatories as needed for pain. I have also recommended a complete venous  duplex to assess the venous system for reflux or thrombotic issues. This can be done at the patient's convenience. I will see the patient back in 3 months to assess the response to conservative management, and determine further treatment options.     Leotis Pain 10/14/2018, 4:54 PM   This note was created with Dragon medical transcription system.  Any errors from dictation are unintentional.

## 2018-10-14 NOTE — Patient Instructions (Signed)
Varicose Veins Varicose veins are veins that have become enlarged, bulged, and twisted. They most often appear in the legs. What are the causes? This condition is caused by damage to the valves in the vein. These valves help blood return to your heart. When they are damaged and they stop working properly, blood may flow backward and back up in the veins near the skin, causing the veins to get larger and appear twisted. The condition can result from any issue that causes blood to back up, like pregnancy, prolonged standing, or obesity. What increases the risk? This condition is more likely to develop in people who are:  On their feet a lot.  Pregnant.  Overweight. What are the signs or symptoms? Symptoms of this condition include:  Bulging, twisted, and bluish veins.  A feeling of heaviness. This may be worse at the end of the day.  Leg pain. This may be worse at the end of the day.  Swelling in the leg.  Changes in skin color over the veins. How is this diagnosed? This condition may be diagnosed based on your symptoms, a physical exam, and an ultrasound test. How is this treated? Treatment for this condition may involve:  Avoiding sitting or standing in one position for long periods of time.  Wearing compression stockings. These stockings help to prevent blood clots and reduce swelling in the legs.  Raising (elevating) the legs when resting.  Losing weight.  Exercising regularly. If you have persistent symptoms or want to improve the way your varicose veins look, you may choose to have a procedure to close the varicose veins off or to remove them. Treatments to close off the veins include:  Sclerotherapy. In this treatment, a solution is injected into a vein to close it off.  Laser treatment. In this treatment, the vein is heated with a laser to close it off.  Radiofrequency vein ablation. In this treatment, an electrical current produced by radio waves is used to close  off the vein. Treatments to remove the veins include:  Phlebectomy. In this treatment, the veins are removed through small incisions made over the veins.  Vein ligation and stripping. In this treatment, incisions are made over the veins. The veins are then removed after being tied (ligated) with stitches (sutures). Follow these instructions at home: Activity  Walk as much as possible. Walking increases blood flow. This helps blood return to the heart and takes pressure off your veins. It also increases your cardiovascular strength.  Follow your health care provider's instructions about exercising.  Do not stand or sit in one position for a long period of time.  Do not sit with your legs crossed.  Rest with your legs raised during the day. General instructions   Follow any diet instructions given to you by your health care provider.  Wear compression stockings as directed by your health care provider. Do not wear other kinds of tight clothing around your legs, pelvis, or waist.  Elevate your legs at night to above the level of your heart.  If you get a cut in the skin over the varicose vein and the vein bleeds: ? Lie down with your leg raised. ? Apply firm pressure to the cut with a clean cloth until the bleeding stops. ? Place a bandage (dressing) on the cut. Contact a health care provider if:  The skin around your varicose veins starts to break down.  You have pain, redness, tenderness, or hard swelling over a vein.  You   are uncomfortable because of pain.  You get a cut in the skin over a varicose vein and it will not stop bleeding. Summary  Varicose veins are veins that have become enlarged, bulged, and twisted. They most often appear in the legs.  This condition is caused by damage to the valves in the vein. These valves help blood return to your heart.  Treatment for this condition includes frequent movements, wearing compression stockings, losing weight, and  exercising regularly. In some cases, procedures are done to close off or remove the veins.  Treatment for this condition may include wearing compression stockings, elevating the legs, losing weight, and engaging in regular activity. In some cases, procedures are done to close off or remove the veins. This information is not intended to replace advice given to you by your health care provider. Make sure you discuss any questions you have with your health care provider. Document Released: 01/17/2005 Document Revised: 05/02/2016 Document Reviewed: 05/02/2016 Elsevier Interactive Patient Education  2019 [employer]  

## 2018-10-14 NOTE — Assessment & Plan Note (Signed)
blood pressure control important in reducing the progression of atherosclerotic disease. On appropriate oral medications.  

## 2018-10-17 ENCOUNTER — Other Ambulatory Visit: Payer: Self-pay | Admitting: Nurse Practitioner

## 2018-10-17 DIAGNOSIS — R232 Flushing: Secondary | ICD-10-CM

## 2018-11-01 ENCOUNTER — Other Ambulatory Visit: Payer: Self-pay | Admitting: Family Medicine

## 2018-11-01 DIAGNOSIS — J302 Other seasonal allergic rhinitis: Secondary | ICD-10-CM

## 2018-11-20 ENCOUNTER — Other Ambulatory Visit: Payer: Self-pay | Admitting: Nurse Practitioner

## 2018-11-20 DIAGNOSIS — R232 Flushing: Secondary | ICD-10-CM

## 2018-11-25 ENCOUNTER — Other Ambulatory Visit: Payer: Self-pay | Admitting: Family Medicine

## 2018-11-25 DIAGNOSIS — I10 Essential (primary) hypertension: Secondary | ICD-10-CM

## 2018-12-01 ENCOUNTER — Ambulatory Visit: Payer: BLUE CROSS/BLUE SHIELD | Admitting: Nurse Practitioner

## 2018-12-01 ENCOUNTER — Ambulatory Visit (INDEPENDENT_AMBULATORY_CARE_PROVIDER_SITE_OTHER): Payer: BC Managed Care – PPO | Admitting: Nurse Practitioner

## 2018-12-01 ENCOUNTER — Other Ambulatory Visit: Payer: Self-pay

## 2018-12-01 ENCOUNTER — Encounter: Payer: Self-pay | Admitting: Nurse Practitioner

## 2018-12-01 DIAGNOSIS — R5383 Other fatigue: Secondary | ICD-10-CM

## 2018-12-01 DIAGNOSIS — R351 Nocturia: Secondary | ICD-10-CM

## 2018-12-01 DIAGNOSIS — I10 Essential (primary) hypertension: Secondary | ICD-10-CM

## 2018-12-01 DIAGNOSIS — R0683 Snoring: Secondary | ICD-10-CM

## 2018-12-01 DIAGNOSIS — D509 Iron deficiency anemia, unspecified: Secondary | ICD-10-CM

## 2018-12-01 DIAGNOSIS — R232 Flushing: Secondary | ICD-10-CM

## 2018-12-01 DIAGNOSIS — E0590 Thyrotoxicosis, unspecified without thyrotoxic crisis or storm: Secondary | ICD-10-CM

## 2018-12-01 LAB — CBC WITH DIFFERENTIAL/PLATELET
Absolute Monocytes: 564 cells/uL (ref 200–950)
Basophils Absolute: 40 cells/uL (ref 0–200)
Basophils Relative: 0.4 %
Eosinophils Absolute: 119 cells/uL (ref 15–500)
Eosinophils Relative: 1.2 %
HCT: 38.1 % (ref 35.0–45.0)
Hemoglobin: 12.5 g/dL (ref 11.7–15.5)
Lymphs Abs: 2376 cells/uL (ref 850–3900)
MCH: 28.2 pg (ref 27.0–33.0)
MCHC: 32.8 g/dL (ref 32.0–36.0)
MCV: 86 fL (ref 80.0–100.0)
MPV: 10.4 fL (ref 7.5–12.5)
Monocytes Relative: 5.7 %
Neutro Abs: 6801 cells/uL (ref 1500–7800)
Neutrophils Relative %: 68.7 %
Platelets: 364 10*3/uL (ref 140–400)
RBC: 4.43 10*6/uL (ref 3.80–5.10)
RDW: 14.5 % (ref 11.0–15.0)
Total Lymphocyte: 24 %
WBC: 9.9 10*3/uL (ref 3.8–10.8)

## 2018-12-01 LAB — TSH: TSH: 0.18 mIU/L — ABNORMAL LOW

## 2018-12-01 MED ORDER — CLONIDINE HCL 0.1 MG PO TABS: 0.1000 mg | ORAL_TABLET | Freq: Every day | ORAL | 1 refills | Status: DC

## 2018-12-01 NOTE — Patient Instructions (Addendum)
Mediterranean Diet A Mediterranean diet refers to food and lifestyle choices that are based on the traditions of countries located on the [employer]. This way of eating has been shown to help prevent certain conditions and improve outcomes for people who have chronic diseases, like kidney disease and heart disease. What are tips for following this plan? Lifestyle  Cook and eat meals together with your family, when possible.  Drink enough fluid to keep your urine clear or pale yellow.  Be physically active every day. This includes: ? Aerobic exercise like running or swimming. ? Leisure activities like gardening, walking, or housework.  Get 7-8 hours of sleep each night.  If recommended by your health care provider, drink red wine in moderation. This means 1 glass a day for nonpregnant women and 2 glasses a day for men. A glass of wine equals 5 oz (150 mL). Reading food labels   Check the serving size of packaged foods. For foods such as rice and pasta, the serving size refers to the amount of cooked product, not dry.  Check the total fat in packaged foods. Avoid foods that have saturated fat or trans fats.  Check the ingredients list for added sugars, such as corn syrup. Shopping  At the grocery store, buy most of your food from the areas near the walls of the store. This includes: ? Fresh fruits and vegetables (produce). ? Grains, beans, nuts, and seeds. Some of these may be available in unpackaged forms or large amounts (in bulk). ? Fresh seafood. ? Poultry and eggs. ? Low-fat dairy products.  Buy whole ingredients instead of prepackaged foods.  Buy fresh fruits and vegetables in-season from local farmers markets.  Buy frozen fruits and vegetables in resealable bags.  If you do not have access to quality fresh seafood, buy precooked frozen shrimp or canned fish, such as tuna, salmon, or sardines.  Buy small amounts of raw or cooked vegetables, salads, or olives from  the deli or salad bar at your store.  Stock your pantry so you always have certain foods on hand, such as olive oil, canned tuna, canned tomatoes, rice, pasta, and beans. Cooking  Cook foods with extra-virgin olive oil instead of using butter or other vegetable oils.  Have meat as a side dish, and have vegetables or grains as your main dish. This means having meat in small portions or adding small amounts of meat to foods like pasta or stew.  Use beans or vegetables instead of meat in common dishes like chili or lasagna.  Experiment with different cooking methods. Try roasting or broiling vegetables instead of steaming or sauteing them.  Add frozen vegetables to soups, stews, pasta, or rice.  Add nuts or seeds for added healthy fat at each meal. You can add these to yogurt, salads, or vegetable dishes.  Marinate fish or vegetables using olive oil, lemon juice, garlic, and fresh herbs. Meal planning   Plan to eat 1 vegetarian meal one day each week. Try to work up to 2 vegetarian meals, if possible.  Eat seafood 2 or more times a week.  Have healthy snacks readily available, such as: ? Vegetable sticks with hummus. ? [place] yogurt. ? Fruit and nut trail mix.  Eat balanced meals throughout the week. This includes: ? Fruit: 2-3 servings a day ? Vegetables: 4-5 servings a day ? Low-fat dairy: 2 servings a day ? Fish, poultry, or lean meat: 1 serving a day ? Beans and legumes: 2 or more servings a week ?  Nuts and seeds: 1-2 servings a day ? Whole grains: 6-8 servings a day ? Extra-virgin olive oil: 3-4 servings a day  Limit red meat and sweets to only a few servings a month What are my food choices?  Mediterranean diet ? Recommended  Grains: Whole-grain pasta. Brown rice. Bulgar wheat. Polenta. Couscous. Whole-wheat bread. [place].  Vegetables: Artichokes. Beets. Broccoli. Cabbage. Carrots. Eggplant. Green beans. Chard. Kale. Spinach. Onions. Leeks. Peas. Squash.  Tomatoes. Peppers. Radishes.  Fruits: Apples. Apricots. Avocado. Berries. Bananas. Cherries. Dates. Figs. Grapes. Lemons. Melon. Oranges. Peaches. Plums. Pomegranate.  Meats and other protein foods: Beans. Almonds. Sunflower seeds. Pine nuts. Peanuts. Aromas. Salmon. Scallops. Shrimp. [place]. Tilapia. Clams. Oysters. Eggs.  Dairy: Low-fat milk. Cheese. Greek yogurt.  Beverages: Water. Red wine. Herbal tea.  Fats and oils: Extra virgin olive oil. Avocado oil. Grape seed oil.  Sweets and desserts: [place] yogurt with honey. Baked apples. Poached pears. Trail mix.  Seasoning and other foods: Basil. Cilantro. Coriander. Cumin. Mint. Parsley. Sage. Rosemary. Tarragon. Garlic. Oregano. Thyme. Pepper. Balsalmic vinegar. Tahini. Hummus. Tomato sauce. Olives. Mushrooms. ? Limit these  Grains: Prepackaged pasta or rice dishes. Prepackaged cereal with added sugar.  Vegetables: Deep fried potatoes (french fries).  Fruits: Fruit canned in syrup.  Meats and other protein foods: Beef. Pork. Lamb. Poultry with skin. Hot dogs. [place] Salines.  Dairy: Ice cream. Sour cream. Whole milk.  Beverages: Juice. Sugar-sweetened soft drinks. Beer. Liquor and spirits.  Fats and oils: Butter. Canola oil. Vegetable oil. Beef fat (tallow). Lard.  Sweets and desserts: Cookies. Cakes. Pies. Candy.  Seasoning and other foods: Mayonnaise. Premade sauces and marinades. The items listed may not be a complete list. Talk with your dietitian about what dietary choices are right for you. Summary  The Mediterranean diet includes both food and lifestyle choices.  Eat a variety of fresh fruits and vegetables, beans, nuts, seeds, and whole grains.  Limit the amount of red meat and sweets that you eat.  Talk with your health care provider about whether it is safe for you to drink red wine in moderation. This means 1 glass a day for nonpregnant women and 2 glasses a day for men. A glass of wine equals 5 oz (150 mL). This information  is not intended to replace advice given to you by your health care provider. Make sure you discuss any questions you have with your health care provider. Document Released: 12/01/2015 Document Revised: 12/08/2015 Document Reviewed: 12/01/2015 Elsevier Patient Education  2020 [place] for [employer] Calories are units of energy. Your body needs a certain amount of calories from food to keep you going throughout the day. When you eat more calories than your body needs, your body stores the extra calories as fat. When you eat fewer calories than your body needs, your body burns fat to get the energy it needs. Calorie counting means keeping track of how many calories you eat and drink each day. Calorie counting can be helpful if you need to lose weight. If you make sure to eat fewer calories than your body needs, you should lose weight. Ask your health care provider what a healthy weight is for you. For calorie counting to work, you will need to eat the right number of calories in a day in order to lose a healthy amount of weight per week. A dietitian can help you determine how many calories you need in a day and will give you suggestions on how to reach your  calorie goal.  A healthy amount of weight to lose per week is usually 1-2 lb (0.5-0.9 kg). This usually means that your daily calorie intake should be reduced by 500-750 calories.  Eating 1,200 - 1,500 calories per day can help most women lose weight.  Eating 1,500 - 1,800 calories per day can help most men lose weight. What is my plan? My goal is to have __________ calories per day. If I have this many calories per day, I should lose around __________ pounds per week. What do I need to know about calorie counting? In order to meet your daily calorie goal, you will need to:  Find out how many calories are in each food you would like to eat. Try to do this before you eat.  Decide how much of the food you plan to  eat.  Write down what you ate and how many calories it had. Doing this is called keeping a food log. To successfully lose weight, it is important to balance calorie counting with a healthy lifestyle that includes regular activity. Aim for 150 minutes of moderate exercise (such as walking) or 75 minutes of vigorous exercise (such as running) each week. Where do I find calorie information?  The number of calories in a food can be found on a Nutrition Facts label. If a food does not have a Nutrition Facts label, try to look up the calories online or ask your dietitian for help. Remember that calories are listed per serving. If you choose to have more than one serving of a food, you will have to multiply the calories per serving by the amount of servings you plan to eat. For example, the label on a package of bread might say that a serving size is 1 slice and that there are 90 calories in a serving. If you eat 1 slice, you will have eaten 90 calories. If you eat 2 slices, you will have eaten 180 calories. How do I keep a food log? Immediately after each meal, record the following information in your food log:  What you ate. Don't forget to include toppings, sauces, and other extras on the food.  How much you ate. This can be measured in cups, ounces, or number of items.  How many calories each food and drink had.  The total number of calories in the meal. Keep your food log near you, such as in a small notebook in your pocket, or use a mobile app or website. Some programs will calculate calories for you and show you how many calories you have left for the day to meet your goal. What are some calorie counting tips?  Use your calories on foods and drinks that will fill you up and not leave you hungry: ? Some examples of foods that fill you up are nuts and nut butters, vegetables, lean proteins, and high-fiber foods like whole grains. High-fiber foods are foods with more than 5 g fiber per  serving. ? Drinks such as sodas, specialty coffee drinks, alcohol, and juices have a lot of calories, yet do not fill you up.  Eat nutritious foods and avoid empty calories. Empty calories are calories you get from foods or beverages that do not have many vitamins or protein, such as candy, sweets, and soda. It is better to have a nutritious high-calorie food (such as an avocado) than a food with few nutrients (such as a bag of chips).  Know how many calories are in the foods you eat most  often. This will help you calculate calorie counts faster.  Pay attention to calories in drinks. Low-calorie drinks include water and unsweetened drinks.  Pay attention to nutrition labels for "low fat" or "fat free" foods. These foods sometimes have the same amount of calories or more calories than the full fat versions. They also often have added sugar, starch, or salt, to make up for flavor that was removed with the fat.  Find a way of tracking calories that works for you. Get creative. Try different apps or programs if writing down calories does not work for you. What are some portion control tips?  Know how many calories are in a serving. This will help you know how many servings of a certain food you can have.  Use a measuring cup to measure serving sizes. You could also try weighing out portions on a kitchen scale. With time, you will be able to estimate serving sizes for some foods.  Take some time to put servings of different foods on your favorite plates, bowls, and cups so you know what a serving looks like.  Try not to eat straight from a bag or box. Doing this can lead to overeating. Put the amount you would like to eat in a cup or on a plate to make sure you are eating the right portion.  Use smaller plates, glasses, and bowls to prevent overeating.  Try not to multitask (for example, watch TV or use your computer) while eating. If it is time to eat, sit down at a table and enjoy your food.  This will help you to know when you are full. It will also help you to be aware of what you are eating and how much you are eating. What are tips for following this plan? Reading food labels  Check the calorie count compared to the serving size. The serving size may be smaller than what you are used to eating.  Check the source of the calories. Make sure the food you are eating is high in vitamins and protein and low in saturated and trans fats. Shopping  Read nutrition labels while you shop. This will help you make healthy decisions before you decide to purchase your food.  Make a grocery list and stick to it. Cooking  Try to cook your favorite foods in a healthier way. For example, try baking instead of frying.  Use low-fat dairy products. Meal planning  Use more fruits and vegetables. Half of your plate should be fruits and vegetables.  Include lean proteins like poultry and fish. How do I count calories when eating out?  Ask for smaller portion sizes.  Consider sharing an entree and sides instead of getting your own entree.  If you get your own entree, eat only half. Ask for a box at the beginning of your meal and put the rest of your entree in it so you are not tempted to eat it.  If calories are listed on the menu, choose the lower calorie options.  Choose dishes that include vegetables, fruits, whole grains, low-fat dairy products, and lean protein.  Choose items that are boiled, broiled, grilled, or steamed. Stay away from items that are buttered, battered, fried, or served with cream sauce. Items labeled "crispy" are usually fried, unless stated otherwise.  Choose water, low-fat milk, unsweetened iced tea, or other drinks without added sugar. If you want an alcoholic beverage, choose a lower calorie option such as a glass of wine or light beer.  Ask  for dressings, sauces, and syrups on the side. These are usually high in calories, so you should limit the amount you  eat.  If you want a salad, choose a garden salad and ask for grilled meats. Avoid extra toppings like bacon, cheese, or fried items. Ask for the dressing on the side, or ask for olive oil and vinegar or lemon to use as dressing.  Estimate how many servings of a food you are given. For example, a serving of cooked rice is  cup or about the size of half a baseball. Knowing serving sizes will help you be aware of how much food you are eating at restaurants. The list below tells you how big or small some common portion sizes are based on everyday objects: ? 1 oz--4 stacked dice. ? 3 oz--1 deck of cards. ? 1 tsp--1 die. ? 1 Tbsp-- a ping-pong ball. ? 2 Tbsp--1 ping-pong ball. ?  cup-- baseball. ? 1 cup--1 baseball. Summary  Calorie counting means keeping track of how many calories you eat and drink each day. If you eat fewer calories than your body needs, you should lose weight.  A healthy amount of weight to lose per week is usually 1-2 lb (0.5-0.9 kg). This usually means reducing your daily calorie intake by 500-750 calories.  The number of calories in a food can be found on a Nutrition Facts label. If a food does not have a Nutrition Facts label, try to look up the calories online or ask your dietitian for help.  Use your calories on foods and drinks that will fill you up, and not on foods and drinks that will leave you hungry.  Use smaller plates, glasses, and bowls to prevent overeating. This information is not intended to replace advice given to you by your health care provider. Make sure you discuss any questions you have with your health care provider. Document Released: 04/09/2005 Document Revised: 12/27/2017 Document Reviewed: 03/09/2016 Elsevier Patient Education  2020 [employer].

## 2018-12-01 NOTE — Progress Notes (Signed)
Name: Wendy Kent   MRN: [id number]    DOB: [birth year]   Date:12/01/2018       Progress Note  Subjective  Chief Complaint  Chief Complaint  Patient presents with  . Follow-up    HPI  Morbid obesity Patient has been walking more and watching portions and drinking more water.  Breakfast: chicfila breakfast bowl and parfait OR eats an apple and plum, or eats sausage and chips Lunch: skips lunch a lot, occasionally will eat chicfila regular nuggets, fruit and sweet tea/lemonade Dinner: sometimes skips but usually eats whatever significant other wants to eat- usually cookout or something else out shrimp scampi and caesar salad  Drink: coffee- coldstone creamer; water-2-4 bottles a day.  Wt Readings from Last 3 Encounters:  12/01/18 225 lb 8 oz (102.3 kg)  10/14/18 224 lb (101.6 kg)  09/24/18 219 lb 11.2 oz (99.7 kg)  01/31/2018 213lbs   Hot flashes Has been taking 0.1mg  at bedtime, with improvement of hot flashes  Snoring Husband states he snores, states feels fatigued in the day time, endorses nocturia  BP Readings from Last 3 Encounters:  12/01/18 116/72  10/14/18 (!) 138/91  09/24/18 124/70     PHQ2/9: Depression screen PHQ 2/9 12/01/2018 09/24/2018 01/31/2018 12/13/2017 09/12/2017  Decreased Interest 0 0 0 0 0  Down, Depressed, Hopeless 0 0 0 0 0  PHQ - 2 Score 0 0 0 0 0  Altered sleeping 0 3 - - -  Tired, decreased energy 0 0 - - -  Change in appetite 0 0 - - -  Feeling bad or failure about yourself  0 0 - - -  Trouble concentrating 0 0 - - -  Moving slowly or fidgety/restless 0 0 - - -  Suicidal thoughts 0 0 - - -  PHQ-9 Score 0 3 - - -  Difficult doing work/chores Not difficult at all - - - -     PHQ reviewed. Negative  Patient Active Problem List   Diagnosis Date Noted  . Varicose veins of leg with pain, bilateral 10/14/2018  . Preventative health care 02/03/2018  . Obesity (BMI 35.0-39.9 without comorbidity) 09/12/2017  . Hypokalemia 01/11/2016  .  Hepatomegaly 12/22/2015  . Cardiomegaly 12/22/2015  . Lactose intolerance 09/26/2015  . IBS (irritable bowel syndrome) 09/26/2015  . Multinodular goiter 09/28/2014  . Hyperthyroidism, subclinical 09/28/2014  . Allergic rhinitis, seasonal 09/28/2014  . Iron deficiency anemia 12/29/2012  . Hypertension goal BP (blood pressure) < 140/90 12/29/2012  . Anemia 12/29/2012    Past Medical History:  Diagnosis Date  . Allergy   . Blood transfusion without reported diagnosis   . BP (high blood pressure) 12/29/2012  . Breast hypertrophy 11/10/2014  . GERD (gastroesophageal reflux disease)   . Thyroid disease    patient was cleared by specialist. History of Thyroid nodules.    Past Surgical History:  Procedure Laterality Date  . ABDOMINAL HYSTERECTOMY     patient still has ovaries  . BREAST REDUCTION SURGERY Bilateral 08/05/2017   Procedure: BREAST REDUCTION WITH LIPOSUCTION;  Surgeon: Cristine Polio, MD;  Location: [facility];  Service: Plastics;  Laterality: Bilateral;  . CHOLECYSTECTOMY    . COLONOSCOPY WITH PROPOFOL N/A 03/26/2018   Procedure: COLONOSCOPY WITH PROPOFOL;  Surgeon: Jonathon Bellows, MD;  Location[facility]COPY;  Service: Gastroenterology;  Laterality: N/A;  . ESOPHAGOGASTRODUODENOSCOPY (EGD) WITH PROPOFOL N/A 03/26/2018   Procedure: ESOPHAGOGASTRODUODENOSCOPY (EGD) WITH PROPOFOL;  Surgeon: Jonathon Bellows, MD;  Location: [facility] [place] ENDOSCOPY;  Service: Gastroenterology;  Laterality:  N/A;  . GIVENS CAPSULE STUDY N/A 04/14/2018   Procedure: GIVENS CAPSULE STUDY;  Surgeon: Jonathon Bellows, MD;  Location: [facility] ENDOSCOPY;  Service: Gastroenterology;  Laterality: N/A;  . SHOULDER ARTHROSCOPY Right   . TUBAL LIGATION      Social History   Tobacco Use  . Smoking status: Never Smoker  . Smokeless tobacco: Never Used  Substance Use Topics  . Alcohol use: Yes    Alcohol/week: 0.0 standard drinks    Comment: socially     Current Outpatient Medications:  .  cloNIDine  (CATAPRES) 0.1 MG tablet, TAKE 1/2 TABLETS (0.05 MG TOTAL) BY MOUTH 2 (TWO) TIMES DAILY., Disp: 90 tablet, Rfl: 0 .  fluticasone (FLONASE) 50 MCG/ACT nasal spray, PLACE 2 SPRAYS INTO BOTH NOSTRILS DAILY AS NEEDED., Disp: 48 mL, Rfl: 3 .  Melatonin 3 MG TABS, Take 1 tablet 1-2 hours before bedtime, can take up to 6mg  a night if needed., Disp: 60 tablet, Rfl: 3 .  Turmeric Curcumin 500 MG CAPS, Take 1 capsule by mouth daily., Disp: , Rfl:   Allergies  Allergen Reactions  . Percocet [Oxycodone-Acetaminophen] Anaphylaxis    ROS   No other specific complaints in a complete review of systems (except as listed in HPI above).  Objective  Vitals:   12/01/18 1553  BP: 116/72  Pulse: 92  Resp: 14  Temp: (!) 97.1 F (36.2 C)  TempSrc: Oral  SpO2: 99%  Weight: 225 lb 8 oz (102.3 kg)  Height: 5\' 2"  (1.575 m)    Body mass index is 41.24 kg/m.  Nursing Note and Vital Signs reviewed.  Physical Exam  Constitutional: Patient appears well-developed and well-nourished. Obese  No distress.  HEENT: head atraumatic, normocephalic,neck supple,  Cardiovascular: Normal rate, regular rhythm and normal heart sounds.  No murmur heard. No BLE edema. Pulmonary/Chest: Effort normal and breath sounds normal. No respiratory distress. Abdominal: Soft.  There is no tenderness. Psychiatric: Patient has a normal mood and affect. behavior is normal. Judgment and thought content normal.    No results found for this or any previous visit (from the past 48 hour(s)).  Assessment & Plan  1. Hypertension goal BP (blood pressure) < 140/90 Well controlled today without meds.   2. Hyperthyroidism, subclinical Is having hot flashes, no insomnia, diarrhea, palpitations.  - TSH  3. Iron deficiency anemia, unspecified iron deficiency anemia type - CBC w/Diff/Platelet  4. Hot flashes - cloNIDine (CATAPRES) 0.1 MG tablet; Take 1 tablet (0.1 mg total) by mouth at bedtime.  Dispense: 90 tablet; Refill: 1  5.  Snoring - Ambulatory referral to Sleep Studies  6. Nocturia - Ambulatory referral to Sleep Studies  7. Morbid obesity ([place]) - Amb Ref to Medical Weight Management  8. Other fatigue - Ambulatory referral to Sleep Studies - TSH - CBC w/Diff/Platelet

## 2018-12-02 ENCOUNTER — Other Ambulatory Visit: Payer: Self-pay

## 2018-12-02 ENCOUNTER — Telehealth: Payer: Self-pay

## 2018-12-02 DIAGNOSIS — D509 Iron deficiency anemia, unspecified: Secondary | ICD-10-CM

## 2018-12-02 NOTE — Telephone Encounter (Signed)
-----   Message from Rushie Chestnut, [place] sent at 09/30/2018 11:23 AM EDT ----- Regarding: Pt due for repeat labs Pt is due for repeat iron studies and CBC this month

## 2018-12-02 NOTE — Telephone Encounter (Signed)
Spoke with pt and reminded her that she's due to have repeat Iron studies and CBC lab tests this month. Pt agrees and plans to visit lab soon.

## 2018-12-03 ENCOUNTER — Encounter: Payer: Self-pay | Admitting: Family Medicine

## 2018-12-19 ENCOUNTER — Other Ambulatory Visit: Payer: Self-pay

## 2018-12-19 DIAGNOSIS — G4700 Insomnia, unspecified: Secondary | ICD-10-CM

## 2018-12-19 MED ORDER — MELATONIN 3 MG PO TABS: ORAL_TABLET | ORAL | 3 refills | Status: DC

## 2019-01-02 ENCOUNTER — Ambulatory Visit: Payer: Self-pay | Admitting: Family Medicine

## 2019-01-14 ENCOUNTER — Encounter: Payer: Self-pay | Admitting: Family Medicine

## 2019-01-14 ENCOUNTER — Ambulatory Visit (INDEPENDENT_AMBULATORY_CARE_PROVIDER_SITE_OTHER): Payer: BC Managed Care – PPO | Admitting: Family Medicine

## 2019-01-14 ENCOUNTER — Other Ambulatory Visit: Payer: Self-pay

## 2019-01-14 VITALS — BP 126/84 | HR 94 | Temp 98.9°F | Resp 14 | Ht 62.0 in | Wt 223.5 lb

## 2019-01-14 DIAGNOSIS — I10 Essential (primary) hypertension: Secondary | ICD-10-CM | POA: Diagnosis not present

## 2019-01-14 DIAGNOSIS — R61 Generalized hyperhidrosis: Secondary | ICD-10-CM

## 2019-01-14 DIAGNOSIS — R232 Flushing: Secondary | ICD-10-CM

## 2019-01-14 DIAGNOSIS — E0590 Thyrotoxicosis, unspecified without thyrotoxic crisis or storm: Secondary | ICD-10-CM | POA: Diagnosis not present

## 2019-01-14 DIAGNOSIS — M25511 Pain in right shoulder: Secondary | ICD-10-CM

## 2019-01-14 DIAGNOSIS — E669 Obesity, unspecified: Secondary | ICD-10-CM | POA: Diagnosis not present

## 2019-01-14 DIAGNOSIS — E042 Nontoxic multinodular goiter: Secondary | ICD-10-CM

## 2019-01-14 DIAGNOSIS — R252 Cramp and spasm: Secondary | ICD-10-CM

## 2019-01-14 MED ORDER — MELOXICAM 7.5 MG PO TABS: 7.5000 mg | ORAL_TABLET | Freq: Every day | ORAL | 0 refills | Status: DC

## 2019-01-14 NOTE — Patient Instructions (Signed)
Shoulder Pain Many things can cause shoulder pain, including:  An injury.  Moving the shoulder in the same way again and again (overuse).  Joint pain (arthritis). Pain can come from:  Swelling and irritation (inflammation) of any part of the shoulder.  An injury to the shoulder joint.  An injury to: ? Tissues that connect muscle to bone (tendons). ? Tissues that connect bones to each other (ligaments). ? Bones. Follow these instructions at home: Watch for changes in your symptoms. Let your doctor know about them. Follow these instructions to help with your pain. If you have a sling:  Wear the sling as told by your doctor. Remove it only as told by your doctor.  Loosen the sling if your fingers: ? Tingle. ? Become numb. ? Turn cold and blue.  Keep the sling clean.  If the sling is not waterproof: ? Do not let it get wet. ? Take the sling off when you shower or bathe. Managing pain, stiffness, and swelling   If told, put ice on the painful area: ? Put ice in a plastic bag. ? Place a towel between your skin and the bag. ? Leave the ice on for 20 minutes, 2-3 times a day. Stop putting ice on if it does not help with the pain.  Squeeze a soft ball or a foam pad as much as possible. This prevents swelling in the shoulder. It also helps to strengthen the arm. General instructions  Take over-the-counter and prescription medicines only as told by your doctor.  Keep all follow-up visits as told by your doctor. This is important. Contact a doctor if:  Your pain gets worse.  Medicine does not help your pain.  You have new pain in your arm, hand, or fingers. Get help right away if:  Your arm, hand, or fingers: ? Tingle. ? Are numb. ? Are swollen. ? Are painful. ? Turn white or blue. Summary  Shoulder pain can be caused by many things. These include injury, moving the shoulder in the same away again and again, and joint pain.  Watch for changes in your symptoms.  Let your doctor know about them.  This condition may be treated with a sling, ice, and pain medicine.  Contact your doctor if the pain gets worse or you have new pain. Get help right away if your arm, hand, or fingers tingle or get numb, swollen, or painful.  Keep all follow-up visits as told by your doctor. This is important. This information is not intended to replace advice given to you by your health care provider. Make sure you discuss any questions you have with your health care provider. Document Released: 09/26/2007 Document Revised: 10/22/2017 Document Reviewed: 10/22/2017 Elsevier Patient Education  2020 [employer] Inc[employer]  

## 2019-01-14 NOTE — Progress Notes (Signed)
Name: Wendy Kent   MRN: [id number]    DOB: 23-Nov-1970   Date:01/14/2019       Progress Note  Chief Complaint  Patient presents with  . Follow-up  . Hypertension  . Shoulder Pain    right hx of surgery     Subjective:   Wendy Kent is a 48 y.o. female, presents to clinic for routine follow up on the conditions listed above.  Multiple thyroid nodules hx of hyperthyroid: TSH was low with recent labs pt here for f/up on that.  She has hx of hyperthyroid with multinodular goiter, past biopsy, [place] and endocrinology visits 2014-2015 reviewed from care everywhere.  She denies having any past surgeries or ablation or any past medication to treat thyroid.  She does feel hot at night but she is always felt that way she is having some sweats, hot flashes and palpitations but she is unsure of their perimenopausal symptoms.  She denies any unintentional weight gain or weight loss, constant racing heart or palpitations, diarrhea, denies any peripheral swelling or paresthesias, bloating, constipation, hair or skin changes.  Hypertension:  Currently managed on clonidine twice a day she was taken off hydrochlorothiazide a few months ago she states that that was causing lower extremity swelling and now that she is been on clonidine she feels much better her blood pressure has been controlled today it is at goal Pt reports good med compliance and denies any SE.  No lightheadedness, hypotension, syncope. Pt denies CP, SOB, exertional sx, LE edema, palpitation, Ha's, visual disturbances BP Readings from Last 3 Encounters:  01/14/19 126/84  12/01/18 116/72  10/14/18 (!) 138/91  She has been working on her diet, avoiding salt.  LE edema and varicose veins: She is seeing a specialist, she is wearing compression stockings, overall is improved from past visits.  Much better with improved diet and with exercising.  She is having some cramping that occurs occasionally in her legs  Right shoulder  pain: She reports over the past couple weeks she has had gradual onset of recurrent right shoulder pain about 5 years ago she had a traumatic injury to her right shoulder where her arm was stuck and elevator doors and it was yanked she also had a debridement surgery by an orthopedic surgeon and she had injections she has not seen them over the past 4 to 4-1/2 years.  At that time she was unable to lift her arm up past her shoulder height, she currently has good range of motion but it is achy, does feel very sensitive to temperature changes or even cooler temperatures at night.  She is not taking anything at the moment for it but she would like a referral to a orthopedic surgeon so she can get injections in it again.  She denies any swelling redness, popping instability, any recent strain or injury  Obesity: Wt Readings from Last 5 Encounters:  01/14/19 223 lb 8 oz (101.4 kg)  12/01/18 225 lb 8 oz (102.3 kg)  10/14/18 224 lb (101.6 kg)  09/24/18 219 lb 11.2 oz (99.7 kg)  05/14/18 215 lb 3.2 oz (97.6 kg)   BMI Readings from Last 5 Encounters:  01/14/19 40.88 kg/m  12/01/18 41.24 kg/m  10/14/18 40.97 kg/m  09/24/18 40.18 kg/m  05/14/18 39.36 kg/m  She overall is healthier but over the past several months she is not been able to lose any weight, earlier this year she was 215 she is currently 223.    Patient Active Problem List  Diagnosis Date Noted  . Varicose veins of leg with pain, bilateral 10/14/2018  . Obesity (BMI 35.0-39.9 without comorbidity) 09/12/2017  . Hypokalemia 01/11/2016  . Hepatomegaly 12/22/2015  . Cardiomegaly 12/22/2015  . Lactose intolerance 09/26/2015  . IBS (irritable bowel syndrome) 09/26/2015  . Multinodular goiter 09/28/2014  . Hyperthyroidism, subclinical 09/28/2014  . Allergic rhinitis, seasonal 09/28/2014  . Iron deficiency anemia 12/29/2012  . Hypertension goal BP (blood pressure) < 140/90 12/29/2012  . Anemia 12/29/2012    Past Surgical History:   Procedure Laterality Date  . ABDOMINAL HYSTERECTOMY     patient still has ovaries  . BREAST REDUCTION SURGERY Bilateral 08/05/2017   Procedure: BREAST REDUCTION WITH LIPOSUCTION;  Surgeon: Cristine Polio, MD;  Location: [place];  Service: Plastics;  Laterality: Bilateral;  . CHOLECYSTECTOMY    . COLONOSCOPY WITH PROPOFOL N/A 03/26/2018   Procedure: COLONOSCOPY WITH PROPOFOL;  Surgeon: Jonathon Bellows, MD;  Location: [facility] ENDOSCOPY;  Service: Gastroenterology;  Laterality: N/A;  . ESOPHAGOGASTRODUODENOSCOPY (EGD) WITH PROPOFOL N/A 03/26/2018   Procedure: ESOPHAGOGASTRODUODENOSCOPY (EGD) WITH PROPOFOL;  Surgeon: Jonathon Bellows, MD;  Location: [facility] ENDOSCOPY;  Service: Gastroenterology;  Laterality: N/A;  . GIVENS CAPSULE STUDY N/A 04/14/2018   Procedure: GIVENS CAPSULE STUDY;  Surgeon: Jonathon Bellows, MD;  Location: [facility] ENDOSCOPY;  Service: Gastroenterology;  Laterality: N/A;  . SHOULDER ARTHROSCOPY Right   . TUBAL LIGATION      History reviewed. No pertinent family history.  Social History   Socioeconomic History  . Marital status: Single    Spouse name: Not on file  . Number of children: 4  . Years of education: 45  . Highest education level: Associate degree: academic program  Occupational History  . Occupation: TEFL teacher    Comment: [employer]  Social Needs  . Financial resource strain: Not hard at all  . Food insecurity    Worry: Sometimes true    Inability: Sometimes true  . Transportation needs    Medical: No    Non-medical: No  Tobacco Use  . Smoking status: Never Smoker  . Smokeless tobacco: Never Used  Substance and Sexual Activity  . Alcohol use: Yes    Alcohol/week: 0.0 standard drinks    Comment: socially  . Drug use: No  . Sexual activity: Not Currently    Birth control/protection: Surgical  Lifestyle  . Physical activity    Days per week: 5 days    Minutes per session: 60 min  . Stress: Not at all  Relationships  . Social Product manager on phone: Twice a week    Gets together: Twice a week    Attends religious service: More than 4 times per year    Active member of club or organization: Yes    Attends meetings of clubs or organizations: More than 4 times per year    Relationship status: Divorced  . Intimate partner violence    Fear of current or ex partner: No    Emotionally abused: No    Physically abused: No    Forced sexual activity: No  Other Topics Concern  . Not on file  Social History Narrative  . Not on file     Current Outpatient Medications:  .  cloNIDine (CATAPRES) 0.1 MG tablet, Take 1 tablet (0.1 mg total) by mouth at bedtime., Disp: 90 tablet, Rfl: 1 .  fluticasone (FLONASE) 50 MCG/ACT nasal spray, PLACE 2 SPRAYS INTO BOTH NOSTRILS DAILY AS NEEDED., Disp: 48 mL, Rfl: 3 .  Turmeric Curcumin  500 MG CAPS, Take 1 capsule by mouth daily., Disp: , Rfl:  .  Melatonin 3 MG TABS, Take 1 tablet 1-2 hours before bedtime, can take up to 6mg  a night if needed. (Patient not taking: Reported on 01/14/2019), Disp: 60 tablet, Rfl: 3  Allergies  Allergen Reactions  . Percocet [Oxycodone-Acetaminophen] Anaphylaxis    I personally reviewed active problem list, medication list, allergies, family history, social history, health maintenance, notes from last several encounters, lab results with the patient/caregiver today.  Review of Systems  Constitutional: Negative.   HENT: Negative.   Eyes: Negative.   Respiratory: Negative.   Cardiovascular: Negative.   Gastrointestinal: Negative.   Endocrine: Negative.   Genitourinary: Negative.   Musculoskeletal: Negative.   Skin: Negative.   Allergic/Immunologic: Negative.   Neurological: Negative.   Hematological: Negative.   Psychiatric/Behavioral: Negative.   All other systems reviewed and are negative.    Objective:    Vitals:   01/14/19 0811  BP: 126/84  Pulse: 94  Resp: 14  Temp: 98.9 F (37.2 C)  Weight: 223 lb 8 oz (101.4 kg)  Height: 5\' 2"   (1.575 m)    Body mass index is 40.88 kg/m.  Physical Exam Vitals signs and nursing note reviewed.  Constitutional:      General: She is not in acute distress.    Appearance: Normal appearance. She is well-developed. She is not ill-appearing, toxic-appearing or diaphoretic.     Interventions: Face mask in place.  HENT:     Head: Normocephalic and atraumatic.     Right Ear: External ear normal.     Left Ear: External ear normal.     Nose: Nose normal.     Mouth/Throat:     Mouth: Mucous membranes are moist.  Eyes:     General: Lids are normal. No scleral icterus.       Right eye: No discharge.        Left eye: No discharge.     Conjunctiva/sclera: Conjunctivae normal.  Neck:     Musculoskeletal: Normal range of motion and neck supple.     Thyroid: Thyromegaly (mild) present.     Trachea: Phonation normal. No tracheal deviation.  Cardiovascular:     Rate and Rhythm: Normal rate and regular rhythm.     Pulses: Normal pulses.          Radial pulses are 2+ on the right side and 2+ on the left side.       Posterior tibial pulses are 2+ on the right side and 2+ on the left side.     Heart sounds: Normal heart sounds. No murmur. No friction rub. No gallop.   Pulmonary:     Effort: Pulmonary effort is normal. No respiratory distress.     Breath sounds: Normal breath sounds. No stridor. No wheezing, rhonchi or rales.  Chest:     Chest wall: No tenderness.  Abdominal:     General: Bowel sounds are normal. There is no distension.     Palpations: Abdomen is soft.     Tenderness: There is no abdominal tenderness. There is no guarding or rebound.  Musculoskeletal: Normal range of motion.        General: No deformity.     Right shoulder: Normal. She exhibits normal range of motion, no tenderness, no bony tenderness, no swelling, no effusion, no crepitus, no deformity, no pain, no spasm, normal pulse and normal strength.     Right lower leg: No edema.     Left  lower leg: No edema.    Lymphadenopathy:     Cervical: No cervical adenopathy.  Skin:    General: Skin is warm and dry.     Capillary Refill: Capillary refill takes less than 2 seconds.     Coloration: Skin is not jaundiced or pale.     Findings: No rash.  Neurological:     Mental Status: She is alert and oriented to person, place, and time.     Motor: No abnormal muscle tone.     Gait: Gait normal.  Psychiatric:        Speech: Speech normal.        Behavior: Behavior normal.      Recent Results (from the past 2160 hour(s))  TSH     Status: Abnormal   Collection Time: 12/01/18  4:34 PM  Result Value Ref Range   TSH 0.18 (L) mIU/L    Comment:           Reference Range .           > or = 20 Years  0.40-4.50 .                Pregnancy Ranges           First trimester    0.26-2.66           Second trimester   0.55-2.73           Third trimester    0.43-2.91   CBC w/Diff/Platelet     Status: None   Collection Time: 12/01/18  4:34 PM  Result Value Ref Range   WBC 9.9 3.8 - 10.8 Thousand/uL   RBC 4.43 3.80 - 5.10 Million/uL   Hemoglobin 12.5 11.7 - 15.5 g/dL   HCT 38.1 35.0 - 45.0 %   MCV 86.0 80.0 - 100.0 fL   MCH 28.2 27.0 - 33.0 pg   MCHC 32.8 32.0 - 36.0 g/dL   RDW 14.5 11.0 - 15.0 %   Platelets 364 140 - 400 Thousand/uL   MPV 10.4 7.5 - 12.5 fL   Neutro Abs 6,801 1,500 - 7,800 cells/uL   Lymphs Abs 2,376 850 - 3,900 cells/uL   Absolute Monocytes 564 200 - 950 cells/uL   Eosinophils Absolute 119 15 - 500 cells/uL   Basophils Absolute 40 0 - 200 cells/uL   Neutrophils Relative % 68.7 %   Total Lymphocyte 24.0 %   Monocytes Relative 5.7 %   Eosinophils Relative 1.2 %   Basophils Relative 0.4 %    PHQ2/9: Depression screen [facility] 2/9 01/14/2019 12/01/2018 09/24/2018 01/31/2018 12/13/2017  Decreased Interest 0 0 0 0 0  Down, Depressed, Hopeless 0 0 0 0 0  PHQ - 2 Score 0 0 0 0 0  Altered sleeping 0 0 3 - -  Tired, decreased energy 0 0 0 - -  Change in appetite 0 0 0 - -  Feeling bad or  failure about yourself  0 0 0 - -  Trouble concentrating 0 0 0 - -  Moving slowly or fidgety/restless 0 0 0 - -  Suicidal thoughts 0 0 0 - -  PHQ-9 Score 0 0 3 - -  Difficult doing work/chores Not difficult at all Not difficult at all - - -    phq 9 is negative  Fall Risk: Fall Risk  01/14/2019 12/01/2018 09/24/2018 01/31/2018 12/13/2017  Falls in the past year? 0 0 0 No No  Comment - - - - -  Number falls in  past yr: 0 0 - - -  Injury with Fall? 0 0 - - -      Functional Status Survey: Is the patient deaf or have difficulty hearing?: No Does the patient have difficulty seeing, even when wearing glasses/contacts?: No Does the patient have difficulty concentrating, remembering, or making decisions?: No Does the patient have difficulty walking or climbing stairs?: No Does the patient have difficulty dressing or bathing?: No Does the patient have difficulty doing errands alone such as visiting a doctor's office or shopping?: No    Assessment & Plan:     ICD-10-CM   1. Hypertension, unspecified type  99991111 COMPLETE METABOLIC PANEL WITH GFR   well controlled, would like to get her off clonidine, but pt insists that it is helping her with multiple problems and would like to continue  2. Obesity (BMI 35.0-39.9 without comorbidity)  E66.9    counseled about diet and exercise  3. Multinodular goiter  E04.2    f/up labs - will need to further review records to see if she needs repeated imaging or endocrinology  4. Hyperthyroidism, subclinical  E05.90 TSH    T4    COMPLETE METABOLIC PANEL WITH GFR   last TSH low, f/up labs, some sx possibly could be hyperthyroid, but no concern for thyroid storm or Graves' at this point  5. Hot flashes  R23.2 TSH    T4  6. Muscle cramps  [id number] COMPLETE METABOLIC PANEL WITH GFR   We will recheck chemistry and electrolytes, encouraged her to get on a potassium and supplement magnesium  7. Night sweats  R61 TSH    T4   will recheck thyroid, may also be  perimenopausal sx  8. Right shoulder pain, unspecified chronicity  M25.511 Ambulatory referral to Orthopedics    meloxicam (MOBIC) 7.5 MG tablet   2014 right shoulder injury, prior injections with ortho, pain recently worse, refer to ortho      Return in about 6 months (around 07/14/2019) for Routine follow-up, Annual Physical (CPE when due next year - split visit).   Delsa Grana, PA-C 01/14/19 8:19 AM

## 2019-01-15 ENCOUNTER — Telehealth: Payer: Self-pay

## 2019-01-15 LAB — COMPLETE METABOLIC PANEL WITH GFR
AG Ratio: 1.4 (calc) (ref 1.0–2.5)
ALT: 10 U/L (ref 6–29)
AST: 15 U/L (ref 10–35)
Albumin: 4 g/dL (ref 3.6–5.1)
Alkaline phosphatase (APISO): 67 U/L (ref 31–125)
BUN: 11 mg/dL (ref 7–25)
CO2: 27 mmol/L (ref 20–32)
Calcium: 9.5 mg/dL (ref 8.6–10.2)
Chloride: 106 mmol/L (ref 98–110)
Creat: 0.72 mg/dL (ref 0.50–1.10)
GFR, Est African American: 115 mL/min/{1.73_m2} (ref >=60)
GFR, Est Non African American: 99 mL/min/{1.73_m2} (ref >=60)
Globulin: 2.8 g/dL (calc) (ref 1.9–3.7)
Glucose, Bld: 91 mg/dL (ref 65–99)
Potassium: 4.7 mmol/L (ref 3.5–5.3)
Sodium: 141 mmol/L (ref 135–146)
Total Bilirubin: 0.4 mg/dL (ref 0.2–1.2)
Total Protein: 6.8 g/dL (ref 6.1–8.1)

## 2019-01-15 LAB — T4: T4, Total: 9.6 ug/dL (ref 5.1–11.9)

## 2019-01-15 LAB — TSH: TSH: 0.32 mIU/L — ABNORMAL LOW

## 2019-01-15 NOTE — Telephone Encounter (Signed)
Copied from [place] [id number]. Topic: General - Other >> Jan 14, 2019 11:46 AM Keene Breath wrote: Reason for CRM: Patient called to give the information regarding her previous orthopedic doctor.  Patient also stated she did sign the medical release form today.  It was [employer], Phone [phone].  If there are any questions, please call back at [phone]   Could you send the above listed clinical her release of records

## 2019-01-20 ENCOUNTER — Ambulatory Visit (INDEPENDENT_AMBULATORY_CARE_PROVIDER_SITE_OTHER): Payer: BC Managed Care – PPO

## 2019-01-20 ENCOUNTER — Encounter (INDEPENDENT_AMBULATORY_CARE_PROVIDER_SITE_OTHER): Payer: Self-pay | Admitting: Vascular Surgery

## 2019-01-20 ENCOUNTER — Other Ambulatory Visit: Payer: Self-pay

## 2019-01-20 ENCOUNTER — Ambulatory Visit (INDEPENDENT_AMBULATORY_CARE_PROVIDER_SITE_OTHER): Payer: BC Managed Care – PPO | Admitting: Vascular Surgery

## 2019-01-20 VITALS — BP 154/92 | HR 89 | Resp 12 | Ht 62.0 in | Wt 229.0 lb

## 2019-01-20 DIAGNOSIS — I10 Essential (primary) hypertension: Secondary | ICD-10-CM | POA: Diagnosis not present

## 2019-01-20 DIAGNOSIS — I83813 Varicose veins of bilateral lower extremities with pain: Secondary | ICD-10-CM | POA: Diagnosis not present

## 2019-01-20 DIAGNOSIS — I890 Lymphedema, not elsewhere classified: Secondary | ICD-10-CM

## 2019-01-20 NOTE — Assessment & Plan Note (Addendum)
Venous duplex showed no evidence of DVT, superficial thrombophlebitis, or superficial venous reflux.  No venous intervention will be planned at this time.  Continue local conservative therapies and we would recommend the addition of a lymphedema pump for treatment of her chronic pain and swelling likely from scarring and lymphatic channels and lymphedema.  Return to clinic in 4 to 6 months to reassess

## 2019-01-20 NOTE — Progress Notes (Signed)
Patient ID: Wendy Kent, female   DOB: December 12, 1970, 48 y.o.   MRN: [id number]  Chief Complaint  Patient presents with  . Follow-up    HPI Wendy Kent is a 48 y.o. female.  Patient returns in follow up of their venous disease.  They have done their best to comply with the prescribed conservative therapies of compression stockings, leg elevation, exercise, and still requires anti-inflammatories for discomfort and has symptoms that are persistent and bothersome on a daily basis, affecting their activities of daily living and normal activities.  Her symptoms are somewhat improved however and she does feel better than she did at her initial visit 3 months ago.  The venous reflux study demonstrates no evidence of deep venous thrombosis, superficial thrombophlebitis, or superficial venous reflux in either lower extremity although it was somewhat of a limited study because of her body habitus.    Past Medical History:  Diagnosis Date  . Allergy   . Blood transfusion without reported diagnosis   . BP (high blood pressure) 12/29/2012  . Breast hypertrophy 11/10/2014  . GERD (gastroesophageal reflux disease)   . Thyroid disease    patient was cleared by specialist. History of Thyroid nodules.    Past Surgical History:  Procedure Laterality Date  . ABDOMINAL HYSTERECTOMY     patient still has ovaries  . BREAST REDUCTION SURGERY Bilateral 08/05/2017   Procedure: BREAST REDUCTION WITH LIPOSUCTION;  Surgeon: Cristine Polio, MD;  Location: [facility];  Service: Plastics;  Laterality: Bilateral;  . CHOLECYSTECTOMY    . COLONOSCOPY WITH PROPOFOL N/A 03/26/2018   Procedure: COLONOSCOPY WITH PROPOFOL;  Surgeon: Jonathon Bellows, MD;  Location: [facility] END[facility]COPY;  Service: Gastroenterology;  Laterality: N/A;  . ESOPHAGOGASTRODUODENOSCOPY (EGD) WITH PROPOFOL N/A 03/26/2018   Procedure: ESOPHAGOGASTRODUODENOSCOPY (EGD) WITH PROPOFOL;  Surgeon: Jonathon Bellows, MD;  Location: [facility] ENDOSCOPY;   Service: Gastroenterology;  Laterality: N/A;  . GIVENS CAPSULE STUDY N/A 04/14/2018   Procedure: GIVENS CAPSULE STUDY;  Surgeon: Jonathon Bellows, MD;  Location: [facility] ENDOSCOPY;  Service: Gastroenterology;  Laterality: N/A;  . SHOULDER ARTHROSCOPY Right   . TUBAL LIGATION      Family History Mother had some varicose veins but not severe No history of bleeding disorders, clotting disorders, or DVT   Social History        Tobacco Use  . Smoking status: Never Smoker  . Smokeless tobacco: Never Used  Substance Use Topics  . Alcohol use: Yes    Alcohol/week: 0.0 standard drinks    Comment: socially  . Drug use: No      Allergies  Allergen Reactions  . Percocet [Oxycodone-Acetaminophen] Anaphylaxis    Current Outpatient Medications  Medication Sig Dispense Refill  . cloNIDine (CATAPRES) 0.1 MG tablet Take 1 tablet (0.1 mg total) by mouth at bedtime. 90 tablet 1  . fluticasone (FLONASE) 50 MCG/ACT nasal spray PLACE 2 SPRAYS INTO BOTH NOSTRILS DAILY AS NEEDED. 48 mL 3  . meloxicam (MOBIC) 7.5 MG tablet Take 1 tablet (7.5 mg total) by mouth daily. 30 tablet 0  . Turmeric Curcumin 500 MG CAPS Take 1 capsule by mouth daily.     No current facility-administered medications for this visit.       REVIEW OF SYSTEMS (Negative unless checked)  Constitutional: [] Weight loss  [] Fever  [] Chills Cardiac: [] Chest pain   [] Chest pressure   [] Palpitations   [] Shortness of breath when laying flat   [] Shortness of breath at rest   [] Shortness of breath with exertion. Vascular:  [] Pain  in legs with walking   [] Pain in legs at rest   [] Pain in legs when laying flat   [] Claudication   [] Pain in feet when walking  [] Pain in feet at rest  [] Pain in feet when laying flat   [] History of DVT   [] Phlebitis   [x] Swelling in legs   [x] Varicose veins   [] Non-healing ulcers Pulmonary:   [] Uses home oxygen   [] Productive cough   [] Hemoptysis   [] Wheeze  [] COPD   [] Asthma Neurologic:  [] Dizziness   [] Blackouts   [] Seizures   [] History of stroke   [] History of TIA  [] Aphasia   [] Temporary blindness   [] Dysphagia   [] Weakness or numbness in arms   [] Weakness or numbness in legs Musculoskeletal:  [] Arthritis   [] Joint swelling   [] Joint pain   [] Low back pain Hematologic:  [] Easy bruising  [] Easy bleeding   [] Hypercoagulable state   [] Anemic  [] Hepatitis Gastrointestinal:  [] Blood in stool   [] Vomiting blood  [x] Gastroesophageal reflux/heartburn   [] Abdominal pain Genitourinary:  [] Chronic kidney disease   [] Difficult urination  [] Frequent urination  [] Burning with urination   [] Hematuria Skin:  [] Rashes   [] Ulcers   [] Wounds Psychological:  [] History of anxiety   []  History of major depression.    Physical Exam BP (!) 154/92 (BP Location: Left Arm, Patient Position: Sitting, Cuff Size: Normal)   Pulse 89   Resp 12   Ht 5\' 2"  (1.575 m)   Wt 229 lb (103.9 kg)   BMI 41.88 kg/m  Gen:  WD/WN, NAD Head: [place]/AT, No temporalis wasting.  Ear/Nose/Throat: Hearing grossly intact, dentition good Eyes: Sclera non-icteric. Conjunctiva clear Neck: Supple. Trachea midline Pulmonary:  Good air movement, no use of accessory muscles, respirations not labored.  Cardiac: RRR, No JVD Vascular: Varicosities scattered and measuring up to 1-2 mm in the right lower extremity        Varicosities scattered and measuring up to 1-2 mm in the left lower extremity Vessel Right Left  Radial Palpable Palpable                          PT Palpable Palpable  DP Palpable Palpable    Musculoskeletal: M/S 5/5 throughout.  1+ bilateral lower extremity edema Neurologic: Sensation grossly intact in extremities.  Symmetrical.  Speech is fluent.  Psychiatric: Judgment intact, Mood & affect appropriate for pt's clinical situation. Dermatologic: No rashes or ulcers noted.  No cellulitis or open wounds.  Radiology No results found.  Labs Recent Results (from the past 2160 hour(s))  TSH     Status: Abnormal    Collection Time: 12/01/18  4:34 PM  Result Value Ref Range   TSH 0.18 (L) mIU/L    Comment:           Reference Range .           > or = 20 Years  0.40-4.50 .                Pregnancy Ranges           First trimester    0.26-2.66           Second trimester   0.55-2.73           Third trimester    0.43-2.91   CBC w/Diff/Platelet     Status: None   Collection Time: 12/01/18  4:34 PM  Result Value Ref Range   WBC 9.9 3.8 - 10.8 Thousand/uL   RBC  4.43 3.80 - 5.10 Million/uL   Hemoglobin 12.5 11.7 - 15.5 g/dL   HCT 38.1 35.0 - 45.0 %   MCV 86.0 80.0 - 100.0 fL   MCH 28.2 27.0 - 33.0 pg   MCHC 32.8 32.0 - 36.0 g/dL   RDW 14.5 11.0 - 15.0 %   Platelets 364 140 - 400 Thousand/uL   MPV 10.4 7.5 - 12.5 fL   Neutro Abs 6,801 1,500 - 7,800 cells/uL   Lymphs Abs 2,376 850 - 3,900 cells/uL   Absolute Monocytes 564 200 - 950 cells/uL   Eosinophils Absolute 119 15 - 500 cells/uL   Basophils Absolute 40 0 - 200 cells/uL   Neutrophils Relative % 68.7 %   Total Lymphocyte 24.0 %   Monocytes Relative 5.7 %   Eosinophils Relative 1.2 %   Basophils Relative 0.4 %  TSH     Status: Abnormal   Collection Time: 01/14/19 12:00 AM  Result Value Ref Range   TSH 0.32 (L) mIU/L    Comment:           Reference Range .           > or = 20 Years  0.40-4.50 .                Pregnancy Ranges           First trimester    0.26-2.66           Second trimester   0.55-2.73           Third trimester    0.43-2.91   T4     Status: None   Collection Time: 01/14/19 12:00 AM  Result Value Ref Range   T4, Total 9.6 5.1 - 11.9 mcg/dL  COMPLETE METABOLIC PANEL WITH GFR     Status: None   Collection Time: 01/14/19 12:00 AM  Result Value Ref Range   Glucose, Bld 91 65 - 99 mg/dL    Comment: .            Fasting reference interval .    BUN 11 7 - 25 mg/dL   Creat 0.72 0.50 - 1.10 mg/dL   GFR, Est Non African American 99 > OR = 60 mL/min/1.22m2   GFR, Est African American 115 > OR = 60 mL/min/1.93m2    BUN/Creatinine Ratio NOT APPLICABLE 6 - 22 (calc)   Sodium 141 135 - 146 mmol/L   Potassium 4.7 3.5 - 5.3 mmol/L   Chloride 106 98 - 110 mmol/L   CO2 27 20 - 32 mmol/L   Calcium 9.5 8.6 - 10.2 mg/dL   Total Protein 6.8 6.1 - 8.1 g/dL   Albumin 4.0 3.6 - 5.1 g/dL   Globulin 2.8 1.9 - 3.7 g/dL (calc)   AG Ratio 1.4 1.0 - 2.5 (calc)   Total Bilirubin 0.4 0.2 - 1.2 mg/dL   Alkaline phosphatase (APISO) 67 31 - 125 U/L   AST 15 10 - 35 U/L   ALT 10 6 - 29 U/L    Assessment/Plan: Hypertension goal BP (blood pressure) < 140/90 blood pressure control important in reducing the progression of atherosclerotic disease. On appropriate oral medications.  Varicose veins of leg with pain, bilateral  Venous duplex showed no evidence of DVT, superficial thrombophlebitis, or superficial venous reflux.  No venous intervention will be planned at this time.  Continue local conservative therapies and we would recommend the addition of a lymphedema pump for treatment of her chronic pain and swelling likely from scarring  and lymphatic channels and lymphedema.  Return to clinic in 4 to 6 months to reassess  Lymphedema The patient is likely developed lymphedema from chronic scarring in the lymphatic channels creating pain and swelling.  She would have stage I-II lymphedema.  She is improved with conservative therapies over the past 3 months, but does still have symptoms.  I think a lymphedema pump would be an excellent adjuvant option to improve her symptom relief. We will try to get that approved at this time and see her back in 4-6 months to reassess.       Leotis Pain 01/21/2019, 8:57 AM

## 2019-01-21 DIAGNOSIS — I890 Lymphedema, not elsewhere classified: Secondary | ICD-10-CM | POA: Insufficient documentation

## 2019-01-21 NOTE — Patient Instructions (Signed)

## 2019-01-21 NOTE — Assessment & Plan Note (Addendum)
The patient is likely developed lymphedema from chronic scarring in the lymphatic channels creating pain and swelling.  She would have stage I-II lymphedema.  She is improved with conservative therapies over the past 3 months, but does still have symptoms.  I think a lymphedema pump would be an excellent adjuvant option to improve her symptom relief. We will try to get that approved at this time and see her back in 4-6 months to reassess.

## 2019-02-03 ENCOUNTER — Encounter: Payer: BLUE CROSS/BLUE SHIELD | Admitting: Family Medicine

## 2019-02-06 DIAGNOSIS — M19011 Primary osteoarthritis, right shoulder: Secondary | ICD-10-CM | POA: Diagnosis not present

## 2019-02-09 ENCOUNTER — Ambulatory Visit: Payer: Self-pay | Admitting: Dietician

## 2019-02-12 DIAGNOSIS — M25511 Pain in right shoulder: Secondary | ICD-10-CM | POA: Diagnosis not present

## 2019-02-12 DIAGNOSIS — M25611 Stiffness of right shoulder, not elsewhere classified: Secondary | ICD-10-CM | POA: Diagnosis not present

## 2019-02-16 DIAGNOSIS — M25511 Pain in right shoulder: Secondary | ICD-10-CM | POA: Diagnosis not present

## 2019-02-16 DIAGNOSIS — M25611 Stiffness of right shoulder, not elsewhere classified: Secondary | ICD-10-CM | POA: Diagnosis not present

## 2019-02-19 DIAGNOSIS — M25611 Stiffness of right shoulder, not elsewhere classified: Secondary | ICD-10-CM | POA: Diagnosis not present

## 2019-02-19 DIAGNOSIS — M25511 Pain in right shoulder: Secondary | ICD-10-CM | POA: Diagnosis not present

## 2019-02-24 DIAGNOSIS — M25511 Pain in right shoulder: Secondary | ICD-10-CM | POA: Diagnosis not present

## 2019-02-24 DIAGNOSIS — M25611 Stiffness of right shoulder, not elsewhere classified: Secondary | ICD-10-CM | POA: Diagnosis not present

## 2019-03-04 DIAGNOSIS — I890 Lymphedema, not elsewhere classified: Secondary | ICD-10-CM | POA: Diagnosis not present

## 2019-03-06 DIAGNOSIS — M25611 Stiffness of right shoulder, not elsewhere classified: Secondary | ICD-10-CM | POA: Diagnosis not present

## 2019-03-06 DIAGNOSIS — M25511 Pain in right shoulder: Secondary | ICD-10-CM | POA: Diagnosis not present

## 2019-03-11 DIAGNOSIS — M25511 Pain in right shoulder: Secondary | ICD-10-CM | POA: Diagnosis not present

## 2019-03-11 DIAGNOSIS — M25611 Stiffness of right shoulder, not elsewhere classified: Secondary | ICD-10-CM | POA: Diagnosis not present

## 2019-03-23 DIAGNOSIS — M25611 Stiffness of right shoulder, not elsewhere classified: Secondary | ICD-10-CM | POA: Diagnosis not present

## 2019-03-23 DIAGNOSIS — M25511 Pain in right shoulder: Secondary | ICD-10-CM | POA: Diagnosis not present

## 2019-03-31 DIAGNOSIS — M25611 Stiffness of right shoulder, not elsewhere classified: Secondary | ICD-10-CM | POA: Diagnosis not present

## 2019-03-31 DIAGNOSIS — M25511 Pain in right shoulder: Secondary | ICD-10-CM | POA: Diagnosis not present

## 2019-04-03 ENCOUNTER — Encounter: Payer: Self-pay | Admitting: Family Medicine

## 2019-04-03 DIAGNOSIS — M19011 Primary osteoarthritis, right shoulder: Secondary | ICD-10-CM | POA: Diagnosis not present

## 2019-04-03 DIAGNOSIS — M25511 Pain in right shoulder: Secondary | ICD-10-CM | POA: Diagnosis not present

## 2019-04-29 ENCOUNTER — Encounter: Payer: Self-pay | Admitting: Family Medicine

## 2019-05-05 ENCOUNTER — Ambulatory Visit (INDEPENDENT_AMBULATORY_CARE_PROVIDER_SITE_OTHER): Payer: BC Managed Care – PPO | Admitting: Family Medicine

## 2019-05-05 ENCOUNTER — Encounter: Payer: Self-pay | Admitting: Family Medicine

## 2019-05-05 ENCOUNTER — Other Ambulatory Visit: Payer: Self-pay

## 2019-05-05 VITALS — BP 138/88 | HR 87 | Temp 97.3°F | Resp 12 | Ht 62.0 in | Wt 231.4 lb

## 2019-05-05 DIAGNOSIS — Z1231 Encounter for screening mammogram for malignant neoplasm of breast: Secondary | ICD-10-CM | POA: Diagnosis not present

## 2019-05-05 DIAGNOSIS — Z6841 Body Mass Index (BMI) 40.0 and over, adult: Secondary | ICD-10-CM

## 2019-05-05 DIAGNOSIS — E042 Nontoxic multinodular goiter: Secondary | ICD-10-CM

## 2019-05-05 DIAGNOSIS — Z5181 Encounter for therapeutic drug level monitoring: Secondary | ICD-10-CM

## 2019-05-05 DIAGNOSIS — E0590 Thyrotoxicosis, unspecified without thyrotoxic crisis or storm: Secondary | ICD-10-CM | POA: Diagnosis not present

## 2019-05-05 MED ORDER — SAXENDA 18 MG/3ML ~~LOC~~ SOPN: 0.6000 mg | PEN_INJECTOR | Freq: Every day | SUBCUTANEOUS | 0 refills | Status: DC

## 2019-05-05 MED ORDER — BD PEN NEEDLE MINI U/F 31G X 5 MM MISC: 5 refills | Status: DC

## 2019-05-05 NOTE — Progress Notes (Signed)
Patient ID: Wendy Kent, female    DOB: 1970-07-05, 49 y.o.   MRN: [id number]  PCP: Delsa Grana, PA-C  Chief Complaint  Patient presents with  . Obesity    wants to see about getting back on saxenda?    Subjective:   Wendy Kent is a 49 y.o. female, presents to clinic with CC of the following:  HPI  Pt presents re weight/obesity and wants to discuss meds for weight loss  States she has been working on her diet Typical diet when asked what she is eating daily: Breakfast: yogurt raisens and granola Lunch: chicken tenders fruit cup  Dinners:  Salad from home or from [employer]  Exercise:  Walking around the house and taking breaks during work, 8 hours of sit down computer work so main effort is getting up and down from sitting during work day, and not much other physical activity  Weight change: gradual weight increase in the past year, up 16lbs she is working from home and she's trying to stay active at home, she's more sedentary  She previously took phentermine and saxenda  Hx of hyperthyroid - she does have hx of being hot natured, is hot at night, sometimes will feel like her HR is fast but it doesn't last long, usually goes away in a few seconds  No vision changes, no weight loss, no sweats during She has hx also of multinodular goiter with negative biopsy  Patient Active Problem List   Diagnosis Date Noted  . Lymphedema 01/21/2019  . Varicose veins of leg with pain, bilateral 10/14/2018  . Obesity (BMI 35.0-39.9 without comorbidity) 09/12/2017  . Hypokalemia 01/11/2016  . Hepatomegaly 12/22/2015  . Cardiomegaly 12/22/2015  . Lactose intolerance 09/26/2015  . IBS (irritable bowel syndrome) 09/26/2015  . Multinodular goiter 09/28/2014  . Hyperthyroidism, subclinical 09/28/2014  . Allergic rhinitis, seasonal 09/28/2014  . Iron deficiency anemia 12/29/2012  . Hypertension goal BP (blood pressure) < 140/90 12/29/2012  . Anemia 12/29/2012      Current  Outpatient Medications:  .  cloNIDine (CATAPRES) 0.1 MG tablet, Take 1 tablet (0.1 mg total) by mouth at bedtime., Disp: 90 tablet, Rfl: 1 .  fluticasone (FLONASE) 50 MCG/ACT nasal spray, PLACE 2 SPRAYS INTO BOTH NOSTRILS DAILY AS NEEDED., Disp: 48 mL, Rfl: 3 .  meloxicam (MOBIC) 7.5 MG tablet, Take 1 tablet (7.5 mg total) by mouth daily., Disp: 30 tablet, Rfl: 0   Allergies  Allergen Reactions  . Percocet [Oxycodone-Acetaminophen] Anaphylaxis     History reviewed. No pertinent family history.   Social History   Socioeconomic History  . Marital status: Single    Spouse name: Not on file  . Number of children: 4  . Years of education: 16  . Highest education level: Associate degree: academic program  Occupational History  . Occupation: TEFL teacher    Comment: [employer]  Tobacco Use  . Smoking status: Never Smoker  . Smokeless tobacco: Never Used  Substance and Sexual Activity  . Alcohol use: Yes    Alcohol/week: 0.0 standard drinks    Comment: socially  . Drug use: No  . Sexual activity: Not Currently    Birth control/protection: Surgical  Other Topics Concern  . Not on file  Social History Narrative  . Not on file   Social Determinants of Health   Financial Resource Strain:   . Difficulty of Paying Living Expenses: Not on file  Food Insecurity:   . Worried About Charity fundraiser in the Last Year:  Not on file  . Ran Out of Food in the Last Year: Not on file  Transportation Needs:   . Lack of Transportation (Medical): Not on file  . Lack of Transportation (Non-Medical): Not on file  Physical Activity:   . Days of Exercise per Week: Not on file  . Minutes of Exercise per Session: Not on file  Stress:   . Feeling of Stress : Not on file  Social Connections:   . Frequency of Communication with Friends and Family: Not on file  . Frequency of Social Gatherings with Friends and Family: Not on file  . Attends Religious Services: Not on file  . Active Member  of Clubs or Organizations: Not on file  . Attends Archivist Meetings: Not on file  . Marital Status: Not on file  Intimate Partner Violence:   . Fear of Current or Ex-Partner: Not on file  . Emotionally Abused: Not on file  . Physically Abused: Not on file  . Sexually Abused: Not on file    Chart Review Today: I personally reviewed active problem list, medication list, allergies, family history, social history, health maintenance, notes from last encounter, lab results, imaging with the patient/caregiver today. Reviewed past meds and doses for weight loss   Review of Systems  Constitutional: Negative.   HENT: Negative.   Eyes: Negative.   Respiratory: Negative.   Cardiovascular: Negative.   Gastrointestinal: Negative.   Endocrine: Negative.   Genitourinary: Negative.   Musculoskeletal: Negative.   Skin: Negative.   Allergic/Immunologic: Negative.   Neurological: Negative.   Hematological: Negative.   Psychiatric/Behavioral: Negative.   All other systems reviewed and are negative.      Objective:   Vitals:   05/05/19 1358  BP: 138/88  Pulse: 87  Resp: 12  Temp: (!) 97.3 F (36.3 C)  SpO2: 100%  Weight: 231 lb 6.4 oz (105 kg)  Height: 5\' 2"  (1.575 m)    Body mass index is 42.32 kg/m.  Physical Exam Vitals and nursing note reviewed.  Constitutional:      General: She is not in acute distress.    Appearance: Normal appearance. She is well-developed. She is not ill-appearing, toxic-appearing or diaphoretic.     Interventions: Face mask in place.  HENT:     Head: Normocephalic and atraumatic.     Right Ear: External ear normal.     Left Ear: External ear normal.  Eyes:     General: Lids are normal. No scleral icterus.       Right eye: No discharge.        Left eye: No discharge.     Conjunctiva/sclera: Conjunctivae normal.  Neck:     Trachea: Phonation normal. No tracheal deviation.  Cardiovascular:     Rate and Rhythm: Normal rate and regular  rhythm.     Pulses: Normal pulses.          Radial pulses are 2+ on the right side and 2+ on the left side.       Posterior tibial pulses are 2+ on the right side and 2+ on the left side.     Heart sounds: Normal heart sounds. No murmur. No friction rub. No gallop.   Pulmonary:     Effort: Pulmonary effort is normal. No respiratory distress.     Breath sounds: Normal breath sounds. No stridor. No wheezing, rhonchi or rales.  Chest:     Chest wall: No tenderness.  Abdominal:     General: Bowel sounds  are normal. There is no distension.     Palpations: Abdomen is soft.     Tenderness: There is no abdominal tenderness. There is no guarding or rebound.  Musculoskeletal:        General: No deformity. Normal range of motion.     Cervical back: Normal range of motion and neck supple.     Right lower leg: No edema.     Left lower leg: No edema.  Lymphadenopathy:     Cervical: No cervical adenopathy.  Skin:    General: Skin is warm and dry.     Capillary Refill: Capillary refill takes less than 2 seconds.     Coloration: Skin is not jaundiced or pale.     Findings: No rash.  Neurological:     Mental Status: She is alert and oriented to person, place, and time.     Motor: No abnormal muscle tone.     Gait: Gait normal.  Psychiatric:        Speech: Speech normal.        Behavior: Behavior normal.      Results for orders placed or performed in visit on 01/14/19  TSH  Result Value Ref Range   TSH 0.32 (L) mIU/L  T4  Result Value Ref Range   T4, Total 9.6 5.1 - 11.9 mcg/dL  COMPLETE METABOLIC PANEL WITH GFR  Result Value Ref Range   Glucose, Bld 91 65 - 99 mg/dL   BUN 11 7 - 25 mg/dL   Creat 0.72 0.50 - 1.10 mg/dL   GFR, Est Non African American 99 > OR = 60 mL/min/1.53m2   GFR, Est African American 115 > OR = 60 mL/min/1.59m2   BUN/Creatinine Ratio NOT APPLICABLE 6 - 22 (calc)   Sodium 141 135 - 146 mmol/L   Potassium 4.7 3.5 - 5.3 mmol/L   Chloride 106 98 - 110 mmol/L   CO2  27 20 - 32 mmol/L   Calcium 9.5 8.6 - 10.2 mg/dL   Total Protein 6.8 6.1 - 8.1 g/dL   Albumin 4.0 3.6 - 5.1 g/dL   Globulin 2.8 1.9 - 3.7 g/dL (calc)   AG Ratio 1.4 1.0 - 2.5 (calc)   Total Bilirubin 0.4 0.2 - 1.2 mg/dL   Alkaline phosphatase (APISO) 67 31 - 125 U/L   AST 15 10 - 35 U/L   ALT 10 6 - 29 U/L        Assessment & Plan:      ICD-10-CM   1. Class 3 severe obesity with serious comorbidity and body mass index (BMI) of 40.0 to 44.9 in adult, unspecified obesity type (HCC)  E66.01 Liraglutide -Weight Management (SAXENDA) 18 MG/3ML SOPN   Z68.41 Insulin Pen Needle (B-D UF III MINI PEN NEEDLES) 31G X 5 MM MISC    T4, Free    TSH    CBC w/ Diff    CMP w GFR   will see if pt qualifies for saxenda again for weight loss/obesity, reviewed past thyroid hx, she has done well with saxenda before, counseled diet and exercise  2. Hyperthyroidism, subclinical  E05.90 T4, Free    TSH   recheck labs  3. Multinodular goiter  E04.2 T4, Free    TSH   recheck labs  4. Encounter for screening mammogram for breast cancer  Z12.31 MM 3D SCREEN BREAST BILATERAL  5. Encounter for medication monitoring  Z51.81 T4, Free    TSH    CBC w/ Diff    CMP w  GFR     will need monthly f/up for med and weight checks with saxenda (or still with phentermine)  Spent significant amount of time discussing calorie reduction, increased aerobic exercise and gradual changes to food choices to make it sustainable, d/c fast foods or choose healthy options, avoid calories in dressings etc, increase lean meat and vegetables, avoid simple carbs, sweets, soda/sweat tee etc.   Delsa Grana, PA-C 05/05/19 2:06 PM

## 2019-05-05 NOTE — Patient Instructions (Signed)
Healthy Eating Following a healthy eating pattern may help you to achieve and maintain a healthy body weight, reduce the risk of chronic disease, and live a long and productive life. It is important to follow a healthy eating pattern at an appropriate calorie level for your body. Your nutritional needs should be met primarily through food by choosing a variety of nutrient-rich foods. What are tips for following this plan? Reading food labels  Read labels and choose the following: ? Reduced or low sodium. ? Juices with 100% fruit juice. ? Foods with low saturated fats and high polyunsaturated and monounsaturated fats. ? Foods with whole grains, such as whole wheat, cracked wheat, brown rice, and wild rice. ? Whole grains that are fortified with folic acid. This is recommended for women who are pregnant or who want to become pregnant.  Read labels and avoid the following: ? Foods with a lot of added sugars. These include foods that contain brown sugar, corn sweetener, corn syrup, dextrose, fructose, glucose, high-fructose corn syrup, honey, invert sugar, lactose, malt syrup, maltose, molasses, raw sugar, sucrose, trehalose, or turbinado sugar.  Do not eat more than the following amounts of added sugar per day:  6 teaspoons (25 g) for women.  9 teaspoons (38 g) for men. ? Foods that contain processed or refined starches and grains. ? Refined grain products, such as white flour, degermed cornmeal, white bread, and white rice. Shopping  Choose nutrient-rich snacks, such as vegetables, whole fruits, and nuts. Avoid high-calorie and high-sugar snacks, such as potato chips, fruit snacks, and candy.  Use oil-based dressings and spreads on foods instead of solid fats such as butter, stick margarine, or cream cheese.  Limit pre-made sauces, mixes, and "instant" products such as flavored rice, instant noodles, and ready-made pasta.  Try more plant-protein sources, such as tofu, tempeh, black beans,  edamame, lentils, nuts, and seeds.  Explore eating plans such as the Mediterranean diet or vegetarian diet. Cooking  Use oil to saut or stir-fry foods instead of solid fats such as butter, stick margarine, or lard.  Try baking, boiling, grilling, or broiling instead of frying.  Remove the fatty part of meats before cooking.  Steam vegetables in water or broth. Meal planning   At meals, imagine dividing your plate into fourths: ? One-half of your plate is fruits and vegetables. ? One-fourth of your plate is whole grains. ? One-fourth of your plate is protein, especially lean meats, poultry, eggs, tofu, beans, or nuts.  Include low-fat dairy as part of your daily diet. Lifestyle  Choose healthy options in all settings, including home, work, school, restaurants, or stores.  Prepare your food safely: ? Wash your hands after handling raw meats. ? Keep food preparation surfaces clean by regularly washing with hot, soapy water. ? Keep raw meats separate from ready-to-eat foods, such as fruits and vegetables. ? Cook seafood, meat, poultry, and eggs to the recommended internal temperature. ? Store foods at safe temperatures. In general:  Keep cold foods at 59F (4.4C) or below.  Keep hot foods at 159F (60C) or above.  Keep your freezer at [facility] (-17.8C) or below.  Foods are no longer safe to eat when they have been between the temperatures of 40-159F (4.4-60C) for more than 2 hours. What foods should I eat? Fruits Aim to eat 2 cup-equivalents of fresh, canned (in natural juice), or frozen fruits each day. Examples of 1 cup-equivalent of fruit include 1 small apple, 8 large strawberries, 1 cup canned fruit,  cup  dried fruit, or 1 cup 100% juice. Vegetables Aim to eat 2-3 cup-equivalents of fresh and frozen vegetables each day, including different varieties and colors. Examples of 1 cup-equivalent of vegetables include 2 medium carrots, 2 cups raw, leafy greens, 1 cup chopped  vegetable (raw or cooked), or 1 medium baked potato. Grains Aim to eat 6 ounce-equivalents of whole grains each day. Examples of 1 ounce-equivalent of grains include 1 slice of bread, 1 cup ready-to-eat cereal, 3 cups popcorn, or  cup cooked rice, pasta, or cereal. Meats and other proteins Aim to eat 5-6 ounce-equivalents of protein each day. Examples of 1 ounce-equivalent of protein include 1 egg, 1/2 cup nuts or seeds, or 1 tablespoon (16 g) peanut butter. A cut of meat or fish that is the size of a deck of cards is about 3-4 ounce-equivalents.  Of the protein you eat each week, try to have at least 8 ounces come from seafood. This includes salmon, trout, herring, and anchovies. Dairy Aim to eat 3 cup-equivalents of fat-free or low-fat dairy each day. Examples of 1 cup-equivalent of dairy include 1 cup (240 mL) milk, 8 ounces (250 g) yogurt, 1 ounces (44 g) natural cheese, or 1 cup (240 mL) fortified soy milk. Fats and oils  Aim for about 5 teaspoons (21 g) per day. Choose monounsaturated fats, such as canola and olive oils, avocados, peanut butter, and most nuts, or polyunsaturated fats, such as sunflower, corn, and soybean oils, walnuts, pine nuts, sesame seeds, sunflower seeds, and flaxseed. Beverages  Aim for six 8-oz glasses of water per day. Limit coffee to three to five 8-oz cups per day.  Limit caffeinated beverages that have added calories, such as soda and energy drinks.  Limit alcohol intake to no more than 1 drink a day for nonpregnant women and 2 drinks a day for men. One drink equals 12 oz of beer (355 mL), 5 oz of wine (148 mL), or 1 oz of hard liquor (44 mL). Seasoning and other foods  Avoid adding excess amounts of salt to your foods. Try flavoring foods with herbs and spices instead of salt.  Avoid adding sugar to foods.  Try using oil-based dressings, sauces, and spreads instead of solid fats. This information is based on general [place] nutrition guidelines. For more  information, visit [employer]. Exact amounts may vary based on your nutrition needs. Summary  A healthy eating plan may help you to maintain a healthy weight, reduce the risk of chronic diseases, and stay active throughout your life.  Plan your meals. Make sure you eat the right portions of a variety of nutrient-rich foods.  Try baking, boiling, grilling, or broiling instead of frying.  Choose healthy options in all settings, including home, work, school, restaurants, or stores. This information is not intended to replace advice given to you by your health care provider. Make sure you discuss any questions you have with your health care provider. Document Revised: 07/22/2017 Document Reviewed: 07/22/2017 Elsevier Patient Education  [employer].    Liraglutide injection (Weight Management) What is this medicine? LIRAGLUTIDE (LIR a GLOO tide) is used to help people lose weight and maintain weight loss. It is used with a reduced-calorie diet and exercise. This medicine may be used for other purposes; ask your health care provider or pharmacist if you have questions. COMMON BRAND NAME(S): Saxenda What should I tell my health care provider before I take this medicine? They need to know if you have any of these conditions:  endocrine tumors (  MEN 2) or if someone in your family had these tumors  gallbladder disease  high cholesterol  history of alcohol abuse problem  history of pancreatitis  kidney disease or if you are on dialysis  liver disease  previous swelling of the tongue, face, or lips with difficulty breathing, difficulty swallowing, hoarseness, or tightening of the throat  stomach problems  suicidal thoughts, plans, or attempt; a previous suicide attempt by you or a family member  thyroid cancer or if someone in your family had thyroid cancer  an unusual or allergic reaction to liraglutide, other medicines, foods, dyes, or preservatives  pregnant or  trying to get pregnant  breast-feeding How should I use this medicine? This medicine is for injection under the skin of your upper leg, stomach area, or upper arm. You will be taught how to prepare and give this medicine. Use exactly as directed. Take your medicine at regular intervals. Do not take it more often than directed. This drug comes with INSTRUCTIONS FOR USE. Ask your pharmacist for directions on how to use this drug. Read the information carefully. Talk to your pharmacist or health care provider if you have questions. It is important that you put your used needles and syringes in a special sharps container. Do not put them in a trash can. If you do not have a sharps container, call your pharmacist or healthcare provider to get one. A special MedGuide will be given to you by the pharmacist with each prescription and refill. Be sure to read this information carefully each time. Talk to your pediatrician regarding the use of this medicine in children. Special care may be needed. Overdosage: If you think you have taken too much of this medicine contact a poison control center or emergency room at once. NOTE: This medicine is only for you. Do not share this medicine with others. What if I miss a dose? If you miss a dose, take it as soon as you can. If it is almost time for your next dose, take only that dose. Do not take double or extra doses. If you miss your dose for 3 days or more, call your doctor or health care professional to talk about how to restart this medicine. What may interact with this medicine?  insulin and other medicines for diabetes This list may not describe all possible interactions. Give your health care provider a list of all the medicines, herbs, non-prescription drugs, or dietary supplements you use. Also tell them if you smoke, drink alcohol, or use illegal drugs. Some items may interact with your medicine. What should I watch for while using this medicine? Visit your  doctor or health care professional for regular checks on your progress. Drink plenty of fluids while taking this medicine. Check with your doctor or health care professional if you get an attack of severe diarrhea, nausea, and vomiting. The loss of too much body fluid can make it dangerous for you to take this medicine. This medicine may affect blood sugar levels. Ask your healthcare provider if changes in diet or medicines are needed if you have diabetes. Patients and their families should watch out for worsening depression or thoughts of suicide. Also watch out for sudden changes in feelings such as feeling anxious, agitated, panicky, irritable, hostile, aggressive, impulsive, severely restless, overly excited and hyperactive, or not being able to sleep. If this happens, especially at the beginning of treatment or after a change in dose, call your health care professional. Women should inform their health  care provider if they wish to become pregnant or think they might be pregnant. Losing weight while pregnant is not advised and may cause harm to the unborn child. Talk to your health care provider for more information. What side effects may I notice from receiving this medicine? Side effects that you should report to your doctor or health care professional as soon as possible:  allergic reactions like skin rash, itching or hives, swelling of the face, lips, or tongue  breathing problems  diarrhea that continues or is severe  lump or swelling on the neck  severe nausea  signs and symptoms of infection like fever or chills; cough; sore throat; pain or trouble passing urine  signs and symptoms of low blood sugar such as feeling anxious; confusion; dizziness; increased hunger; unusually weak or tired; increased sweating; shakiness; cold, clammy skin; irritable; headache; blurred vision; fast heartbeat; loss of consciousness  signs and symptoms of kidney injury like trouble passing urine or  change in the amount of urine  trouble swallowing  unusual stomach upset or pain  vomiting Side effects that usually do not require medical attention (report to your doctor or health care professional if they continue or are bothersome):  constipation  decreased appetite  diarrhea  fatigue  headache  nausea  pain, redness, or irritation at site where injected  stomach upset  stuffy or runny nose This list may not describe all possible side effects. Call your doctor for medical advice about side effects. You may report side effects to FDA at 1-800-FDA-1088. Where should I keep my medicine? Keep out of the reach of children. Store unopened pen in a refrigerator between 2 and 8 degrees C (36 and 46 degrees F). Do not freeze or use if the medicine has been frozen. Protect from light and excessive heat. After you first use the pen, it can be stored at room temperature between 15 and 30 degrees C (59 and 86 degrees F) or in a refrigerator. Throw away your used pen after 30 days or after the expiration date, whichever comes first. Do not store your pen with the needle attached. If the needle is left on, medicine may leak from the pen. NOTE: This sheet is a summary. It may not cover all possible information. If you have questions about this medicine, talk to your doctor, pharmacist, or health care provider.  2020 Elsevier/Gold Standard (2019-02-12 21:16:59)

## 2019-05-06 LAB — CBC WITH DIFFERENTIAL/PLATELET
Absolute Monocytes: 479 cells/uL (ref 200–950)
Basophils Absolute: 38 cells/uL (ref 0–200)
Basophils Relative: 0.4 %
Eosinophils Absolute: 103 cells/uL (ref 15–500)
Eosinophils Relative: 1.1 %
HCT: 39.7 % (ref 35.0–45.0)
Hemoglobin: 12.8 g/dL (ref 11.7–15.5)
Lymphs Abs: 2500 cells/uL (ref 850–3900)
MCH: 27.8 pg (ref 27.0–33.0)
MCHC: 32.2 g/dL (ref 32.0–36.0)
MCV: 86.1 fL (ref 80.0–100.0)
MPV: 10.3 fL (ref 7.5–12.5)
Monocytes Relative: 5.1 %
Neutro Abs: 6279 cells/uL (ref 1500–7800)
Neutrophils Relative %: 66.8 %
Platelets: 352 10*3/uL (ref 140–400)
RBC: 4.61 10*6/uL (ref 3.80–5.10)
RDW: 14.3 % (ref 11.0–15.0)
Total Lymphocyte: 26.6 %
WBC: 9.4 10*3/uL (ref 3.8–10.8)

## 2019-05-06 LAB — COMPLETE METABOLIC PANEL WITH GFR
AG Ratio: 1.3 (calc) (ref 1.0–2.5)
ALT: 15 U/L (ref 6–29)
AST: 14 U/L (ref 10–35)
Albumin: 4.2 g/dL (ref 3.6–5.1)
Alkaline phosphatase (APISO): 80 U/L (ref 31–125)
BUN: 14 mg/dL (ref 7–25)
CO2: 31 mmol/L (ref 20–32)
Calcium: 10 mg/dL (ref 8.6–10.2)
Chloride: 105 mmol/L (ref 98–110)
Creat: 0.7 mg/dL (ref 0.50–1.10)
GFR, Est African American: 119 mL/min/{1.73_m2} (ref >=60)
GFR, Est Non African American: 102 mL/min/{1.73_m2} (ref >=60)
Globulin: 3.2 g/dL (calc) (ref 1.9–3.7)
Glucose, Bld: 89 mg/dL (ref 65–99)
Potassium: 4.6 mmol/L (ref 3.5–5.3)
Sodium: 146 mmol/L (ref 135–146)
Total Bilirubin: 0.4 mg/dL (ref 0.2–1.2)
Total Protein: 7.4 g/dL (ref 6.1–8.1)

## 2019-05-06 LAB — T4, FREE: Free T4: 1.2 ng/dL (ref 0.8–1.8)

## 2019-05-06 LAB — TSH: TSH: 0.48 mIU/L

## 2019-05-26 ENCOUNTER — Ambulatory Visit (INDEPENDENT_AMBULATORY_CARE_PROVIDER_SITE_OTHER): Payer: BC Managed Care – PPO | Admitting: Vascular Surgery

## 2019-06-02 ENCOUNTER — Ambulatory Visit: Payer: BC Managed Care – PPO | Attending: Internal Medicine

## 2019-06-02 DIAGNOSIS — Z20822 Contact with and (suspected) exposure to covid-19: Secondary | ICD-10-CM

## 2019-06-03 LAB — NOVEL CORONAVIRUS, NAA: SARS-CoV-2, NAA: NOT DETECTED

## 2019-06-05 ENCOUNTER — Encounter: Payer: Self-pay | Admitting: Family Medicine

## 2019-06-05 ENCOUNTER — Ambulatory Visit (INDEPENDENT_AMBULATORY_CARE_PROVIDER_SITE_OTHER): Payer: BC Managed Care – PPO | Admitting: Family Medicine

## 2019-06-05 ENCOUNTER — Other Ambulatory Visit: Payer: Self-pay

## 2019-06-05 VITALS — BP 122/86 | HR 90 | Temp 97.1°F | Resp 16 | Ht 62.0 in | Wt 228.7 lb

## 2019-06-05 DIAGNOSIS — M25522 Pain in left elbow: Secondary | ICD-10-CM

## 2019-06-05 DIAGNOSIS — Z6841 Body Mass Index (BMI) 40.0 and over, adult: Secondary | ICD-10-CM | POA: Diagnosis not present

## 2019-06-05 MED ORDER — MELOXICAM 7.5 MG PO TABS: 7.5000 mg | ORAL_TABLET | Freq: Every day | ORAL | 0 refills | Status: DC

## 2019-06-05 MED ORDER — SAXENDA 18 MG/3ML ~~LOC~~ SOPN: 3.0000 mg | PEN_INJECTOR | Freq: Every day | SUBCUTANEOUS | 1 refills | Status: DC

## 2019-06-05 NOTE — Patient Instructions (Signed)
Calorie Counting for Weight Loss Calories are units of energy. Your body needs a certain amount of calories from food to keep you going throughout the day. When you eat more calories than your body needs, your body stores the extra calories as fat. When you eat fewer calories than your body needs, your body burns fat to get the energy it needs. Calorie counting means keeping track of how many calories you eat and drink each day. Calorie counting can be helpful if you need to lose weight. If you make sure to eat fewer calories than your body needs, you should lose weight. Ask your health care provider what a healthy weight is for you. For calorie counting to work, you will need to eat the right number of calories in a day in order to lose a healthy amount of weight per week. A dietitian can help you determine how many calories you need in a day and will give you suggestions on how to reach your calorie goal.  A healthy amount of weight to lose per week is usually 1-2 lb (0.5-0.9 kg). This usually means that your daily calorie intake should be reduced by 500-750 calories.  Eating 1,200 - 1,500 calories per day can help most women lose weight.  Eating 1,500 - 1,800 calories per day can help most men lose weight.   What do I need to know about calorie counting? In order to meet your daily calorie goal, you will need to:  Find out how many calories are in each food you would like to eat. Try to do this before you eat.  Decide how much of the food you plan to eat.  Write down what you ate and how many calories it had. Doing this is called keeping a food log. To successfully lose weight, it is important to balance calorie counting with a healthy lifestyle that includes regular activity. Aim for 150 minutes of moderate exercise (such as walking) or 75 minutes of vigorous exercise (such as running) each week. Where do I find calorie information?  The number of calories in a food can be found on a  Nutrition Facts label. If a food does not have a Nutrition Facts label, try to look up the calories online or ask your dietitian for help. Remember that calories are listed per serving. If you choose to have more than one serving of a food, you will have to multiply the calories per serving by the amount of servings you plan to eat. For example, the label on a package of bread might say that a serving size is 1 slice and that there are 90 calories in a serving. If you eat 1 slice, you will have eaten 90 calories. If you eat 2 slices, you will have eaten 180 calories. How do I keep a food log? Immediately after each meal, record the following information in your food log:  What you ate. Don't forget to include toppings, sauces, and other extras on the food.  How much you ate. This can be measured in cups, ounces, or number of items.  How many calories each food and drink had.  The total number of calories in the meal. Keep your food log near you, such as in a small notebook in your pocket, or use a mobile app or website. Some programs will calculate calories for you and show you how many calories you have left for the day to meet your goal. What are some calorie counting tips?  Use your calories on foods and drinks that will fill you up and not leave you hungry: ? Some examples of foods that fill you up are nuts and nut butters, vegetables, lean proteins, and high-fiber foods like whole grains. High-fiber foods are foods with more than 5 g fiber per serving. ? Drinks such as sodas, specialty coffee drinks, alcohol, and juices have a lot of calories, yet do not fill you up.  Eat nutritious foods and avoid empty calories. Empty calories are calories you get from foods or beverages that do not have many vitamins or protein, such as candy, sweets, and soda. It is better to have a nutritious high-calorie food (such as an avocado) than a food with few nutrients (such as a bag of chips).  Know how  many calories are in the foods you eat most often. This will help you calculate calorie counts faster.  Pay attention to calories in drinks. Low-calorie drinks include water and unsweetened drinks.  Pay attention to nutrition labels for "low fat" or "fat free" foods. These foods sometimes have the same amount of calories or more calories than the full fat versions. They also often have added sugar, starch, or salt, to make up for flavor that was removed with the fat.  Find a way of tracking calories that works for you. Get creative. Try different apps or programs if writing down calories does not work for you. What are some portion control tips?  Know how many calories are in a serving. This will help you know how many servings of a certain food you can have.  Use a measuring cup to measure serving sizes. You could also try weighing out portions on a kitchen scale. With time, you will be able to estimate serving sizes for some foods.  Take some time to put servings of different foods on your favorite plates, bowls, and cups so you know what a serving looks like.  Try not to eat straight from a bag or box. Doing this can lead to overeating. Put the amount you would like to eat in a cup or on a plate to make sure you are eating the right portion.  Use smaller plates, glasses, and bowls to prevent overeating.  Try not to multitask (for example, watch TV or use your computer) while eating. If it is time to eat, sit down at a table and enjoy your food. This will help you to know when you are full. It will also help you to be aware of what you are eating and how much you are eating. What are tips for following this plan? Reading food labels  Check the calorie count compared to the serving size. The serving size may be smaller than what you are used to eating.  Check the source of the calories. Make sure the food you are eating is high in vitamins and protein and low in saturated and trans  fats. Shopping  Read nutrition labels while you shop. This will help you make healthy decisions before you decide to purchase your food.  Make a grocery list and stick to it. Cooking  Try to cook your favorite foods in a healthier way. For example, try baking instead of frying.  Use low-fat dairy products. Meal planning  Use more fruits and vegetables. Half of your plate should be fruits and vegetables.  Include lean proteins like poultry and fish. How do I count calories when eating out?  Ask for smaller portion sizes.  Consider sharing  an entree and sides instead of getting your own entree.  If you get your own entree, eat only half. Ask for a box at the beginning of your meal and put the rest of your entree in it so you are not tempted to eat it.  If calories are listed on the menu, choose the lower calorie options.  Choose dishes that include vegetables, fruits, whole grains, low-fat dairy products, and lean protein.  Choose items that are boiled, broiled, grilled, or steamed. Stay away from items that are buttered, battered, fried, or served with cream sauce. Items labeled "crispy" are usually fried, unless stated otherwise.  Choose water, low-fat milk, unsweetened iced tea, or other drinks without added sugar. If you want an alcoholic beverage, choose a lower calorie option such as a glass of wine or light beer.  Ask for dressings, sauces, and syrups on the side. These are usually high in calories, so you should limit the amount you eat.  If you want a salad, choose a garden salad and ask for grilled meats. Avoid extra toppings like bacon, cheese, or fried items. Ask for the dressing on the side, or ask for olive oil and vinegar or lemon to use as dressing.  Estimate how many servings of a food you are given. For example, a serving of cooked rice is  cup or about the size of half a baseball. Knowing serving sizes will help you be aware of how much food you are eating at  restaurants. The list below tells you how big or small some common portion sizes are based on everyday objects: ? 1 oz--4 stacked dice. ? 3 oz--1 deck of cards. ? 1 tsp--1 die. ? 1 Tbsp-- a ping-pong ball. ? 2 Tbsp--1 ping-pong ball. ?  cup-- baseball. ? 1 cup--1 baseball. Summary  Calorie counting means keeping track of how many calories you eat and drink each day. If you eat fewer calories than your body needs, you should lose weight.  A healthy amount of weight to lose per week is usually 1-2 lb (0.5-0.9 kg). This usually means reducing your daily calorie intake by 500-750 calories.  The number of calories in a food can be found on a Nutrition Facts label. If a food does not have a Nutrition Facts label, try to look up the calories online or ask your dietitian for help.  Use your calories on foods and drinks that will fill you up, and not on foods and drinks that will leave you hungry.  Use smaller plates, glasses, and bowls to prevent overeating. This information is not intended to replace advice given to you by your health care provider. Make sure you discuss any questions you have with your health care provider. Document Revised: 12/27/2017 Document Reviewed: 03/09/2016 Elsevier Patient Education  [facility]) Exercise Recommendation  Being physically active is important to prevent heart disease and stroke, the nation's No. 1and No. 5killers. To improve overall cardiovascular health, we suggest at least 150 minutes per week of moderate exercise or 75 minutes per week of vigorous exercise (or a combination of moderate and vigorous activity). Thirty minutes a day, five times a week is an easy goal to remember. You will also experience benefits even if you divide your time into two or three segments of 10 to 15 minutes per day.  For people who would benefit from lowering their blood pressure or cholesterol, we recommend 40 minutes of aerobic  exercise of moderate to vigorous  intensity three to four times a week to lower the risk for heart attack and stroke.  Physical activity is anything that makes you move your body and burn calories.  This includes things like climbing stairs or playing sports. Aerobic exercises benefit your heart, and include walking, jogging, swimming or biking. Strength and stretching exercises are best for overall stamina and flexibility.  The simplest, positive change you can make to effectively improve your heart health is to start walking. It's enjoyable, free, easy, social and great exercise. A walking program is flexible and boasts high success rates because people can stick with it. It's easy for walking to become a regular and satisfying part of life.   For Overall Cardiovascular Health:  At least 30 minutes of moderate-intensity aerobic activity at least 5 days per week for a total of 150  OR   At least 25 minutes of vigorous aerobic activity at least 3 days per week for a total of 75 minutes; or a combination of moderate- and vigorous-intensity aerobic activity  AND   Moderate- to high-intensity muscle-strengthening activity at least 2 days per week for additional health benefits.  For Lowering Blood Pressure and Cholesterol  An average 40 minutes of moderate- to vigorous-intensity aerobic activity 3 or 4 times per week  What if I can't make it to the time goal? Something is always better than nothing! And everyone has to start somewhere. Even if you've been sedentary for years, today is the day you can begin to make healthy changes in your life. If you don't think you'll make it for 30 or 40 minutes, set a reachable goal for today. You can work up toward your overall goal by increasing your time as you get stronger. Don't let all-or-nothing thinking rob you of doing what you can every day.  Source:http://www.heart.org

## 2019-06-05 NOTE — Progress Notes (Signed)
Patient ID: Wendy Kent, female    DOB: 1971-03-05, 49 y.o.   MRN: [id number]  PCP: Delsa Grana, PA-C  Chief Complaint  Patient presents with  . Weight Check    Subjective:   Wendy Kent is a 49 y.o. female, presents to clinic with CC of the following:  HPI  Doing saxenda doing daily injections  Feels full but no N, V abd pain  Nutrition - watching what she's eating, drinking more water, drinks hot tea Exercising - doing weights and walking up and down the steps at her apartment complex  3 pounds weight loss from visit 1 month ago Wt Readings from Last 5 Encounters:  06/05/19 228 lb 11.2 oz (103.7 kg)  05/05/19 231 lb 6.4 oz (105 kg)  01/20/19 229 lb (103.9 kg)  01/14/19 223 lb 8 oz (101.4 kg)  12/01/18 225 lb 8 oz (102.3 kg)   At home 227 lbs this am -will do next visit virtually she will compare her weight 1 month from now to her 227 from today at home She complains of acute left elbow and arm pain she states she has left elbow bicep disomfort after working out lifting weights, onset yesterday feels little bit sore and swollen, no redness decreased range of motion.   Patient Active Problem List   Diagnosis Date Noted  . Lymphedema 01/21/2019  . Varicose veins of leg with pain, bilateral 10/14/2018  . Obesity (BMI 35.0-39.9 without comorbidity) 09/12/2017  . Hypokalemia 01/11/2016  . Hepatomegaly 12/22/2015  . Cardiomegaly 12/22/2015  . Lactose intolerance 09/26/2015  . IBS (irritable bowel syndrome) 09/26/2015  . Multinodular goiter 09/28/2014  . Hyperthyroidism, subclinical 09/28/2014  . Allergic rhinitis, seasonal 09/28/2014  . Iron deficiency anemia 12/29/2012  . Hypertension goal BP (blood pressure) < 140/90 12/29/2012  . Anemia 12/29/2012      Current Outpatient Medications:  .  cloNIDine (CATAPRES) 0.1 MG tablet, Take 1 tablet (0.1 mg total) by mouth at bedtime., Disp: 90 tablet, Rfl: 1 .  fluticasone (FLONASE) 50 MCG/ACT nasal spray, PLACE 2  SPRAYS INTO BOTH NOSTRILS DAILY AS NEEDED., Disp: 48 mL, Rfl: 3 .  Insulin Pen Needle (B-D UF III MINI PEN NEEDLES) 31G X 5 MM MISC, For use with Saxenda, one injection subcutaneously once a day, Disp: 30 each, Rfl: 5 .  Liraglutide -Weight Management (SAXENDA) 18 MG/3ML SOPN, Inject 0.6 mg into the skin daily. x 1 week, then 1.2 mg daily x 1 week, then 1.8 mg daily x 1 week, then 2.4 mg daily x 1 week, then 3 mg daily, Disp: 3 mL, Rfl: 0 .  meloxicam (MOBIC) 7.5 MG tablet, Take 1 tablet (7.5 mg total) by mouth daily., Disp: 30 tablet, Rfl: 0   Allergies  Allergen Reactions  . Percocet [Oxycodone-Acetaminophen] Anaphylaxis     No family history on file.   Social History   Socioeconomic History  . Marital status: Single    Spouse name: Not on file  . Number of children: 4  . Years of education: 15  . Highest education level: Associate degree: academic program  Occupational History  . Occupation: TEFL teacher    Comment: [employer]  Tobacco Use  . Smoking status: Never Smoker  . Smokeless tobacco: Never Used  Substance and Sexual Activity  . Alcohol use: Yes    Alcohol/week: 0.0 standard drinks    Comment: socially  . Drug use: No  . Sexual activity: Not Currently    Birth control/protection: Surgical  Other Topics Concern  .  Not on file  Social History Narrative  . Not on file   Social Determinants of Health   Financial Resource Strain:   . Difficulty of Paying Living Expenses: Not on file  Food Insecurity:   . Worried About Charity fundraiser in the Last Year: Not on file  . Ran Out of Food in the Last Year: Not on file  Transportation Needs:   . Lack of Transportation (Medical): Not on file  . Lack of Transportation (Non-Medical): Not on file  Physical Activity:   . Days of Exercise per Week: Not on file  . Minutes of Exercise per Session: Not on file  Stress:   . Feeling of Stress : Not on file  Social Connections:   . Frequency of Communication with  Friends and Family: Not on file  . Frequency of Social Gatherings with Friends and Family: Not on file  . Attends Religious Services: Not on file  . Active Member of Clubs or Organizations: Not on file  . Attends Archivist Meetings: Not on file  . Marital Status: Not on file  Intimate Partner Violence:   . Fear of Current or Ex-Partner: Not on file  . Emotionally Abused: Not on file  . Physically Abused: Not on file  . Sexually Abused: Not on file    Chart Review Today: I personally reviewed active problem list, medication list, allergies, family history, social history, health maintenance, notes from last encounter, lab results, imaging with the patient/caregiver today.   Review of Systems  Constitutional: Negative.   HENT: Negative.   Eyes: Negative.   Respiratory: Negative.   Cardiovascular: Negative.   Gastrointestinal: Negative.   Endocrine: Negative.   Genitourinary: Negative.   Musculoskeletal: Negative.   Skin: Negative.   Allergic/Immunologic: Negative.   Neurological: Negative.   Hematological: Negative.   Psychiatric/Behavioral: Negative.   All other systems reviewed and are negative.      Objective:   Vitals:   06/05/19 1519  BP: 122/86  Pulse: 90  Resp: 16  Temp: (!) 97.1 F (36.2 C)  TempSrc: Temporal  SpO2: 99%  Weight: 228 lb 11.2 oz (103.7 kg)  Height: 5\' 2"  (1.575 m)    Body mass index is 41.83 kg/m.  Physical Exam Vitals and nursing note reviewed.  Constitutional:      General: She is not in acute distress.    Appearance: Normal appearance. She is well-developed. She is obese. She is not ill-appearing, toxic-appearing or diaphoretic.  HENT:     Head: Normocephalic and atraumatic.     Nose: Nose normal.  Eyes:     General:        Right eye: No discharge.        Left eye: No discharge.     Conjunctiva/sclera: Conjunctivae normal.  Neck:     Trachea: No tracheal deviation.  Cardiovascular:     Rate and Rhythm: Normal rate  and regular rhythm.  Pulmonary:     Effort: Pulmonary effort is normal. No respiratory distress.     Breath sounds: No stridor.  Musculoskeletal:        General: Normal range of motion.     Left upper arm: Normal.     Left elbow: Normal. No swelling, deformity, effusion or lacerations. Normal range of motion. No tenderness.  Skin:    General: Skin is warm and dry.     Findings: No rash.  Neurological:     Mental Status: She is alert.  Motor: No abnormal muscle tone.     Coordination: Coordination normal.  Psychiatric:        Behavior: Behavior normal.      Results for orders placed or performed in visit on 06/02/19  Novel Coronavirus, NAA ([facility])   Specimen: Oropharyngeal(OP) collection in vial transport medium   OROPHARYNGEA  TESTING  Result Value Ref Range   SARS-CoV-2, NAA Not Detected Not Detected        Assessment & Plan:      ICD-10-CM   1. Class 3 severe obesity with serious comorbidity and body mass index (BMI) of 40.0 to 44.9 in adult, unspecified obesity type (HCC)  E66.01 Liraglutide -Weight Management (SAXENDA) 18 MG/3ML SOPN   Z68.41    Tolerating restarting Saxenda refill 3 mg dose daily, 3 mo supply, counseled diet and exercise, decrease calories, increase aerobic exercise 1 mo f/up  2. Left elbow pain  M25.522 meloxicam (MOBIC) 7.5 MG tablet   Likely a mild strain encourage her to rest from lifting weights, can use Mobic, rice therapy        Delsa Grana, PA-C 06/05/19 3:44 PM

## 2019-07-14 ENCOUNTER — Ambulatory Visit (INDEPENDENT_AMBULATORY_CARE_PROVIDER_SITE_OTHER): Payer: BC Managed Care – PPO | Admitting: Family Medicine

## 2019-07-14 ENCOUNTER — Encounter: Payer: Self-pay | Admitting: Family Medicine

## 2019-07-14 ENCOUNTER — Other Ambulatory Visit: Payer: Self-pay

## 2019-07-14 VITALS — BP 122/82 | HR 86 | Temp 98.9°F | Resp 14 | Ht 62.0 in | Wt 229.8 lb

## 2019-07-14 DIAGNOSIS — Z13220 Encounter for screening for lipoid disorders: Secondary | ICD-10-CM

## 2019-07-14 DIAGNOSIS — E0590 Thyrotoxicosis, unspecified without thyrotoxic crisis or storm: Secondary | ICD-10-CM | POA: Diagnosis not present

## 2019-07-14 DIAGNOSIS — Z0000 Encounter for general adult medical examination without abnormal findings: Secondary | ICD-10-CM | POA: Diagnosis not present

## 2019-07-14 DIAGNOSIS — E042 Nontoxic multinodular goiter: Secondary | ICD-10-CM

## 2019-07-14 DIAGNOSIS — E010 Iodine-deficiency related diffuse (endemic) goiter: Secondary | ICD-10-CM

## 2019-07-14 DIAGNOSIS — R499 Unspecified voice and resonance disorder: Secondary | ICD-10-CM

## 2019-07-14 DIAGNOSIS — I10 Essential (primary) hypertension: Secondary | ICD-10-CM

## 2019-07-14 DIAGNOSIS — Z9889 Other specified postprocedural states: Secondary | ICD-10-CM

## 2019-07-14 DIAGNOSIS — Z6841 Body Mass Index (BMI) 40.0 and over, adult: Secondary | ICD-10-CM

## 2019-07-14 LAB — LIPID PANEL
Cholesterol: 139 mg/dL (ref <200)
HDL: 56 mg/dL (ref >=50)
LDL Cholesterol (Calc): 66 mg/dL (calc)
Non-HDL Cholesterol (Calc): 83 mg/dL (calc) (ref <130)
Total CHOL/HDL Ratio: 2.5 (calc) (ref <5.0)
Triglycerides: 85 mg/dL (ref <150)

## 2019-07-14 NOTE — Patient Instructions (Signed)
Preventive Care 40-49 Years Old, Female Preventive care refers to visits with your health care provider and lifestyle choices that can promote health and wellness. This includes:  A yearly physical exam. This may also be called an annual well check.  Regular dental visits and eye exams.  Immunizations.  Screening for certain conditions.  Healthy lifestyle choices, such as eating a healthy diet, getting regular exercise, not using drugs or products that contain nicotine and tobacco, and limiting alcohol use. What can I expect for my preventive care visit? Physical exam Your health care provider will check your:  Height and weight. This may be used to calculate body mass index (BMI), which tells if you are at a healthy weight.  Heart rate and blood pressure.  Skin for abnormal spots. Counseling Your health care provider may ask you questions about your:  Alcohol, tobacco, and drug use.  Emotional well-being.  Home and relationship well-being.  Sexual activity.  Eating habits.  Work and work environment.  Method of birth control.  Menstrual cycle.  Pregnancy history. What immunizations do I need?  Influenza (flu) vaccine  This is recommended every year. Tetanus, diphtheria, and pertussis (Tdap) vaccine  You may need a Td booster every 10 years. Varicella (chickenpox) vaccine  You may need this if you have not been vaccinated. Zoster (shingles) vaccine  You may need this after age 60. Measles, mumps, and rubella (MMR) vaccine  You may need at least one dose of MMR if you were born in 1957 or later. You may also need a second dose. Pneumococcal conjugate (PCV13) vaccine  You may need this if you have certain conditions and were not previously vaccinated. Pneumococcal polysaccharide (PPSV23) vaccine  You may need one or two doses if you smoke cigarettes or if you have certain conditions. Meningococcal conjugate (MenACWY) vaccine  You may need this if you  have certain conditions. Hepatitis A vaccine  You may need this if you have certain conditions or if you travel or work in places where you may be exposed to hepatitis A. Hepatitis B vaccine  You may need this if you have certain conditions or if you travel or work in places where you may be exposed to hepatitis B. Haemophilus influenzae type b (Hib) vaccine  You may need this if you have certain conditions. Human papillomavirus (HPV) vaccine  If recommended by your health care provider, you may need three doses over 6 months. You may receive vaccines as individual doses or as more than one vaccine together in one shot (combination vaccines). Talk with your health care provider about the risks and benefits of combination vaccines. What tests do I need? Blood tests  Lipid and cholesterol levels. These may be checked every 5 years, or more frequently if you are over 50 years old.  Hepatitis C test.  Hepatitis B test. Screening  Lung cancer screening. You may have this screening every year starting at age 55 if you have a 30-pack-year history of smoking and currently smoke or have quit within the past 15 years.  Colorectal cancer screening. All adults should have this screening starting at age 50 and continuing until age 75. Your health care provider may recommend screening at age 45 if you are at increased risk. You will have tests every 1-10 years, depending on your results and the type of screening test.  Diabetes screening. This is done by checking your blood sugar (glucose) after you have not eaten for a while (fasting). You may have this   done every 1-3 years.  Mammogram. This may be done every 1-2 years. Talk with your health care provider about when you should start having regular mammograms. This may depend on whether you have a family history of breast cancer.  BRCA-related cancer screening. This may be done if you have a family history of breast, ovarian, tubal, or peritoneal  cancers.  Pelvic exam and Pap test. This may be done every 3 years starting at age 21. Starting at age 30, this may be done every 5 years if you have a Pap test in combination with an HPV test. Other tests  Sexually transmitted disease (STD) testing.  Bone density scan. This is done to screen for osteoporosis. You may have this scan if you are at high risk for osteoporosis. Follow these instructions at home: Eating and drinking  Eat a diet that includes fresh fruits and vegetables, whole grains, lean protein, and low-fat dairy.  Take vitamin and mineral supplements as recommended by your health care provider.  Do not drink alcohol if: ? Your health care provider tells you not to drink. ? You are pregnant, may be pregnant, or are planning to become pregnant.  If you drink alcohol: ? Limit how much you have to 0-1 drink a day. ? Be aware of how much alcohol is in your drink. In the [place] one drink equals one 12 oz bottle of beer (355 mL), one 5 oz glass of wine (148 mL), or one 1 oz glass of hard liquor (44 mL). Lifestyle  Take daily care of your teeth and gums.  Stay active. Exercise for at least 30 minutes on 5 or more days each week.  Do not use any products that contain nicotine or tobacco, such as cigarettes, e-cigarettes, and chewing tobacco. If you need help quitting, ask your health care provider.  If you are sexually active, practice safe sex. Use a condom or other form of birth control (contraception) in order to prevent pregnancy and STIs (sexually transmitted infections).  If told by your health care provider, take low-dose aspirin daily starting at age 50. What's next?  Visit your health care provider once a year for a well check visit.  Ask your health care provider how often you should have your eyes and teeth checked.  Stay up to date on all vaccines. This information is not intended to replace advice given to you by your health care provider. Make sure you  discuss any questions you have with your health care provider. Document Revised: 12/19/2017 Document Reviewed: 12/19/2017 Elsevier Patient Education  2020 [employer]   Preventing Osteoporosis, Adult Osteoporosis is a condition that causes the bones to lose density. This means that the bones become thinner, and the normal spaces in bone tissue become larger. Low bone density can make the bones weak and cause them to break more easily. Osteoporosis cannot always be prevented, but you can take steps to lower your risk of developing this condition. How can this condition affect me? If you develop osteoporosis, you will be more likely to break bones in your wrist, spine, or hip. Even a minor accident or injury can be enough to break weak bones. The bones will also be slower to heal. Osteoporosis can cause other problems as well, such as a stooped posture or trouble with movement. Osteoporosis can occur with aging. As you get older, you may lose bone tissue more quickly, or it may be replaced more slowly. Osteoporosis is more likely to develop if you have   poor nutrition or do not get enough calcium or vitamin D. Other lifestyle factors can also play a role. By eating a well-balanced diet and making lifestyle changes, you can help keep your bones strong and healthy, lowering your chances of developing osteoporosis. What can increase my risk? The following factors may make you more likely to develop osteoporosis:  Having a family history of the condition.  Having poor nutrition or not getting enough calcium or vitamin D.  Using certain medicines, such as steroid medicines or antiseizure medicines.  Being any of the following: ? 50 years of age or older. ? Female. ? A woman who has gone through menopause (is postmenopausal). ? White (Caucasian) or of Asian descent.  Smoking or having a history of smoking.  Not being physically active (being sedentary).  Having a small body frame. What  actions can I take to prevent this?  Get enough calcium   Make sure you get enough calcium every day. Calcium is the most important mineral for bone health. Most people can get enough calcium from their diet, but supplements may be recommended for people who are at risk for osteoporosis. Follow these guidelines: ? If you are age 50 or younger, aim to get 1,000 mg of calcium every day. ? If you are older than age 50, aim to get 1,200 mg of calcium every day.  Good sources of calcium include: ? Dairy products, such as low-fat or nonfat milk, cheese, and yogurt. ? Dark green leafy vegetables, such as bok choy and broccoli. ? Foods that have had calcium added to them (calcium-fortified foods), such as orange juice, cereal, bread, soy beverages, and tofu products. ? Nuts, such as almonds.  Check nutrition labels to see how much calcium is in a food or drink. Get enough vitamin D  Try to get enough vitamin D every day. Vitamin D is the most essential vitamin for bone health. It helps the body absorb calcium. Follow these guidelines for how much vitamin D to get from food: ? If you are age 70 or younger, aim to get at least 600 international units (IU) every day. Your health care provider may suggest more. ? If you are older than age 70, aim to get at least 800 international units every day. Your health care provider may suggest more.  Good sources of vitamin D in your diet include: ? Egg yolks. ? Oily fish, such as salmon, sardines, and tuna. ? Milk and cereal fortified with vitamin D.  Your body also makes vitamin D when you are out in the sun. Exposing the bare skin on your face, arms, legs, or back to the sun for no more than 30 minutes a day, 2 times a week is more than enough. Beyond that, make sure you use sunblock to protect your skin from sunburn, which increases your risk for skin cancer. Exercise  Stay active and get exercise every day.  Ask your health care provider what types of  exercise are best for you. Weight-bearing and strength-building activities are important for building and maintaining healthy bones. Some examples of these types of activities include: ? Walking and hiking. ? Jogging and running. ? Dancing. ? Gym exercises. ? Lifting weights. ? Tennis and racquetball. ? Climbing stairs. ? Aerobics. Make other lifestyle changes  Do not use any products that contain nicotine or tobacco, such as cigarettes, e-cigarettes, and chewing tobacco. If you need help quitting, ask your health care provider.  Lose weight if you are overweight.    If you drink alcohol: ? Limit how much you use to:  0-1 drink a day for nonpregnant women.  0-2 drinks a day for men. ? Be aware of how much alcohol is in your drink. In the [place] one drink equals one 12 oz bottle of beer (355 mL), one 5 oz glass of wine (148 mL), or one 1 oz glass of hard liquor (44 mL). Where to find support If you need help making changes to prevent osteoporosis, talk with your health care provider. You can ask for a referral to a diet and nutrition specialist (dietitian) and a physical therapist. Where to find more information Learn more about osteoporosis from:  NIH Osteoporosis and Related Bone Diseases National Resource Center: www.bones.nih.gov  [place] Office on Women's Health: www.womenshealth.gov  National Osteoporosis Foundation: www.nof.org Summary  Osteoporosis is a condition that causes weak bones that are more likely to break.  Eat a healthy diet, making sure you get enough calcium and vitamin D, and stay active by getting regular exercise to help prevent osteoporosis.  Other ways to reduce your risk of osteoporosis include maintaining a healthy weight and avoiding alcohol and products that contain nicotine or tobacco. This information is not intended to replace advice given to you by your health care provider. Make sure you discuss any questions you have with your health care  provider. Document Revised: 11/07/2018 Document Reviewed: 11/07/2018 [employer] Patient Education  2020 [employer]  

## 2019-07-14 NOTE — Progress Notes (Signed)
Patient: Wendy Kent, Female    DOB: Nov 01, 1970, 49 y.o.   MRN: [id number] Delsa Grana, PA-C Visit Date: 07/14/2019  Today's Provider: Delsa Grana, PA-C   Chief Complaint  Patient presents with  . Annual Exam   Subjective:   Annual physical exam:  Wendy Kent is a 49 y.o. female who presents today for complete physical exam:  Exercise/Activity:   Walking 3x a week Diet/nutrition:  Working on diet, wants to do a colon wash out procedure, asks about it today - she has been trying to work on healthier foods Sleep: sleeping well   7 pm clonidine 0.1 mg at bedtime for palpitations if not flashes her symptoms have been well controlled with this she has not had any side effects or concerns no near syncope, lightheadedness, rebound hypertension  She is slightly concerned about some dimpling and discomfort with her surgical incisions/scars from breast reduction a few years ago she asks about that today she otherwise is due for mammogram and breast exam and has had no noticeable changes other than postop changes  She also has goiter which has been worked up in the past with negative biopsies with no further imaging needed, she has hypothyroid disease and a family member also has this, last thyroid labs were done a month ago and were normal the labs prior to that TSH was low but T4 was normal.  He asks about the size of her goiter she reports that she has had some voice changes but it has improved lately she is hesitant when asking about having it followed up, we discussed ENT referral she denies any dysphagia or any worsening change to her voice, she is not having any difficulty breathing has no stridor no choking episodes.  She declined any ENT referral at this time  Obesity: Wt Readings from Last 5 Encounters:  07/14/19 229 lb 12.8 oz (104.2 kg)  06/05/19 228 lb 11.2 oz (103.7 kg)  05/05/19 231 lb 6.4 oz (105 kg)  01/20/19 229 lb (103.9 kg)  01/14/19 223 lb 8 oz (101.4 kg)    BMI Readings from Last 5 Encounters:  07/14/19 42.03 kg/m  06/05/19 41.83 kg/m  05/05/19 42.32 kg/m  01/20/19 41.88 kg/m  01/14/19 40.88 kg/m   No improvement on BMi, but she is very motivated right now, she has been doing visits for medical weight management and have been getting prescribed Saxenda she noticed that she began to have some lower extremity edema she even held the Saxenda few times in the ankle and lower extremity edema improved and then she tried [place] again and it returned so she has not continued the medication and has not had any further weight loss.   USPSTF grade A and B recommendations - reviewed and addressed today  Depression:  Phq 9 completed today by patient, was reviewed by me with patient in the room PHQ score is neg, pt feels good/well PHQ 2/9 Scores 07/14/2019 06/05/2019 05/05/2019 01/14/2019  PHQ - 2 Score 0 0 0 0  PHQ- 9 Score 0 1 0 0   Depression screen [facility] 2/9 07/14/2019 06/05/2019 05/05/2019 01/14/2019 12/01/2018  Decreased Interest 0 0 0 0 0  Down, Depressed, Hopeless 0 0 0 0 0  PHQ - 2 Score 0 0 0 0 0  Altered sleeping 0 0 0 0 0  Tired, decreased energy 0 0 0 0 0  Change in appetite 0 1 0 0 0  Feeling bad or failure about yourself  0 0 0 0  0  Trouble concentrating 0 0 0 0 0  Moving slowly or fidgety/restless 0 0 0 0 0  Suicidal thoughts 0 0 0 0 0  PHQ-9 Score 0 1 0 0 0  Difficult doing work/chores Not difficult at all Not difficult at all Not difficult at all Not difficult at all Not difficult at all  Some recent data might be hidden    Alcohol screening:   Office Visit from 07/14/2019 in [facility]  AUDIT-C Score  0      Immunizations and Health Maintenance: Health Maintenance  Topic Date Due  . MAMMOGRAM  11/23/2015  . PAP SMEAR-Modifier  11/09/2017  . INFLUENZA VACCINE  07/22/2019 (Originally 11/22/2018)  . TETANUS/TDAP  06/27/2022  . COLONOSCOPY  03/27/2023  . HIV Screening  Completed     Hep C Screening:   Not indicated  STD testing and prevention (HIV/chl/gon/syphilis):  see above, no additional testing desired by pt today   Intimate partner violence: feels safe   Sexual History/Pain during Intercourse: Single  Menstrual History/LMP/Abnormal Bleeding:  None/hysterectomy  No LMP recorded. Patient has had a hysterectomy.  Incontinence Symptoms: rare stress incontinence  Breast cancer:  Last Mammogram: see HM list above - last was a few years ago, had breast reduction surgery, ordered - [facility]  BRCA gene screening: no known Hx  Cervical cancer screening:  Hysterectomy  Pt no family hx of cancers - breast, ovarian, uterine, colon:     Osteoporosis:   Discussion on osteoporosis per age, including high calcium and vitamin D supplementation, weight bearing exercises Pt is not supplementing with daily calcium/Vit D. Hysterectomy 2014, hot flashes over the past couple years  Skin cancer:  Hx of skin CA -  NO  Discussed atypical lesions   Colorectal cancer:   Colonoscopy is UTD due 2024 Discussed concerning signs and sx of CRC, pt denies bowel changes, melena  Lung cancer:   Low Dose CT Chest recommended if Age 61-80 years, 30 pack-year currently smoking OR have quit w/in 15years. Patient does not qualify.    Social History   Tobacco Use  . Smoking status: Never Smoker  . Smokeless tobacco: Never Used  Substance Use Topics  . Alcohol use: Yes    Alcohol/week: 0.0 standard drinks    Comment: socially  . Drug use: No       [facility] Visit from 07/14/2019 in [facility]  AUDIT-C Score  0      History reviewed. No pertinent family history.   Blood pressure/Hypertension: BP Readings from Last 3 Encounters:  06/05/19 122/86  05/05/19 138/88  01/20/19 (!) 154/92    Weight/Obesity: Wt Readings from Last 3 Encounters:  07/14/19 229 lb 12.8 oz (104.2 kg)  06/05/19 228 lb 11.2 oz (103.7 kg)  05/05/19 231 lb 6.4 oz (105 kg)   BMI Readings from Last  3 Encounters:  07/14/19 42.03 kg/m  06/05/19 41.83 kg/m  05/05/19 42.32 kg/m     Lipids:  Lab Results  Component Value Date   CHOL 130 03/06/2017   CHOL 120 11/10/2014   Lab Results  Component Value Date   HDL 62 03/06/2017   HDL 68 11/10/2014   Lab Results  Component Value Date   LDLCALC 54 03/06/2017   LDLCALC 38 11/10/2014   Lab Results  Component Value Date   TRIG 65 03/06/2017   TRIG 71 11/10/2014   Lab Results  Component Value Date   CHOLHDL 2.1 03/06/2017   CHOLHDL 1.8 11/10/2014  No results found for: LDLDIRECT Based on the results of lipid panel his/her cardiovascular risk factor ( using [facility] )  in the next 10 years is: The 10-year ASCVD risk score Mikey Bussing DC Brooke Bonito., et al., 2013) is: 1.3%   Values used to calculate the score:     Age: 15 years     Sex: Female     Is Non-Hispanic African American: Yes     Diabetic: No     Tobacco smoker: No     Systolic Blood Pressure: 579 mmHg     Is BP treated: Yes     HDL Cholesterol: 62 mg/dL     Total Cholesterol: 130 mg/dL Glucose:  Glucose, Bld  Date Value Ref Range Status  05/05/2019 89 65 - 99 mg/dL Final    Comment:    .            Fasting reference interval .   01/14/2019 91 65 - 99 mg/dL Final    Comment:    .            Fasting reference interval .   07/30/2017 90 65 - 99 mg/dL Final   Hypertension: BP Readings from Last 3 Encounters:  06/05/19 122/86  05/05/19 138/88  01/20/19 (!) 154/92   Obesity: Wt Readings from Last 3 Encounters:  07/14/19 229 lb 12.8 oz (104.2 kg)  06/05/19 228 lb 11.2 oz (103.7 kg)  05/05/19 231 lb 6.4 oz (105 kg)   BMI Readings from Last 3 Encounters:  07/14/19 42.03 kg/m  06/05/19 41.83 kg/m  05/05/19 42.32 kg/m      Advanced Care Planning:  A voluntary discussion about advance care planning including the explanation and discussion of advance directives.   Discussed health care proxy and Living will, and the patient was able to identify a  health care proxy as her son Neita Carp .   Patient does not have a living will at present time.   Social History      She        Social History   Socioeconomic History  . Marital status: Single    Spouse name: Not on file  . Number of children: 4  . Years of education: 36  . Highest education level: Associate degree: academic program  Occupational History  . Occupation: TEFL teacher    Comment: [employer]  Tobacco Use  . Smoking status: Never Smoker  . Smokeless tobacco: Never Used  Substance and Sexual Activity  . Alcohol use: Yes    Alcohol/week: 0.0 standard drinks    Comment: socially  . Drug use: No  . Sexual activity: Not Currently    Birth control/protection: Surgical  Other Topics Concern  . Not on file  Social History Narrative  . Not on file   Social Determinants of Health   Financial Resource Strain:   . Difficulty of Paying Living Expenses:   Food Insecurity:   . Worried About Charity fundraiser in the Last Year:   . Arboriculturist in the Last Year:   Transportation Needs:   . Film/video editor (Medical):   Marland Kitchen Lack of Transportation (Non-Medical):   Physical Activity:   . Days of Exercise per Week:   . Minutes of Exercise per Session:   Stress:   . Feeling of Stress :   Social Connections:   . Frequency of Communication with Friends and Family:   . Frequency of Social Gatherings with Friends and Family:   . Attends  Religious Services:   . Active Member of Clubs or Organizations:   . Attends Archivist Meetings:   Marland Kitchen Marital Status:     Family History       History reviewed. No pertinent family history.  Patient Active Problem List   Diagnosis Date Noted  . Lymphedema 01/21/2019  . Varicose veins of leg with pain, bilateral 10/14/2018  . Obesity (BMI 35.0-39.9 without comorbidity) 09/12/2017  . Hypokalemia 01/11/2016  . Hepatomegaly 12/22/2015  . Cardiomegaly 12/22/2015  . Lactose intolerance 09/26/2015  . IBS  (irritable bowel syndrome) 09/26/2015  . Multinodular goiter 09/28/2014  . Hyperthyroidism, subclinical 09/28/2014  . Allergic rhinitis, seasonal 09/28/2014  . Iron deficiency anemia 12/29/2012  . Hypertension goal BP (blood pressure) < 140/90 12/29/2012  . Anemia 12/29/2012    Past Surgical History:  Procedure Laterality Date  . ABDOMINAL HYSTERECTOMY     patient still has ovaries  . BREAST REDUCTION SURGERY Bilateral 08/05/2017   Procedure: BREAST REDUCTION WITH LIPOSUCTION;  Surgeon: Cristine Polio, MD;  Location: [facility];  Service: Plastics;  Laterality: Bilateral;  . CHOLECYSTECTOMY    . COLONOSCOPY WITH PROPOFOL N/A 03/26/2018   Procedure: COLONOSCOPY WITH PROPOFOL;  Surgeon: Jonathon Bellows, MD;  Location: [facility] ENDOSCOPY;  Service: Gastroenterology;  Laterality: N/A;  . ESOPHAGOGASTRODUODENOSCOPY (EGD) WITH PROPOFOL N/A 03/26/2018   Procedure: ESOPHAGOGASTRODUODENOSCOPY (EGD) WITH PROPOFOL;  Surgeon: Jonathon Bellows, MD;  Location: [facility] ENDOSCOPY;  Service: Gastroenterology;  Laterality: N/A;  . GIVENS CAPSULE STUDY N/A 04/14/2018   Procedure: GIVENS CAPSULE STUDY;  Surgeon: Jonathon Bellows, MD;  Location: [facility] ENDOSCOPY;  Service: Gastroenterology;  Laterality: N/A;  . SHOULDER ARTHROSCOPY Right   . TUBAL LIGATION       Current Outpatient Medications:  .  cloNIDine (CATAPRES) 0.1 MG tablet, Take 1 tablet (0.1 mg total) by mouth at bedtime., Disp: 90 tablet, Rfl: 1 .  fluticasone (FLONASE) 50 MCG/ACT nasal spray, PLACE 2 SPRAYS INTO BOTH NOSTRILS DAILY AS NEEDED., Disp: 48 mL, Rfl: 3 .  meloxicam (MOBIC) 7.5 MG tablet, Take 1 tablet (7.5 mg total) by mouth daily., Disp: 30 tablet, Rfl: 0 .  Insulin Pen Needle (B-D UF III MINI PEN NEEDLES) 31G X 5 MM MISC, For use with Saxenda, one injection subcutaneously once a day, Disp: 30 each, Rfl: 5 .  Liraglutide -Weight Management (SAXENDA) 18 MG/3ML SOPN, Inject 0.6 mg into the skin daily. x 1 week, then 1.2 mg daily x 1 week,  then 1.8 mg daily x 1 week, then 2.4 mg daily x 1 week, then 3 mg daily (Patient not taking: Reported on 07/14/2019), Disp: 3 mL, Rfl: 0 .  Liraglutide -Weight Management (SAXENDA) 18 MG/3ML SOPN, Inject 3 mg into the skin daily. (Patient not taking: Reported on 07/14/2019), Disp: 45 mL, Rfl: 1  Allergies  Allergen Reactions  . Percocet [Oxycodone-Acetaminophen] Anaphylaxis    Patient Care Team: Delsa Grana, PA-C as PCP - General (Family Medicine) Cristine Polio, MD as Consulting Physician (Plastic Surgery)  Review of Systems  Constitutional: Negative.   HENT: Negative.   Eyes: Negative.   Respiratory: Negative.   Cardiovascular: Negative.   Gastrointestinal: Negative.   Endocrine: Negative.   Genitourinary: Negative.   Musculoskeletal: Negative.   Skin: Negative.   Allergic/Immunologic: Negative.   Neurological: Negative.   Hematological: Negative.   Psychiatric/Behavioral: Negative.   All other systems reviewed and are negative.    I personally reviewed active problem list, medication list, allergies, family history, social history, health maintenance, notes from last  encounter, lab results, imaging with the patient/caregiver today.        Objective:   Vitals:  . Today's Vitals   07/14/19 1514  BP: 122/82  Pulse: 86  Resp: 14  Temp: 98.9 F (37.2 C)  Weight: 229 lb 12.8 oz (104.2 kg)  Height: 5' 2" (1.575 m)   Body mass index is 42.03 kg/m.   Body mass index is 42.03 kg/m.  Physical Exam Vitals and nursing note reviewed.  Constitutional:      General: She is not in acute distress.    Appearance: Normal appearance. She is well-developed. She is obese. She is not ill-appearing, toxic-appearing or diaphoretic.  HENT:     Head: Normocephalic and atraumatic.     Right Ear: External ear normal.     Left Ear: External ear normal.     Nose: Nose normal.     Mouth/Throat:     Mouth: Mucous membranes are moist.     Pharynx: Uvula midline.  Eyes:      General: Lids are normal.        Right eye: No discharge.        Left eye: No discharge.     Conjunctiva/sclera: Conjunctivae normal.  Neck:     Thyroid: Thyromegaly present. No thyroid tenderness.     Trachea: Trachea and phonation normal. No tracheal deviation.     Comments: Phonation very mildly scratchy Cardiovascular:     Rate and Rhythm: Normal rate and regular rhythm.     Pulses: Normal pulses.          Radial pulses are 2+ on the right side and 2+ on the left side.       Posterior tibial pulses are 2+ on the right side and 2+ on the left side.     Heart sounds: Normal heart sounds. No murmur. No friction rub. No gallop.   Pulmonary:     Effort: Pulmonary effort is normal. No respiratory distress.     Breath sounds: Normal breath sounds. No stridor. No wheezing, rhonchi or rales.  Chest:     Chest wall: No mass or tenderness.     Breasts: Breasts are symmetrical.        Right: Normal. No swelling, mass, nipple discharge, skin change or tenderness.        Left: Normal. No swelling, mass, nipple discharge, skin change or tenderness.     Comments: Right lateral surgical scar and skin slightly full and bulging, no erythema/edema Abdominal:     General: Bowel sounds are normal. There is no distension.     Palpations: Abdomen is soft.     Tenderness: There is no abdominal tenderness. There is no guarding or rebound.  Musculoskeletal:        General: No deformity. Normal range of motion.     Cervical back: Normal range of motion and neck supple.     Right lower leg: No edema.     Left lower leg: No edema.  Lymphadenopathy:     Cervical: No cervical adenopathy.     Upper Body:     Right upper body: No supraclavicular, axillary or pectoral adenopathy.     Left upper body: No supraclavicular, axillary or pectoral adenopathy.  Skin:    General: Skin is warm and dry.     Capillary Refill: Capillary refill takes less than 2 seconds.     Coloration: Skin is not pale.     Findings:  No rash.  Neurological:  Mental Status: She is alert.     Motor: No weakness or abnormal muscle tone.     Coordination: Coordination normal.     Gait: Gait normal.  Psychiatric:        Mood and Affect: Mood normal.        Speech: Speech normal.        Behavior: Behavior normal.       Fall Risk: Fall Risk  07/14/2019 06/05/2019 05/05/2019 01/14/2019 12/01/2018  Falls in the past year? 0 0 0 0 0  Comment - - - - -  Number falls in past yr: 0 0 0 0 0  Injury with Fall? 0 0 0 0 0    Functional Status Survey: Is the patient deaf or have difficulty hearing?: No Does the patient have difficulty seeing, even when wearing glasses/contacts?: No Does the patient have difficulty concentrating, remembering, or making decisions?: No Does the patient have difficulty walking or climbing stairs?: No Does the patient have difficulty dressing or bathing?: No Does the patient have difficulty doing errands alone such as visiting a doctor's office or shopping?: No   Assessment & Plan:    CPE completed today  . USPSTF grade A and B recommendations reviewed with patient; age-appropriate recommendations, preventive care, screening tests, etc discussed and encouraged; healthy living encouraged; see AVS for patient education given to patient  . Discussed importance of 150 minutes of physical activity weekly, AHA exercise recommendations given to pt in AVS/handout  . Discussed importance of healthy diet:  eating lean meats and proteins, avoiding trans fats and saturated fats, avoid simple sugars and excessive carbs in diet, eat 6 servings of fruit/vegetables daily and drink plenty of water and avoid sweet beverages.    . Recommended pt to do annual eye exam and routine dental exams/cleanings  . Depression, alcohol, fall screening completed as documented above and per flowsheets  . Reviewed Health Maintenance: Health Maintenance  Topic Date Due  . MAMMOGRAM  11/23/2015  . PAP SMEAR-Modifier   11/09/2017  . INFLUENZA VACCINE  07/22/2019 (Originally 11/22/2018)  . TETANUS/TDAP  06/27/2022  . COLONOSCOPY  03/27/2023  . HIV Screening  Completed    . Immunizations: Immunization History  Administered Date(s) Administered  . Tdap 06/26/2012   Labs recently done for everything but cholesterol, last were a couple years ago and it was normal    ICD-10-CM   1. Adult general medical exam  Z00.00 Lipid panel   Physical complete breast exam done -she will call to set up mammogram  2. Screening for lipoid disorders  Z13.220 Lipid panel  3. Thyromegaly  E01.0   4. Hyperthyroidism, subclinical  E05.90    Recent labs her TSH and T4 were normal she continues to have no concerning symptoms of hyperthyroid  5. Change in voice  R49.9   6. Hypertension goal BP (blood pressure) < 140/90  I10    Blood pressure well controlled today  7. Multinodular goiter  E04.2    pt noted some concerns but then declined ENT referral for her goiter no dysphagia no choking no difficulty breathing some stable phonation changes  8. Class 3 severe obesity with body mass index (BMI) of 40.0 to 44.9 in adult, unspecified obesity type, unspecified whether serious comorbidity present ([address])  E66.01    Z68.41    Failed Saxenda encouraged healthy diet and exercise habits that are sustainable over long-term  9. S/P bilateral breast reduction  Z98.890 Ambulatory referral to Plastic Surgery   questions and concerns  about post op changes/incisions and irritation     Goiter, hyperthyroid some hx of voice change  - considering ENT evan but not right now  Delsa Grana, PA-C 07/14/19 3:24 PM  [facility]

## 2019-07-14 NOTE — Addendum Note (Signed)
Addended by: Delsa Grana on: 07/14/2019 05:07 PM   Modules accepted: Level of Service

## 2019-07-23 DIAGNOSIS — M9901 Segmental and somatic dysfunction of cervical region: Secondary | ICD-10-CM | POA: Diagnosis not present

## 2019-07-23 DIAGNOSIS — M62830 Muscle spasm of back: Secondary | ICD-10-CM | POA: Diagnosis not present

## 2019-07-23 DIAGNOSIS — M4602 Spinal enthesopathy, cervical region: Secondary | ICD-10-CM | POA: Diagnosis not present

## 2019-07-23 DIAGNOSIS — M50223 Other cervical disc displacement at C6-C7 level: Secondary | ICD-10-CM | POA: Diagnosis not present

## 2019-07-24 DIAGNOSIS — M9901 Segmental and somatic dysfunction of cervical region: Secondary | ICD-10-CM | POA: Diagnosis not present

## 2019-07-24 DIAGNOSIS — M4602 Spinal enthesopathy, cervical region: Secondary | ICD-10-CM | POA: Diagnosis not present

## 2019-07-24 DIAGNOSIS — M62830 Muscle spasm of back: Secondary | ICD-10-CM | POA: Diagnosis not present

## 2019-07-24 DIAGNOSIS — M50223 Other cervical disc displacement at C6-C7 level: Secondary | ICD-10-CM | POA: Diagnosis not present

## 2019-07-27 ENCOUNTER — Telehealth: Payer: Self-pay | Admitting: Family Medicine

## 2019-07-27 DIAGNOSIS — M4602 Spinal enthesopathy, cervical region: Secondary | ICD-10-CM | POA: Diagnosis not present

## 2019-07-27 DIAGNOSIS — M62830 Muscle spasm of back: Secondary | ICD-10-CM | POA: Diagnosis not present

## 2019-07-27 DIAGNOSIS — M9901 Segmental and somatic dysfunction of cervical region: Secondary | ICD-10-CM | POA: Diagnosis not present

## 2019-07-27 DIAGNOSIS — M50223 Other cervical disc displacement at C6-C7 level: Secondary | ICD-10-CM | POA: Diagnosis not present

## 2019-07-27 NOTE — Telephone Encounter (Signed)
Phone call to pt.  Stated she has not taken any more Saxenda, since early in March, due to side effect of swelling of feet and ankles.  Is requesting Rx for Adipex.  Reported she has not been going to Weight management clinic for awhile, due to [place].  Advised will send note to PCP with her request.  Pt. Agreed with plan.

## 2019-07-27 NOTE — Telephone Encounter (Signed)
Copied from [place] [id number]. Topic: General - Other >> Jul 27, 2019 10:46 AM Celene Kras wrote: Reason for CRM: Pt called stating that the saxenda is giving her a reaction. Pt is requesting to have adapex sent in for her instead. Please advise.    [id number] - [place], [place] - [address] [address] Phone: [phone] Fax: [phone] Not a 24 hour pharmacy; exact hours not known.

## 2019-07-27 NOTE — Telephone Encounter (Signed)
Appt needed?

## 2019-07-29 ENCOUNTER — Other Ambulatory Visit: Payer: Self-pay

## 2019-07-29 ENCOUNTER — Encounter: Payer: Self-pay | Admitting: Family Medicine

## 2019-07-29 DIAGNOSIS — R232 Flushing: Secondary | ICD-10-CM

## 2019-07-29 MED ORDER — CLONIDINE HCL 0.1 MG PO TABS: 0.1000 mg | ORAL_TABLET | Freq: Every day | ORAL | 1 refills | Status: DC

## 2019-07-30 DIAGNOSIS — M9901 Segmental and somatic dysfunction of cervical region: Secondary | ICD-10-CM | POA: Diagnosis not present

## 2019-07-30 DIAGNOSIS — M50223 Other cervical disc displacement at C6-C7 level: Secondary | ICD-10-CM | POA: Diagnosis not present

## 2019-07-30 DIAGNOSIS — M4602 Spinal enthesopathy, cervical region: Secondary | ICD-10-CM | POA: Diagnosis not present

## 2019-07-30 DIAGNOSIS — M62830 Muscle spasm of back: Secondary | ICD-10-CM | POA: Diagnosis not present

## 2019-08-03 DIAGNOSIS — M9901 Segmental and somatic dysfunction of cervical region: Secondary | ICD-10-CM | POA: Diagnosis not present

## 2019-08-03 DIAGNOSIS — M50223 Other cervical disc displacement at C6-C7 level: Secondary | ICD-10-CM | POA: Diagnosis not present

## 2019-08-03 DIAGNOSIS — M62830 Muscle spasm of back: Secondary | ICD-10-CM | POA: Diagnosis not present

## 2019-08-03 DIAGNOSIS — M4602 Spinal enthesopathy, cervical region: Secondary | ICD-10-CM | POA: Diagnosis not present

## 2019-08-05 ENCOUNTER — Emergency Department
Admission: EM | Admit: 2019-08-05 | Discharge: 2019-08-05 | Disposition: A | Discharge status: Home / Self Care | Payer: BC Managed Care – PPO | Attending: Emergency Medicine | Admitting: Emergency Medicine

## 2019-08-05 ENCOUNTER — Encounter: Payer: Self-pay | Admitting: *Deleted

## 2019-08-05 ENCOUNTER — Other Ambulatory Visit: Payer: Self-pay

## 2019-08-05 ENCOUNTER — Ambulatory Visit: Payer: Self-pay | Admitting: *Deleted

## 2019-08-05 ENCOUNTER — Emergency Department: Payer: BC Managed Care – PPO

## 2019-08-05 DIAGNOSIS — R609 Edema, unspecified: Secondary | ICD-10-CM

## 2019-08-05 DIAGNOSIS — R2243 Localized swelling, mass and lump, lower limb, bilateral: Secondary | ICD-10-CM | POA: Diagnosis present

## 2019-08-05 DIAGNOSIS — Z79899 Other long term (current) drug therapy: Secondary | ICD-10-CM | POA: Insufficient documentation

## 2019-08-05 DIAGNOSIS — R600 Localized edema: Secondary | ICD-10-CM | POA: Diagnosis not present

## 2019-08-05 DIAGNOSIS — R0602 Shortness of breath: Secondary | ICD-10-CM | POA: Diagnosis not present

## 2019-08-05 LAB — CBC
HCT: 36.7 % (ref 36.0–46.0)
Hemoglobin: 12 g/dL (ref 12.0–15.0)
MCH: 28.3 pg (ref 26.0–34.0)
MCHC: 32.7 g/dL (ref 30.0–36.0)
MCV: 86.6 fL (ref 80.0–100.0)
Platelets: 324 10*3/uL (ref 150–400)
RBC: 4.24 MIL/uL (ref 3.87–5.11)
RDW: 16.5 % — ABNORMAL HIGH (ref 11.5–15.5)
WBC: 9.3 10*3/uL (ref 4.0–10.5)
nRBC: 0 % (ref 0.0–0.2)

## 2019-08-05 LAB — BASIC METABOLIC PANEL
Anion gap: 6 (ref 5–15)
BUN: 15 mg/dL (ref 6–20)
CO2: 25 mmol/L (ref 22–32)
Calcium: 9 mg/dL (ref 8.9–10.3)
Chloride: 106 mmol/L (ref 98–111)
Creatinine, Ser: 0.65 mg/dL (ref 0.44–1.00)
GFR calc Af Amer: >60 mL/min (ref >60)
GFR calc non Af Amer: >60 mL/min (ref >60)
Glucose, Bld: 95 mg/dL (ref 70–99)
Potassium: 3.8 mmol/L (ref 3.5–5.1)
Sodium: 137 mmol/L (ref 135–145)

## 2019-08-05 LAB — TROPONIN I (HIGH SENSITIVITY): Troponin I (High Sensitivity): 4 ng/L (ref <18)

## 2019-08-05 LAB — BRAIN NATRIURETIC PEPTIDE: B Natriuretic Peptide: 55 pg/mL (ref 0.0–100.0)

## 2019-08-05 MED ORDER — HYDROCHLOROTHIAZIDE 25 MG PO TABS: 25.0000 mg | ORAL_TABLET | Freq: Every day | ORAL | 0 refills | Status: DC

## 2019-08-05 MED ORDER — FUROSEMIDE 40 MG PO TABS
40.0000 mg | ORAL_TABLET | Freq: Once | ORAL | Status: AC
  Administered 2019-08-05: 40 mg via ORAL
  Filled 2019-08-05: qty 1

## 2019-08-05 MED ORDER — POTASSIUM CHLORIDE CRYS ER 20 MEQ PO TBCR
40.0000 meq | EXTENDED_RELEASE_TABLET | Freq: Once | ORAL | Status: AC
  Administered 2019-08-05: 40 meq via ORAL
  Filled 2019-08-05: qty 2

## 2019-08-05 NOTE — ED Notes (Signed)
Patient states since her PC switched her HTZ to clonidine for her BP she has had swelling bilaterally lower extremities. Patient states lower legs swell during the day as soon as she gets up then dissipates when she goes to bed. Patient states she has been watching her sodium intake and drinking water along with working out. Patient has lower extremities bilaterally are 3+ pitting edema.

## 2019-08-05 NOTE — Discharge Instructions (Addendum)
Start the HCTZ tomorrow  It is very important to have your labs checked in 1 week when starting this medication  Elevate your legs as often as possible. Wear at least knee high compression socks.

## 2019-08-05 NOTE — Telephone Encounter (Signed)
Pt called with complaints of bilateral leg swelling from her calves to her feet, R>L; she says this has been going on for a month, and was discussed in her physical; the pt says she has been having SOB the past 2 weeks; her SOB [place] mostly when she moves around; recommendations made per nurse triage protocol; she verbalized understanding, and would like to see Delsa Grana because she would also like to discuss weight loss medication; there is no availability in the office per [facility]; pt directed to ED; she verbalized understanding; will route office for notification.    Reason for Disposition . [1] Difficulty breathing with exertion (e.g., walking) AND [2] new onset or worsening  Answer Assessment - Initial Assessment Questions 1. ONSET: "When did the swelling start?" (e.g., minutes, hours, days)     Around 07/05/19 2. LOCATION: "What part of the leg is swollen?"  "Are both legs swollen or just one leg?"    Calf to feet; R>L 3. SEVERITY: "How bad is the swelling?" (e.g., localized; mild, moderate, severe)  - Localized - small area of swelling localized to one leg  - MILD pedal edema - swelling limited to foot and ankle, pitting edema < 1/4 inch (6 mm) deep, rest and elevation eliminate most or all swelling  - MODERATE edema - swelling of lower leg to knee, pitting edema > 1/4 inch (6 mm) deep, rest and elevation only partially reduce swelling  - SEVERE edema - swelling extends above knee, facial or hand swelling present     moderate 4. REDNESS: "Does the swelling look red or infected?"   no 5. PAIN: "Is the swelling painful to touch?" If so, ask: "How painful is it?"   (Scale 1-10; mild, moderate or severe)    no 6. FEVER: "Do you have a fever?" If so, ask: "What is it, how was it measured, and when did it start?"      no 7. CAUSE: "What do you think is causing the leg swelling?"     Not sure 8. MEDICAL HISTORY: "Do you have a history of heart failure, kidney disease, liver failure, or  cancer?"    no 9. RECURRENT SYMPTOM: "Have you had leg swelling before?" If so, ask: "When was the last time?" "What happened that time?"   Years ago, when hctz was started 10. OTHER SYMPTOMS: "Do you have any other symptoms?" (e.g., chest pain, difficulty breathing)       Shortness of breath 11. PREGNANCY: "Is there any chance you are pregnant?" "When was your last menstrual period?"      No hysterectomy  Protocols used: LEG SWELLING AND EDEMA-A-AH

## 2019-08-05 NOTE — ED Provider Notes (Signed)
[facility] Emergency Department Provider Note  ____________________________________________   First MD Initiated Contact with Patient 08/05/19 2008     (approximate)  I have reviewed the triage vital signs and the nursing notes.   HISTORY  Chief Complaint Leg Swelling    HPI Wendy Kent is a 49 y.o. female with past medical history of hypertension here with leg edema.  The patient states that over the last month, she has had persistent bilateral leg swelling.  This first began after she ate a seafood boil, and she thought it was related to the food.  However, she has had persistent swelling since then.  She has had improvement with elevation overnight, but her symptoms quickly returned.  Her swelling is bilateral.  She describes pitting.  She denies any unilateral pain more than the other.  No recent immobilization.  She has even begun adjusting her diet as well as recently beginning to exercise without significant improvement.  Of note, she does note she was taken off hydrochlorothiazide and put on clonidine by her PCP.  She is unsure of why she was switched.  She notes that this did precede the onset of her symptoms.  Denies any cough or shortness of breath.  Denies any orthopnea.  No known history of heart failure.        Past Medical History:  Diagnosis Date  . Allergy   . Blood transfusion without reported diagnosis   . BP (high blood pressure) 12/29/2012  . Breast hypertrophy 11/10/2014  . GERD (gastroesophageal reflux disease)   . Thyroid disease    patient was cleared by specialist. History of Thyroid nodules.    Patient Active Problem List   Diagnosis Date Noted  . Lymphedema 01/21/2019  . Varicose veins of leg with pain, bilateral 10/14/2018  . Obesity (BMI 35.0-39.9 without comorbidity) 09/12/2017  . Hypokalemia 01/11/2016  . Hepatomegaly 12/22/2015  . Cardiomegaly 12/22/2015  . Lactose intolerance 09/26/2015  . IBS (irritable bowel  syndrome) 09/26/2015  . Multinodular goiter 09/28/2014  . Hyperthyroidism, subclinical 09/28/2014  . Allergic rhinitis, seasonal 09/28/2014  . Iron deficiency anemia 12/29/2012  . Hypertension goal BP (blood pressure) < 140/90 12/29/2012  . Class 3 severe obesity with body mass index (BMI) of 40.0 to 44.9 in adult ([place]) 12/29/2012  . Anemia 12/29/2012    Past Surgical History:  Procedure Laterality Date  . ABDOMINAL HYSTERECTOMY     patient still has ovaries  . BREAST REDUCTION SURGERY Bilateral 08/05/2017   Procedure: BREAST REDUCTION WITH LIPOSUCTION;  Surgeon: Cristine Polio, MD;  Location: [facility];  Service: Plastics;  Laterality: Bilateral;  . CHOLECYSTECTOMY    . COLONOSCOPY WITH PROPOFOL N/A 03/26/2018   Procedure: COLONOSCOPY WITH PROPOFOL;  Surgeon: Jonathon Bellows, MD;  Location: [facility] ENDOSCOPY;  Service: Gastroenterology;  Laterality: N/A;  . ESOPHAGOGASTRODUODENOSCOPY (EGD) WITH PROPOFOL N/A 03/26/2018   Procedure: ESOPHAGOGASTRODUODENOSCOPY (EGD) WITH PROPOFOL;  Surgeon: Jonathon Bellows, MD;  Location: [facility] ENDOSCOPY;  Service: Gastroenterology;  Laterality: N/A;  . GIVENS CAPSULE STUDY N/A 04/14/2018   Procedure: GIVENS CAPSULE STUDY;  Surgeon: Jonathon Bellows, MD;  Location: [facility] ENDOSCOPY;  Service: Gastroenterology;  Laterality: N/A;  . SHOULDER ARTHROSCOPY Right   . TUBAL LIGATION      Prior to Admission medications   Medication Sig Start Date End Date Taking? Authorizing Provider  cloNIDine (CATAPRES) 0.1 MG tablet Take 1 tablet (0.1 mg total) by mouth at bedtime. 07/29/19   Delsa Grana, PA-C  fluticasone (FLONASE) 50 MCG/ACT nasal spray  PLACE 2 SPRAYS INTO BOTH NOSTRILS DAILY AS NEEDED. 11/02/18   Steele Sizer, MD  hydrochlorothiazide (HYDRODIURIL) 25 MG tablet Take 1 tablet (25 mg total) by mouth daily. 08/05/19 09/04/19  Duffy Bruce, MD  meloxicam (MOBIC) 7.5 MG tablet Take 1 tablet (7.5 mg total) by mouth daily. 06/05/19   Delsa Grana, PA-C     Allergies Percocet [oxycodone-acetaminophen]  Family History  Problem Relation Age of Onset  . Hyperthyroidism Sister   . Alcohol abuse Brother     Social History Social History   Tobacco Use  . Smoking status: Never Smoker  . Smokeless tobacco: Never Used  Substance Use Topics  . Alcohol use: Yes    Alcohol/week: 0.0 standard drinks    Comment: socially  . Drug use: No    Review of Systems  Review of Systems  Constitutional: Negative for fatigue and fever.  HENT: Negative for congestion and sore throat.   Eyes: Negative for visual disturbance.  Respiratory: Negative for cough and shortness of breath.   Cardiovascular: Positive for leg swelling. Negative for chest pain.  Gastrointestinal: Negative for abdominal pain, diarrhea, nausea and vomiting.  Genitourinary: Negative for flank pain.  Musculoskeletal: Negative for back pain and neck pain.  Skin: Negative for rash and wound.  Neurological: Negative for weakness.     ____________________________________________  PHYSICAL EXAM:      VITAL SIGNS: ED Triage Vitals  Enc Vitals Group     BP 08/05/19 1835 (!) 189/109     Pulse Rate 08/05/19 1835 77     Resp 08/05/19 1835 20     Temp 08/05/19 1835 98.5 F (36.9 C)     Temp Source 08/05/19 1835 Oral     SpO2 08/05/19 1835 100 %     Weight 08/05/19 1836 225 lb (102.1 kg)     Height 08/05/19 1836 5\' 2"  (1.575 m)     Head Circumference --      Peak Flow --      Pain Score 08/05/19 1836 0     Pain Loc --      Pain Edu? --      Excl. in [place]? --      Physical Exam Vitals and nursing note reviewed.  Constitutional:      General: She is not in acute distress.    Appearance: She is well-developed.  HENT:     Head: Normocephalic and atraumatic.  Eyes:     Conjunctiva/sclera: Conjunctivae normal.  Cardiovascular:     Rate and Rhythm: Normal rate and regular rhythm.     Heart sounds: Normal heart sounds. No murmur. No friction rub.  Pulmonary:     Effort:  Pulmonary effort is normal. No respiratory distress.     Breath sounds: Normal breath sounds. No wheezing or rales.  Abdominal:     General: There is no distension.     Palpations: Abdomen is soft.     Tenderness: There is no abdominal tenderness.  Musculoskeletal:     Cervical back: Neck supple.     Right lower leg: Edema (2+ pitting) present.     Left lower leg: Edema (2+ pitting) present.  Skin:    General: Skin is warm.     Capillary Refill: Capillary refill takes less than 2 seconds.  Neurological:     Mental Status: She is alert and oriented to person, place, and time.     Motor: No abnormal muscle tone.       ____________________________________________   LABS (all labs  ordered are listed, but only abnormal results are displayed)  Labs Reviewed  CBC - Abnormal; Notable for the following components:      Result Value   RDW 16.5 (*)    All other components within normal limits  BASIC METABOLIC PANEL  BRAIN NATRIURETIC PEPTIDE  TROPONIN I (HIGH SENSITIVITY)  TROPONIN I (HIGH SENSITIVITY)    ____________________________________________  EKG: Normal sinus rhythm, ventricular rate 69.  PR 186, QRS 78, QTc 4 5.  No acute ST lesions or depressions. ________________________________________  RADIOLOGY All imaging, including plain films, CT scans, and ultrasounds, independently reviewed by me, and interpretations confirmed via formal radiology reads.  ED MD interpretation:   Chest x-ray: Clear, no cardiomegaly or CHF.  Official radiology report(s): DG Chest 2 View  Result Date: 08/05/2019 CLINICAL DATA:  Peripheral swelling for 1 month, shortness of breath EXAM: CHEST - 2 VIEW COMPARISON:  10/17/2014 FINDINGS: The heart size and mediastinal contours are within normal limits. Both lungs are clear. The visualized skeletal structures are unremarkable. IMPRESSION: No active cardiopulmonary disease. Electronically Signed   By: Inez Catalina M.D.   On: 08/05/2019 19:03     ____________________________________________  PROCEDURES   Procedure(s) performed (including Critical Care):  Procedures  ____________________________________________  INITIAL IMPRESSION / MDM / [place] / ED COURSE  As part of my medical decision making, I reviewed the following data within the [facility] notes reviewed and incorporated, Old chart reviewed, Notes from prior ED visits, and [place] Controlled Substance Database       *Roshawnda Bruesch was evaluated in Emergency Department on 08/05/2019 for the symptoms described in the history of present illness. She was evaluated in the context of the global COVID-19 pandemic, which necessitated consideration that the patient might be at risk for infection with the SARS-CoV-2 virus that causes COVID-19. Institutional protocols and algorithms that pertain to the evaluation of patients at risk for COVID-19 are in a state of rapid change based on information released by regulatory bodies including the CDC and federal and state organizations. These policies and algorithms were followed during the patient's care in the ED.  Some ED evaluations and interventions may be delayed as a result of limited staffing during the pandemic.*     Medical Decision Making: 49 year old female here with pitting edema bilateral lower extremities.  I suspect this is due to likely underlying hypertensive heart disease with dependent edema.  Fortunately, chest x-ray shows no evidence of cardiomegaly or CHF, and her BNP is normal.  Renal function is normal.  She has no known liver disease.  Her symptoms are bilateral with no redness and improved with elevation, and I do not suspect DVT.  It is unclear why she was switched off of her hydrochlorothiazide and as far as I can see, she had no evidence of issues with electrolytes or renal function with this.  She is on clonidine, which is not ideal for long-term management.  Will have her  start her hydrochlorothiazide back and follow-up with her PCP in the next week for repeat labs.  I have also given her dose of Lasix here.  Encouraged elevation of her legs and compression stockings.  ____________________________________________  FINAL CLINICAL IMPRESSION(S) / ED DIAGNOSES  Final diagnoses:  Peripheral edema     MEDICATIONS GIVEN DURING THIS VISIT:  Medications  furosemide (LASIX) tablet 40 mg (40 mg Oral Given 08/05/19 2107)  potassium chloride SA (KLOR-CON) CR tablet 40 mEq (40 mEq Oral Given 08/05/19 2107)  ED Discharge Orders         Ordered    hydrochlorothiazide (HYDRODIURIL) 25 MG tablet  Daily     08/05/19 2125           Note:  This document was prepared using Dragon voice recognition software and may include unintentional dictation errors.   Duffy Bruce, MD 08/05/19 2135

## 2019-08-05 NOTE — ED Triage Notes (Signed)
FIRST NURSE NOTE- ambulatory, NAD

## 2019-08-05 NOTE — ED Triage Notes (Signed)
Pt reports swelling in both lower legs for 1 month.  No chest pain  No sob.  No pain in legs .  Pt alert  Speech clear.

## 2019-08-06 NOTE — Telephone Encounter (Signed)
FYI sent to urgent care

## 2019-08-26 ENCOUNTER — Encounter: Payer: Self-pay | Admitting: Family Medicine

## 2019-08-26 ENCOUNTER — Other Ambulatory Visit: Payer: Self-pay

## 2019-08-26 ENCOUNTER — Ambulatory Visit: Payer: BC Managed Care – PPO | Admitting: Family Medicine

## 2019-08-26 DIAGNOSIS — R600 Localized edema: Secondary | ICD-10-CM

## 2019-08-26 DIAGNOSIS — I10 Essential (primary) hypertension: Secondary | ICD-10-CM

## 2019-08-26 DIAGNOSIS — Z79899 Other long term (current) drug therapy: Secondary | ICD-10-CM | POA: Diagnosis not present

## 2019-08-26 MED ORDER — HYDROCHLOROTHIAZIDE 25 MG PO TABS: 25.0000 mg | ORAL_TABLET | Freq: Every day | ORAL | 0 refills | Status: DC

## 2019-08-26 NOTE — Progress Notes (Signed)
Name: Wendy Kent   MRN: [id number]    DOB: Aug 11, 1970   Date:08/26/2019       Progress Note  Subjective  Chief Complaint  Chief Complaint  Patient presents with  . Hospitalization Follow-up  . peripheral edema    she has concerns about swelling. She said she has been evaluated for swelling, but it wasn't a concern. She went to ED and found out she had peripheral edema. She continues to have concerns about swelling.    HPI  Lower extremity edema: she was taking HCTZ but medication was switched to Clonidine to help with hot flashes back in March and she went to [facility] with 2 plus pitting edema April 14 th and labs were normal, she given lasix and sent home on HCTZ. She is doing well at this time. No sob or orthopnea  HTN: doing well on current regiment. HCTZ in am and clonidine at night to help with hot flashes. No chest pain or palpitation  Obesity: she has been going to boot camp, weight is down 11 lbs since 04/2019. She is eating healthy and exercising  Fatigue: she has been tired since she took the lasix and has been taking hctz, we will check labs.    Patient Active Problem List   Diagnosis Date Noted  . Lymphedema 01/21/2019  . Varicose veins of leg with pain, bilateral 10/14/2018  . Obesity (BMI 35.0-39.9 without comorbidity) 09/12/2017  . Hypokalemia 01/11/2016  . Hepatomegaly 12/22/2015  . Cardiomegaly 12/22/2015  . Lactose intolerance 09/26/2015  . IBS (irritable bowel syndrome) 09/26/2015  . Multinodular goiter 09/28/2014  . Hyperthyroidism, subclinical 09/28/2014  . Allergic rhinitis, seasonal 09/28/2014  . Iron deficiency anemia 12/29/2012  . Hypertension goal BP (blood pressure) < 140/90 12/29/2012  . Class 3 severe obesity with body mass index (BMI) of 40.0 to 44.9 in adult ([place]) 12/29/2012  . Anemia 12/29/2012    Past Surgical History:  Procedure Laterality Date  . ABDOMINAL HYSTERECTOMY     patient still has ovaries  . BREAST REDUCTION SURGERY Bilateral  08/05/2017   Procedure: BREAST REDUCTION WITH LIPOSUCTION;  Surgeon: Cristine Polio, MD;  Location: [place];  Service: Plastics;  Laterality: Bilateral;  . CHOLECYSTECTOMY    . COLONOSCOPY WITH PROPOFOL N/A 03/26/2018   Procedure: COLONOSCOPY WITH PROPOFOL;  Surgeon: Jonathon Bellows, MD;  Location: [facility] ENDOSCOPY;  Service: Gastroenterology;  Laterality: N/A;  . ESOPHAGOGASTRODUODENOSCOPY (EGD) WITH PROPOFOL N/A 03/26/2018   Procedure: ESOPHAGOGASTRODUODENOSCOPY (EGD) WITH PROPOFOL;  Surgeon: Jonathon Bellows, MD;  Location: [facility] ENDOSCOPY;  Service: Gastroenterology;  Laterality: N/A;  . GIVENS CAPSULE STUDY N/A 04/14/2018   Procedure: GIVENS CAPSULE STUDY;  Surgeon: Jonathon Bellows, MD;  Location: [facility] ENDOSCOPY;  Service: Gastroenterology;  Laterality: N/A;  . SHOULDER ARTHROSCOPY Right   . TUBAL LIGATION      Family History  Problem Relation Age of Onset  . Hyperthyroidism Sister   . Alcohol abuse Brother     Social History   Tobacco Use  . Smoking status: Never Smoker  . Smokeless tobacco: Never Used  Substance Use Topics  . Alcohol use: Yes    Alcohol/week: 0.0 standard drinks    Comment: socially     Current Outpatient Medications:  .  cloNIDine (CATAPRES) 0.1 MG tablet, Take 1 tablet (0.1 mg total) by mouth at bedtime., Disp: 90 tablet, Rfl: 1 .  fluticasone (FLONASE) 50 MCG/ACT nasal spray, PLACE 2 SPRAYS INTO BOTH NOSTRILS DAILY AS NEEDED., Disp: 48 mL, Rfl: 3 .  hydrochlorothiazide (HYDRODIURIL)  25 MG tablet, Take 1 tablet (25 mg total) by mouth daily., Disp: 90 tablet, Rfl: 0 .  meloxicam (MOBIC) 7.5 MG tablet, Take 1 tablet (7.5 mg total) by mouth daily., Disp: 30 tablet, Rfl: 0  Allergies  Allergen Reactions  . Percocet [Oxycodone-Acetaminophen] Anaphylaxis    I personally reviewed active problem list, medication list, allergies, family history, social history, health maintenance with the patient/caregiver today.   ROS  Constitutional: Negative for  fever or weight change.  Respiratory: Negative for cough and shortness of breath.   Cardiovascular: Negative for chest pain or palpitations.  Gastrointestinal: Negative for abdominal pain, no bowel changes.  Musculoskeletal: Negative for gait problem or joint swelling.  Skin: Negative for rash.  Neurological: Negative for dizziness or headache.  No other specific complaints in a complete review of systems (except as listed in HPI above).   Objective   Vitals:   08/26/19 1003  BP: 120/84  Pulse: (!) 107  Resp: 16  Temp: (!) 97.5 F (36.4 C)  TempSrc: Temporal  SpO2: 99%  Weight: 223 lb 14.4 oz (101.6 kg)  Height: 5\' 2"  (1.575 m)    Body mass index is 40.95 kg/m.  Physical Exam  Constitutional: Patient appears well-developed and well-nourished. Obese  No distress.  HEENT: head atraumatic, normocephalic, pupils equal and reactive to light Cardiovascular: Normal rate, regular rhythm and normal heart sounds.  No murmur heard. No BLE edema. Pulmonary/Chest: Effort normal and breath sounds normal. No respiratory distress. Abdominal: Soft.  There is no tenderness. Psychiatric: Patient has a normal mood and affect. behavior is normal. Judgment and thought content normal.  Recent Results (from the past 2160 hour(s))  Novel Coronavirus, NAA ([facility])     Status: None   Collection Time: 06/02/19  2:23 PM   Specimen: Oropharyngeal(OP) collection in vial transport medium   OROPHARYNGEA  TESTING  Result Value Ref Range   SARS-CoV-2, NAA Not Detected Not Detected    Comment: This nucleic acid amplification test was developed and its performance characteristics determined by [employer]. Nucleic acid amplification tests include RT-PCR and TMA. This test has not been FDA cleared or approved. This test has been authorized by FDA under an Emergency Use Authorization (EUA). This test is only authorized for the duration of time the declaration that circumstances exist justifying  the authorization of the emergency use of in vitro diagnostic tests for detection of SARS-CoV-2 virus and/or diagnosis of COVID-19 infection under section 564(b)(1) of the Act, 21 U.S.C. [id number]) (1), unless the authorization is terminated or revoked sooner. When diagnostic testing is negative, the possibility of a false negative result should be considered in the context of a patient's recent exposures and the presence of clinical signs and symptoms consistent with COVID-19. An individual without symptoms of COVID-19 and who is not shedding SARS-CoV-2 virus wo uld expect to have a negative (not detected) result in this assay.   Lipid panel     Status: None   Collection Time: 07/14/19  4:06 PM  Result Value Ref Range   Cholesterol 139 <200 mg/dL   HDL 56 > OR = 50 mg/dL   Triglycerides 85 <150 mg/dL   LDL Cholesterol (Calc) 66 mg/dL (calc)    Comment: Reference range: <100 . Desirable range <100 mg/dL for primary prevention;   <70 mg/dL for patients with CHD or diabetic patients  with > or = 2 CHD risk factors. Marland Kitchen LDL-C is now calculated using the Martin-Hopkins  calculation, which is a validated novel method providing  better accuracy than the Friedewald equation in the  estimation of LDL-C.  Cresenciano Genre et al. Annamaria Helling. [id number]): 2061-2068  (http://education.[employer].com/faq/FAQ164)    Total CHOL/HDL Ratio 2.5 <5.0 (calc)   Non-HDL Cholesterol (Calc) 83 <130 mg/dL (calc)    Comment: For patients with diabetes plus 1 major ASCVD risk  factor, treating to a non-HDL-C goal of <100 mg/dL  (LDL-C of <70 mg/dL) is considered a therapeutic  option.   Basic metabolic panel     Status: None   Collection Time: 08/05/19  6:38 PM  Result Value Ref Range   Sodium 137 135 - 145 mmol/L   Potassium 3.8 3.5 - 5.1 mmol/L   Chloride 106 98 - 111 mmol/L   CO2 25 22 - 32 mmol/L   Glucose, Bld 95 70 - 99 mg/dL    Comment: Glucose reference range applies only to samples taken after  fasting for at least 8 hours.   BUN 15 6 - 20 mg/dL   Creatinine, Ser 0.65 0.44 - 1.00 mg/dL   Calcium 9.0 8.9 - 10.3 mg/dL   GFR calc non Af Amer >60 >60 mL/min   GFR calc Af Amer >60 >60 mL/min   Anion gap 6 5 - 15    Comment: Performed at [facility], [address] [place], [place] [address]  CBC     Status: Abnormal   Collection Time: 08/05/19  6:38 PM  Result Value Ref Range   WBC 9.3 4.0 - 10.5 K/uL   RBC 4.24 3.87 - 5.11 MIL/uL   Hemoglobin 12.0 12.0 - 15.0 g/dL   HCT 36.7 36.0 - 46.0 %   MCV 86.6 80.0 - 100.0 fL   MCH 28.3 26.0 - 34.0 pg   MCHC 32.7 30.0 - 36.0 g/dL   RDW 16.5 (H) 11.5 - 15.5 %   Platelets 324 150 - 400 K/uL   nRBC 0.0 0.0 - 0.2 %    Comment: Performed at [facility], [place], [place] [address]  Troponin I (High Sensitivity)     Status: None   Collection Time: 08/05/19  6:38 PM  Result Value Ref Range   Troponin I (High Sensitivity) 4 <18 ng/L    Comment: (NOTE) Elevated high sensitivity troponin I (hsTnI) values and significant  changes across serial measurements may suggest ACS but many other  chronic and acute conditions are known to elevate hsTnI results.  Refer to the "Links" section for chest pain algorithms and additional  guidance. Performed at [facility], [address] [place], [place] [address]   Brain natriuretic peptide     Status: None   Collection Time: 08/05/19  6:38 PM  Result Value Ref Range   B Natriuretic Peptide 55.0 0.0 - 100.0 pg/mL    Comment: Performed at [facility], [address] [place], [place] [address]    PHQ2/9: Depression screen [facility] 2/9 08/26/2019 07/14/2019 06/05/2019 05/05/2019 01/14/2019  Decreased Interest 0 0 0 0 0  Down, Depressed, Hopeless 0 0 0 0 0  PHQ - 2 Score 0 0 0 0 0  Altered sleeping 0 0 0 0 0  Tired, decreased energy 0 0 0 0 0  Change in appetite 0 0 1 0 0  Feeling bad or failure about yourself  0 0 0 0 0  Trouble concentrating 0 0 0  0 0  Moving slowly or fidgety/restless 0 0 0 0 0  Suicidal thoughts 0 0 0 0 0  PHQ-9 Score 0  0 1 0 0  Difficult doing work/chores - Not difficult at all Not difficult at all Not difficult at all Not difficult at all  Some recent data might be hidden    phq 9 is negative   Fall Risk: Fall Risk  08/26/2019 07/14/2019 06/05/2019 05/05/2019 01/14/2019  Falls in the past year? 0 0 0 0 0  Comment - - - - -  Number falls in past yr: 0 0 0 0 0  Injury with Fall? 0 0 0 0 0     Functional Status Survey: Is the patient deaf or have difficulty hearing?: No Does the patient have difficulty seeing, even when wearing glasses/contacts?: Yes Does the patient have difficulty concentrating, remembering, or making decisions?: No Does the patient have difficulty walking or climbing stairs?: No Does the patient have difficulty dressing or bathing?: No Does the patient have difficulty doing errands alone such as visiting a doctor's office or shopping?: No    Assessment & Plan  1. Bilateral lower extremity edema  - hydrochlorothiazide (HYDRODIURIL) 25 MG tablet; Take 1 tablet (25 mg total) by mouth daily.  Dispense: 90 tablet; Refill: 0  2. Hypertension goal BP (blood pressure) < 140/90  - hydrochlorothiazide (HYDRODIURIL) 25 MG tablet; Take 1 tablet (25 mg total) by mouth daily.  Dispense: 90 tablet; Refill: 0 - BASIC METABOLIC PANEL WITH GFR - Magnesium  3. Morbid obesity ([place])  Discussed with the patient the risk posed by an increased BMI. Discussed importance of portion control, calorie counting and at least 150 minutes of physical activity weekly. Avoid sweet beverages and drink more water. Eat at least 6 servings of fruit and vegetables daily   BMI above 40  4. Long-term use of high-risk medication  - BASIC METABOLIC PANEL WITH GFR - Magnesium

## 2019-08-27 LAB — BASIC METABOLIC PANEL WITH GFR
BUN: 12 mg/dL (ref 7–25)
CO2: 30 mmol/L (ref 20–32)
Calcium: 10 mg/dL (ref 8.6–10.2)
Chloride: 103 mmol/L (ref 98–110)
Creat: 0.74 mg/dL (ref 0.50–1.10)
GFR, Est African American: 110 mL/min/{1.73_m2} (ref >=60)
GFR, Est Non African American: 95 mL/min/{1.73_m2} (ref >=60)
Glucose, Bld: 85 mg/dL (ref 65–99)
Potassium: 5.1 mmol/L (ref 3.5–5.3)
Sodium: 143 mmol/L (ref 135–146)

## 2019-08-27 LAB — MAGNESIUM: Magnesium: 1.9 mg/dL (ref 1.5–2.5)

## 2019-10-30 DIAGNOSIS — H524 Presbyopia: Secondary | ICD-10-CM | POA: Diagnosis not present

## 2019-11-20 ENCOUNTER — Other Ambulatory Visit: Payer: Self-pay | Admitting: Family Medicine

## 2019-11-20 DIAGNOSIS — I10 Essential (primary) hypertension: Secondary | ICD-10-CM

## 2019-11-20 DIAGNOSIS — R600 Localized edema: Secondary | ICD-10-CM

## 2019-11-20 MED ORDER — HYDROCHLOROTHIAZIDE 25 MG PO TABS: 25.0000 mg | ORAL_TABLET | Freq: Every day | ORAL | 1 refills | Status: DC

## 2019-11-20 NOTE — Telephone Encounter (Signed)
Patient requesting hydrochlorothiazide (HYDRODIURIL) 25 MG tablet, informed patient please allow 48 to 72 hour turn around time  [id number] [place], [place] - [address] Phone:  [phone]  Fax:  [phone]

## 2019-11-24 ENCOUNTER — Other Ambulatory Visit: Payer: Self-pay | Admitting: Family Medicine

## 2020-01-14 ENCOUNTER — Ambulatory Visit: Payer: BC Managed Care – PPO | Admitting: Family Medicine

## 2020-01-28 ENCOUNTER — Other Ambulatory Visit: Payer: Self-pay | Admitting: Family Medicine

## 2020-01-28 DIAGNOSIS — R232 Flushing: Secondary | ICD-10-CM

## 2020-02-26 ENCOUNTER — Ambulatory Visit (INDEPENDENT_AMBULATORY_CARE_PROVIDER_SITE_OTHER): Payer: BC Managed Care – PPO | Admitting: Family Medicine

## 2020-02-26 ENCOUNTER — Other Ambulatory Visit: Payer: Self-pay

## 2020-02-26 ENCOUNTER — Encounter: Payer: Self-pay | Admitting: Family Medicine

## 2020-02-26 VITALS — BP 130/76 | HR 61 | Temp 97.7°F | Resp 15 | Ht 62.0 in | Wt 220.7 lb

## 2020-02-26 DIAGNOSIS — E042 Nontoxic multinodular goiter: Secondary | ICD-10-CM | POA: Diagnosis not present

## 2020-02-26 DIAGNOSIS — K760 Fatty (change of) liver, not elsewhere classified: Secondary | ICD-10-CM | POA: Diagnosis not present

## 2020-02-26 DIAGNOSIS — I10 Essential (primary) hypertension: Secondary | ICD-10-CM

## 2020-02-26 DIAGNOSIS — R600 Localized edema: Secondary | ICD-10-CM | POA: Diagnosis not present

## 2020-02-26 DIAGNOSIS — E0590 Thyrotoxicosis, unspecified without thyrotoxic crisis or storm: Secondary | ICD-10-CM

## 2020-02-26 MED ORDER — HYDROCHLOROTHIAZIDE 25 MG PO TABS: 25.0000 mg | ORAL_TABLET | Freq: Every day | ORAL | 1 refills | Status: DC

## 2020-02-26 NOTE — Progress Notes (Signed)
Name: Wendy Kent   MRN: [id number]    DOB: Oct 10, 1970   Date:02/26/2020       Progress Note  Subjective  Chief Complaint  Follow up   HPI   Lower extremity edema: she was taking HCTZ but medication was switched to Clonidine to help with hot flashes back in March and she went to [facility] with 2 plus pitting edema April 14 th and labs were normal, she given lasix and sent home on HCTZ. She is doing well at this time. No sob or orthopnea, compliant with medication and not side effects.   HTN: doing well on current regiment. HCTZ in am and clonidine at night to help with hot flashes. No chest pain or palpitation  Obesity: she has been going to boot camp, weight is down 15 lbs since 04/2019. She is eating healthy, exercising 3 days a week for one hour and tries to be more active in general, walking more . Also being more mindful with portion size . She took phentermine and also [place] but off adipex for over one year, and Saxenda since Summer 2021 and is doing well with weight loss   Subclinical hyperparathyroidism: last TSH was at goal. Denies diarrhea and states palpitation has resolved   Fatty liver: had a CT that showed large liver, but [place] normal in size, just fatty liver, she is losing weight and liver enzymes normal.   Patient Active Problem List   Diagnosis Date Noted  . Fatty liver 02/26/2020  . Lymphedema 01/21/2019  . Varicose veins of leg with pain, bilateral 10/14/2018  . Obesity (BMI 35.0-39.9 without comorbidity) 09/12/2017  . Lactose intolerance 09/26/2015  . IBS (irritable bowel syndrome) 09/26/2015  . Multinodular goiter 09/28/2014  . Hyperthyroidism, subclinical 09/28/2014  . Allergic rhinitis, seasonal 09/28/2014  . Hypertension goal BP (blood pressure) < 140/90 12/29/2012  . Anemia 12/29/2012    Past Surgical History:  Procedure Laterality Date  . ABDOMINAL HYSTERECTOMY     patient still has ovaries  . BREAST REDUCTION SURGERY Bilateral 08/05/2017   Procedure:  BREAST REDUCTION WITH LIPOSUCTION;  Surgeon: Cristine Polio, MD;  Location: [place];  Service: Plastics;  Laterality: Bilateral;  . CHOLECYSTECTOMY    . COLONOSCOPY WITH PROPOFOL N/A 03/26/2018   Procedure: COLONOSCOPY WITH PROPOFOL;  Surgeon: Jonathon Bellows, MD;  Location: [facility] ENDOSCOPY;  Service: Gastroenterology;  Laterality: N/A;  . ESOPHAGOGASTRODUODENOSCOPY (EGD) WITH PROPOFOL N/A 03/26/2018   Procedure: ESOPHAGOGASTRODUODENOSCOPY (EGD) WITH PROPOFOL;  Surgeon: Jonathon Bellows, MD;  Location: [facility] ENDOSCOPY;  Service: Gastroenterology;  Laterality: N/A;  . GIVENS CAPSULE STUDY N/A 04/14/2018   Procedure: GIVENS CAPSULE STUDY;  Surgeon: Jonathon Bellows, MD;  Location: [facility] ENDOSCOPY;  Service: Gastroenterology;  Laterality: N/A;  . SHOULDER ARTHROSCOPY Right   . TUBAL LIGATION      Family History  Problem Relation Age of Onset  . Hyperthyroidism Sister   . Alcohol abuse Brother     Social History   Tobacco Use  . Smoking status: Never Smoker  . Smokeless tobacco: Never Used  Substance Use Topics  . Alcohol use: Yes    Alcohol/week: 0.0 standard drinks    Comment: socially     Current Outpatient Medications:  .  cloNIDine (CATAPRES) 0.1 MG tablet, TAKE 1 TABLET (0.1 MG TOTAL) BY MOUTH AT BEDTIME., Disp: 90 tablet, Rfl: 1 .  fluticasone (FLONASE) 50 MCG/ACT nasal spray, PLACE 2 SPRAYS INTO BOTH NOSTRILS DAILY AS NEEDED., Disp: 48 mL, Rfl: 3 .  meloxicam (MOBIC) 7.5 MG tablet,  Take 1 tablet (7.5 mg total) by mouth daily., Disp: 30 tablet, Rfl: 0 .  hydrochlorothiazide (HYDRODIURIL) 25 MG tablet, Take 1 tablet (25 mg total) by mouth daily., Disp: 90 tablet, Rfl: 1  Allergies  Allergen Reactions  . Percocet [Oxycodone-Acetaminophen] Anaphylaxis    I personally reviewed active problem list, medication list, allergies, family history, social history, health maintenance with the patient/caregiver today.   ROS  Ten systems reviewed and is negative except as  mentioned in HPI   Objective  Vitals:   02/26/20 1512  BP: 130/76  Pulse: 61  Resp: 15  Temp: 97.7 F (36.5 C)  TempSrc: Oral  SpO2: 99%  Weight: 220 lb 11.2 oz (100.1 kg)  Height: 5\' 2"  (1.575 m)    Body mass index is 40.37 kg/m.  Physical Exam  Constitutional: Patient appears well-developed and well-nourished. Obese No distress.  HEENT: head atraumatic, normocephalic, pupils equal and reactive to light,  neck supple, throat within normal limits Cardiovascular: Normal rate, regular rhythm and normal heart sounds.  No murmur heard. No BLE edema. Pulmonary/Chest: Effort normal and breath sounds normal. No respiratory distress. Abdominal: Soft.  There is no tenderness. Psychiatric: Patient has a normal mood and affect. behavior is normal. Judgment and thought content normal.  PHQ2/9: Depression screen [facility] 2/9 02/26/2020 08/26/2019 07/14/2019 06/05/2019 05/05/2019  Decreased Interest 0 0 0 0 0  Down, Depressed, Hopeless 0 0 0 0 0  PHQ - 2 Score 0 0 0 0 0  Altered sleeping - 0 0 0 0  Tired, decreased energy - 0 0 0 0  Change in appetite - 0 0 1 0  Feeling bad or failure about yourself  - 0 0 0 0  Trouble concentrating - 0 0 0 0  Moving slowly or fidgety/restless - 0 0 0 0  Suicidal thoughts - 0 0 0 0  PHQ-9 Score - 0 0 1 0  Difficult doing work/chores - - Not difficult at all Not difficult at all Not difficult at all  Some recent data might be hidden    phq 9 is negative   Fall Risk: Fall Risk  02/26/2020 08/26/2019 07/14/2019 06/05/2019 05/05/2019  Falls in the past year? 0 0 0 0 0  Comment - - - - -  Number falls in past yr: 0 0 0 0 0  Injury with Fall? 0 0 0 0 0     Functional Status Survey: Is the patient deaf or have difficulty hearing?: No Does the patient have difficulty seeing, even when wearing glasses/contacts?: No Does the patient have difficulty concentrating, remembering, or making decisions?: No Does the patient have difficulty walking or climbing stairs?:  No Does the patient have difficulty dressing or bathing?: No Does the patient have difficulty doing errands alone such as visiting a doctor's office or shopping?: No    Assessment & Plan  1. Fatty liver   2. Bilateral lower extremity edema  - hydrochlorothiazide (HYDRODIURIL) 25 MG tablet; Take 1 tablet (25 mg total) by mouth daily.  Dispense: 90 tablet; Refill: 1  3. Hypertension goal BP (blood pressure) < 140/90  - hydrochlorothiazide (HYDRODIURIL) 25 MG tablet; Take 1 tablet (25 mg total) by mouth daily.  Dispense: 90 tablet; Refill: 1  4. Multinodular goiter   5. Hyperthyroidism, subclinical  Recheck labs

## 2020-03-28 ENCOUNTER — Ambulatory Visit: Payer: Self-pay

## 2020-03-28 NOTE — Telephone Encounter (Addendum)
Pt. Reports Friday she was wearing her earbuds and right ear started feeling swollen inside. Mild pain. Has noticed decreased hearing in that ear. Denies and cold symptoms or congestion. OK per Cassandra in the practice for in-office visit. Appointment made. Reason for Disposition . Earache persists > 1 hour  Answer Assessment - Initial Assessment Questions 1. LOCATION: "Which ear is involved?"       Right 2. SENSATION: "Describe how the ear feels." (e.g. stuffy, full, plugged)."      Swollen 3. ONSET:  "When did the ear symptoms start?"       Last Friday 4. PAIN: "Do you also have an earache?" If Yes, ask: "How bad is it?" (Scale 1-10; or mild, moderate, severe)     Mild 5. CAUSE: "What do you think is causing the ear congestion?"     Unsure 6. URI: "Do you have a runny nose or cough?"      No 7. NASAL ALLERGIES: "Are there symptoms of hay fever, such as sneezing or a clear nasal discharge?"     Sneezing 8. PREGNANCY: "Is there any chance you are pregnant?" "When was your last menstrual period?"     No  Protocols used: EAR - CONGESTION-A-AH

## 2020-03-29 ENCOUNTER — Ambulatory Visit: Payer: BC Managed Care – PPO | Admitting: Family Medicine

## 2020-03-29 ENCOUNTER — Encounter: Payer: Self-pay | Admitting: Family Medicine

## 2020-03-29 ENCOUNTER — Other Ambulatory Visit: Payer: Self-pay

## 2020-03-29 VITALS — BP 120/72 | HR 82 | Temp 97.9°F | Resp 18 | Ht 62.0 in | Wt 216.8 lb

## 2020-03-29 DIAGNOSIS — H9201 Otalgia, right ear: Secondary | ICD-10-CM

## 2020-03-29 DIAGNOSIS — H60501 Unspecified acute noninfective otitis externa, right ear: Secondary | ICD-10-CM

## 2020-03-29 DIAGNOSIS — H6981 Other specified disorders of Eustachian tube, right ear: Secondary | ICD-10-CM

## 2020-03-29 DIAGNOSIS — J302 Other seasonal allergic rhinitis: Secondary | ICD-10-CM

## 2020-03-29 DIAGNOSIS — J310 Chronic rhinitis: Secondary | ICD-10-CM

## 2020-03-29 DIAGNOSIS — J329 Chronic sinusitis, unspecified: Secondary | ICD-10-CM

## 2020-03-29 DIAGNOSIS — H9191 Unspecified hearing loss, right ear: Secondary | ICD-10-CM

## 2020-03-29 DIAGNOSIS — H6991 Unspecified Eustachian tube disorder, right ear: Secondary | ICD-10-CM

## 2020-03-29 MED ORDER — FLUTICASONE PROPIONATE 50 MCG/ACT NA SUSP: 2.0000 | Freq: Every day | NASAL | 3 refills | Status: DC | PRN

## 2020-03-29 MED ORDER — MUPIROCIN 2 % EX OINT: 1.0000 "application " | TOPICAL_OINTMENT | Freq: Two times a day (BID) | CUTANEOUS | 0 refills | Status: DC

## 2020-03-29 MED ORDER — NEOMYCIN-POLYMYXIN-HC 3.5-10000-1 OT SOLN: 4.0000 [drp] | Freq: Four times a day (QID) | OTIC | 0 refills | Status: AC

## 2020-03-29 NOTE — Progress Notes (Signed)
Patient ID: Wendy Kent, female    DOB: [birth year], 49 y.o.   MRN: [id number]  PCP: Steele Sizer, MD  Chief Complaint  Patient presents with  . Otalgia    fullness, knot in ear, decrease hearing for 5 days    Subjective:   Wendy Kent is a 49 y.o. female, presents to clinic with CC of the following:  HPI  Patient presents for right ear pain in her ear canal and also sensation of decreased hearing and feeling like her right ear is blocked with associated popping She put in a earbud few days ago and afterwards had some swelling pain and a tender bump on the inside of her ear canal.  She has no pain to her outer ear. She has history of allergic rhinitis but is not felt that she has had any nasal symptoms or postnasal drip In the past she used Flonase and it was effective, she is not currently on any antihistamines She denies any fever chills sweats, sinus pain or pressure, sore throat, headaches, lymphadenopathy  Patient Active Problem List   Diagnosis Date Noted  . Fatty liver 02/26/2020  . Lymphedema 01/21/2019  . Varicose veins of leg with pain, bilateral 10/14/2018  . Obesity (BMI 35.0-39.9 without comorbidity) 09/12/2017  . Lactose intolerance 09/26/2015  . IBS (irritable bowel syndrome) 09/26/2015  . Multinodular goiter 09/28/2014  . Hyperthyroidism, subclinical 09/28/2014  . Allergic rhinitis, seasonal 09/28/2014  . Hypertension goal BP (blood pressure) < 140/90 12/29/2012  . Anemia 12/29/2012      Current Outpatient Medications:  .  cloNIDine (CATAPRES) 0.1 MG tablet, TAKE 1 TABLET (0.1 MG TOTAL) BY MOUTH AT BEDTIME., Disp: 90 tablet, Rfl: 1 .  fluticasone (FLONASE) 50 MCG/ACT nasal spray, Place 2 sprays into both nostrils daily as needed., Disp: 48 mL, Rfl: 3 .  hydrochlorothiazide (HYDRODIURIL) 25 MG tablet, Take 1 tablet (25 mg total) by mouth daily., Disp: 90 tablet, Rfl: 1 .  meloxicam (MOBIC) 7.5 MG tablet, Take 1 tablet (7.5 mg total) by mouth  daily. (Patient not taking: Reported on 03/29/2020), Disp: 30 tablet, Rfl: 0 .  mupirocin ointment (BACTROBAN) 2 %, Apply 1 application topically 2 (two) times daily., Disp: 22 g, Rfl: 0 .  neomycin-polymyxin-hydrocortisone (CORTISPORIN) OTIC solution, Place 4 drops into both ears 4 (four) times daily for 7 days., Disp: 10 mL, Rfl: 0   Allergies  Allergen Reactions  . Percocet [Oxycodone-Acetaminophen] Anaphylaxis     Social History   Tobacco Use  . Smoking status: Never Smoker  . Smokeless tobacco: Never Used  Vaping Use  . Vaping Use: Never used  Substance Use Topics  . Alcohol use: Yes    Alcohol/week: 0.0 standard drinks    Comment: socially  . Drug use: No      Chart Review Today: I personally reviewed active problem list, medication list, allergies, family history, social history, health maintenance, notes from last encounter, lab results, imaging with the patient/caregiver today.   Review of Systems 10 Systems reviewed and are negative for acute change except as noted in the HPI.     Objective:   Vitals:   03/29/20 1412  BP: 120/72  Pulse: 82  Resp: 18  Temp: 97.9 F (36.6 C)  TempSrc: Oral  SpO2: 98%  Weight: 216 lb 12.8 oz (98.3 kg)  Height: 5\' 2"  (1.575 m)    Body mass index is 39.65 kg/m.  Physical Exam Vitals and nursing note reviewed.  Constitutional:      General:  She is not in acute distress.    Appearance: Normal appearance. She is well-developed. She is obese. She is not ill-appearing, toxic-appearing or diaphoretic.  HENT:     Head: Normocephalic and atraumatic.     Right Ear: Tympanic membrane normal. Decreased hearing noted. Swelling and tenderness present. No drainage. No middle ear effusion. There is no impacted cerumen. No mastoid tenderness.     Left Ear: Tympanic membrane normal. No decreased hearing noted. No swelling or tenderness.  No middle ear effusion. No mastoid tenderness.     Ears:     Comments: Right external auditory  canal inferior distal aspect has a erythematous tender papule without any discharge or purulence Right ear has no tragus tenderness to palpation or tenderness with manipulation of pinna    Nose: Mucosal edema and congestion present.     Right Turbinates: Enlarged and swollen.     Left Turbinates: Enlarged and swollen.     Right Sinus: No maxillary sinus tenderness or frontal sinus tenderness.     Left Sinus: No maxillary sinus tenderness or frontal sinus tenderness.     Mouth/Throat:     Mouth: Mucous membranes are moist.     Pharynx: Uvula midline. Pharyngeal swelling and posterior oropharyngeal erythema present. No oropharyngeal exudate or uvula swelling.     Tonsils: No tonsillar exudate or tonsillar abscesses.  Eyes:     General:        Right eye: No discharge.        Left eye: No discharge.     Conjunctiva/sclera: Conjunctivae normal.  Neck:     Trachea: No tracheal deviation.  Cardiovascular:     Rate and Rhythm: Normal rate and regular rhythm.  Pulmonary:     Effort: Pulmonary effort is normal. No respiratory distress.     Breath sounds: No stridor.  Musculoskeletal:        General: Normal range of motion.     Cervical back: Normal range of motion and neck supple.  Lymphadenopathy:     Cervical: No cervical adenopathy.  Skin:    General: Skin is warm and dry.     Findings: No rash.  Neurological:     Mental Status: She is alert.     Motor: No abnormal muscle tone.     Coordination: Coordination normal.  Psychiatric:        Behavior: Behavior normal.      Results for orders placed or performed in visit on 97/98/92  BASIC METABOLIC PANEL WITH GFR  Result Value Ref Range   Glucose, Bld 85 65 - 99 mg/dL   BUN 12 7 - 25 mg/dL   Creat 0.74 0.50 - 1.10 mg/dL   GFR, Est Non African American 95 > OR = 60 mL/min/1.28m2   GFR, Est African American 110 > OR = 60 mL/min/1.62m2   BUN/Creatinine Ratio NOT APPLICABLE 6 - 22 (calc)   Sodium 143 135 - 146 mmol/L   Potassium 5.1  3.5 - 5.3 mmol/L   Chloride 103 98 - 110 mmol/L   CO2 30 20 - 32 mmol/L   Calcium 10.0 8.6 - 10.2 mg/dL  Magnesium  Result Value Ref Range   Magnesium 1.9 1.5 - 2.5 mg/dL       Assessment & Plan:     ICD-10-CM   1. Acute otalgia, right  H92.01    Area of swelling, papule suspected infection, difficult examination given location, no drainage - will tx with topical abx first, may need oral abx if not  impro  2. Decreased hearing of right ear  H91.91 fluticasone (FLONASE) 50 MCG/ACT nasal spray   suspect secondary to rhinosinusitis and eustachian tube dysfunction- see below  3. Dysfunction of right eustachian tube  H69.81 fluticasone (FLONASE) 50 MCG/ACT nasal spray   Normal TM translucent, pearly grey, visible landmarks  4. Rhinosinusitis  J31.0 fluticasone (FLONASE) 50 MCG/ACT nasal spray   J32.9    Very edematous and irritated appearing nasal mucosa and posterior pharynx, encourage patient to increase allergic rhinitis management and can try decongestants  5. Acute otitis externa of right ear, unspecified type  H60.501 neomycin-polymyxin-hydrocortisone (CORTISPORIN) OTIC solution    mupirocin ointment (BACTROBAN) 2 %   see #1 -encouraged her to try drops and saturating a cottonball and laying over area of pain and swelling  6. Seasonal allergic rhinitis, unspecified trigger  J30.2    Med refill    Pt can try mupirocin to affected area if failed cortisporin drops  Encouraged her to not use any headphones or earplugs for a while while treating this  Because it appears somewhat of a papule I am not sure if it could be abscess or cyst?  We discussed having her try doing some warm compresses with soaking cotton balls and warm water and hydrogen peroxide to see that would encourage any drainage?  Do want her to follow-up very closely if she has any worsening  Encourage patient to start an over-the-counter antihistamine with her Flonase and optional decongestants try and help manage her  decreased hearing and popping and clicking her ear which is likely eustachian tube dysfunction    Delsa Grana, PA-C 03/29/20 5:53 PM

## 2020-03-29 NOTE — Patient Instructions (Signed)
Eustachian Tube Dysfunction ° °Eustachian tube dysfunction refers to a condition in which a blockage develops in the narrow passage that connects the middle ear to the back of the nose (eustachian tube). The eustachian tube regulates air pressure in the middle ear by letting air move between the ear and nose. It also helps to drain fluid from the middle ear space. °Eustachian tube dysfunction can affect one or both ears. When the eustachian tube does not function properly, air pressure, fluid, or both can build up in the middle ear. °What are the causes? °This condition occurs when the eustachian tube becomes blocked or cannot open normally. Common causes of this condition include: °· Ear infections. °· Colds and other infections that affect the nose, mouth, and throat (upper respiratory tract). °· Allergies. °· Irritation from cigarette smoke. °· Irritation from stomach acid coming up into the esophagus (gastroesophageal reflux). The esophagus is the tube that carries food from the mouth to the stomach. °· Sudden changes in air pressure, such as from descending in an airplane or scuba diving. °· Abnormal growths in the nose or throat, such as: °? Growths that line the nose (nasal polyps). °? Abnormal growth of cells (tumors). °? Enlarged tissue at the back of the throat (adenoids). °What increases the risk? °You are more likely to develop this condition if: °· You smoke. °· You are overweight. °· You are a child who has: °? Certain birth defects of the mouth, such as cleft palate. °? Large tonsils or adenoids. °What are the signs or symptoms? °Common symptoms of this condition include: °· A feeling of fullness in the ear. °· Ear pain. °· Clicking or popping noises in the ear. °· Ringing in the ear. °· Hearing loss. °· Loss of balance. °· Dizziness. °Symptoms may get worse when the air pressure around you changes, such as when you travel to an area of high elevation, fly on an airplane, or go scuba diving. °How is  this diagnosed? °This condition may be diagnosed based on: °· Your symptoms. °· A physical exam of your ears, nose, and throat. °· Tests, such as those that measure: °? The movement of your eardrum (tympanogram). °? Your hearing (audiometry). °How is this treated? °Treatment depends on the cause and severity of your condition. °· In mild cases, you may relieve your symptoms by moving air into your ears. This is called "popping the ears." °· In more severe cases, or if you have symptoms of fluid in your ears, treatment may include: °? Medicines to relieve congestion (decongestants). °? Medicines that treat allergies (antihistamines). °? Nasal sprays or ear drops that contain medicines that reduce swelling (steroids). °? A procedure to drain the fluid in your eardrum (myringotomy). In this procedure, a small tube is placed in the eardrum to: °§ Drain the fluid. °§ Restore the air in the middle ear space. °? A procedure to insert a balloon device through the nose to inflate the opening of the eustachian tube (balloon dilation). °Follow these instructions at home: °Lifestyle °· Do not do any of the following until your health care provider approves: °? Travel to high altitudes. °? Fly in airplanes. °? Work in a pressurized cabin or room. °? Scuba dive. °· Do not use any products that contain nicotine or tobacco, such as cigarettes and e-cigarettes. If you need help quitting, ask your health care provider. °· Keep your ears dry. Wear fitted earplugs during showering and bathing. Dry your ears completely after. °General instructions °· Take over-the-counter   and prescription medicines only as told by your health care provider. °· Use techniques to help pop your ears as recommended by your health care provider. These may include: °? Chewing gum. °? Yawning. °? Frequent, forceful swallowing. °? Closing your mouth, holding your nose closed, and gently blowing as if you are trying to blow air out of your nose. °· Keep all  follow-up visits as told by your health care provider. This is important. °Contact a health care provider if: °· Your symptoms do not go away after treatment. °· Your symptoms come back after treatment. °· You are unable to pop your ears. °· You have: °? A fever. °? Pain in your ear. °? Pain in your head or neck. °? Fluid draining from your ear. °· Your hearing suddenly changes. °· You become very dizzy. °· You lose your balance. °Summary °· Eustachian tube dysfunction refers to a condition in which a blockage develops in the eustachian tube. °· It can be caused by ear infections, allergies, inhaled irritants, or abnormal growths in the nose or throat. °· Symptoms include ear pain, hearing loss, or ringing in the ears. °· Mild cases are treated with maneuvers to unblock the ears, such as yawning or ear popping. °· Severe cases are treated with medicines. Surgery may also be done (rare). °This information is not intended to replace advice given to you by your health care provider. Make sure you discuss any questions you have with your health care provider. °Document Revised: 07/30/2017 Document Reviewed: 07/30/2017 °Elsevier Patient Education © 2020 [employer] ° °

## 2020-04-01 DIAGNOSIS — Z20822 Contact with and (suspected) exposure to covid-19: Secondary | ICD-10-CM | POA: Diagnosis not present

## 2020-04-21 ENCOUNTER — Encounter: Payer: Self-pay | Admitting: Emergency Medicine

## 2020-04-21 ENCOUNTER — Emergency Department
Admission: EM | Admit: 2020-04-21 | Discharge: 2020-04-21 | Disposition: A | Discharge status: Home / Self Care | Payer: BC Managed Care – PPO | Attending: Emergency Medicine | Admitting: Emergency Medicine

## 2020-04-21 ENCOUNTER — Emergency Department: Payer: BC Managed Care – PPO

## 2020-04-21 ENCOUNTER — Other Ambulatory Visit: Payer: Self-pay

## 2020-04-21 DIAGNOSIS — I10 Essential (primary) hypertension: Secondary | ICD-10-CM | POA: Insufficient documentation

## 2020-04-21 DIAGNOSIS — Z79899 Other long term (current) drug therapy: Secondary | ICD-10-CM | POA: Insufficient documentation

## 2020-04-21 DIAGNOSIS — R070 Pain in throat: Secondary | ICD-10-CM | POA: Diagnosis present

## 2020-04-21 DIAGNOSIS — U071 COVID-19: Secondary | ICD-10-CM | POA: Diagnosis not present

## 2020-04-21 DIAGNOSIS — I517 Cardiomegaly: Secondary | ICD-10-CM | POA: Diagnosis not present

## 2020-04-21 DIAGNOSIS — R059 Cough, unspecified: Secondary | ICD-10-CM | POA: Diagnosis not present

## 2020-04-21 DIAGNOSIS — R509 Fever, unspecified: Secondary | ICD-10-CM | POA: Diagnosis not present

## 2020-04-21 LAB — POC SARS CORONAVIRUS 2 AG -  ED: SARS Coronavirus 2 Ag: NEGATIVE

## 2020-04-21 MED ORDER — BENZONATATE 100 MG PO CAPS: 100.0000 mg | ORAL_CAPSULE | Freq: Four times a day (QID) | ORAL | 0 refills | Status: DC | PRN

## 2020-04-21 MED ORDER — PREDNISONE 20 MG PO TABS
60.0000 mg | ORAL_TABLET | Freq: Once | ORAL | Status: AC
  Administered 2020-04-21: 60 mg via ORAL
  Filled 2020-04-21: qty 3

## 2020-04-21 MED ORDER — ONDANSETRON 4 MG PO TBDP: 4.0000 mg | ORAL_TABLET | Freq: Three times a day (TID) | ORAL | 0 refills | Status: DC | PRN

## 2020-04-21 MED ORDER — ALBUTEROL SULFATE HFA 108 (90 BASE) MCG/ACT IN AERS: 2.0000 | INHALATION_SPRAY | RESPIRATORY_TRACT | 0 refills | Status: DC | PRN

## 2020-04-21 MED ORDER — PREDNISONE 50 MG PO TABS: 50.0000 mg | ORAL_TABLET | Freq: Every day | ORAL | 0 refills | Status: DC

## 2020-04-21 MED ORDER — PSEUDOEPH-BROMPHEN-DM 30-2-10 MG/5ML PO SYRP: 10.0000 mL | ORAL_SOLUTION | Freq: Four times a day (QID) | ORAL | 0 refills | Status: DC | PRN

## 2020-04-21 NOTE — ED Notes (Signed)
Due to signature pad not working, pt gave verbal consent for DC.

## 2020-04-21 NOTE — ED Provider Notes (Signed)
[facility] Emergency Department Provider Note  ____________________________________________  Time seen: Approximately 8:10 PM  I have reviewed the triage vital signs and the nursing notes.   HISTORY  Chief Complaint Cough and Generalized Body Aches    HPI Wendy Kent is a 49 y.o. female who presents the emergency department complaining of headache, nasal congestion, sore throat, cough x2 days.  Patient states that yesterday her symptoms began with a cough and rapidly progressed to include the rest of her symptoms.  She is also experiencing body aches.  She states that her son had Covid earlier this month but that was almost a full calendar month ago.  Patient does not know of any other specific Covid contact.  She has not received influenza or Covid vaccine this year.  No frank difficulty breathing or chest pain.  No GI complaints.  Patient has been using allergy medication, cough and cold medication without relief of symptoms.  Medical history as described below with no complaints of chronic medical issues.         Past Medical History:  Diagnosis Date  . Allergy   . Blood transfusion without reported diagnosis   . BP (high blood pressure) 12/29/2012  . Breast hypertrophy 11/10/2014  . GERD (gastroesophageal reflux disease)   . Thyroid disease    patient was cleared by specialist. History of Thyroid nodules.    Patient Active Problem List   Diagnosis Date Noted  . Fatty liver 02/26/2020  . Lymphedema 01/21/2019  . Varicose veins of leg with pain, bilateral 10/14/2018  . Obesity (BMI 35.0-39.9 without comorbidity) 09/12/2017  . Lactose intolerance 09/26/2015  . IBS (irritable bowel syndrome) 09/26/2015  . Multinodular goiter 09/28/2014  . Hyperthyroidism, subclinical 09/28/2014  . Allergic rhinitis, seasonal 09/28/2014  . Hypertension goal BP (blood pressure) < 140/90 12/29/2012  . Anemia 12/29/2012    Past Surgical History:  Procedure  Laterality Date  . ABDOMINAL HYSTERECTOMY     patient still has ovaries  . BREAST REDUCTION SURGERY Bilateral 08/05/2017   Procedure: BREAST REDUCTION WITH LIPOSUCTION;  Surgeon: Louisa Second, MD;  Location: [facility];  Service: Plastics;  Laterality: Bilateral;  . CHOLECYSTECTOMY    . COLONOSCOPY WITH PROPOFOL N/A 03/26/2018   Procedure: COLONOSCOPY WITH PROPOFOL;  Surgeon: Wyline Mood, MD;  Location: [facility] ENDOSCOPY;  Service: Gastroenterology;  Laterality: N/A;  . ESOPHAGOGASTRODUODENOSCOPY (EGD) WITH PROPOFOL N/A 03/26/2018   Procedure: ESOPHAGOGASTRODUODENOSCOPY (EGD) WITH PROPOFOL;  Surgeon: Wyline Mood, MD;  Location: [facility] ENDOSCOPY;  Service: Gastroenterology;  Laterality: N/A;  . GIVENS CAPSULE STUDY N/A 04/14/2018   Procedure: GIVENS CAPSULE STUDY;  Surgeon: Wyline Mood, MD;  Location: [facility] ENDOSCOPY;  Service: Gastroenterology;  Laterality: N/A;  . SHOULDER ARTHROSCOPY Right   . TUBAL LIGATION      Prior to Admission medications   Medication Sig Start Date End Date Taking? Authorizing Provider  albuterol (VENTOLIN HFA) 108 (90 Base) MCG/ACT inhaler Inhale 2 puffs into the lungs every 4 (four) hours as needed for wheezing or shortness of breath. 04/21/20  Yes Famous Eisenhardt, Delorise Royals, PA-C  benzonatate (TESSALON PERLES) 100 MG capsule Take 1 capsule (100 mg total) by mouth every 6 (six) hours as needed. 04/21/20 04/21/21 Yes Kingslee Mairena, Delorise Royals, PA-C  brompheniramine-pseudoephedrine-DM 30-2-10 MG/5ML syrup Take 10 mLs by mouth 4 (four) times daily as needed. 04/21/20  Yes Ayomide Zuleta, Delorise Royals, PA-C  ondansetron (ZOFRAN-ODT) 4 MG disintegrating tablet Take 1 tablet (4 mg total) by mouth every 8 (eight) hours as needed for nausea  or vomiting. 04/21/20  Yes Makeshia Seat, Charline Bills, PA-C  predniSONE (DELTASONE) 50 MG tablet Take 1 tablet (50 mg total) by mouth daily with breakfast. 04/21/20  Yes Duard Spiewak, Charline Bills, PA-C  cloNIDine (CATAPRES) 0.1 MG tablet TAKE 1 TABLET  (0.1 MG TOTAL) BY MOUTH AT BEDTIME. 01/28/20   Sowles, Drue Stager, MD  fluticasone (FLONASE) 50 MCG/ACT nasal spray Place 2 sprays into both nostrils daily as needed. 03/29/20   Delsa Grana, PA-C  hydrochlorothiazide (HYDRODIURIL) 25 MG tablet Take 1 tablet (25 mg total) by mouth daily. 02/26/20 03/27/20  Steele Sizer, MD  meloxicam (MOBIC) 7.5 MG tablet Take 1 tablet (7.5 mg total) by mouth daily. Patient not taking: Reported on 03/29/2020 06/05/19   Delsa Grana, PA-C  mupirocin ointment (BACTROBAN) 2 % Apply 1 application topically 2 (two) times daily. 03/29/20   Delsa Grana, PA-C    Allergies Percocet [oxycodone-acetaminophen]  Family History  Problem Relation Age of Onset  . Hyperthyroidism Sister   . Alcohol abuse Brother     Social History Social History   Tobacco Use  . Smoking status: Never Smoker  . Smokeless tobacco: Never Used  Vaping Use  . Vaping Use: Never used  Substance Use Topics  . Alcohol use: Yes    Alcohol/week: 0.0 standard drinks    Comment: socially  . Drug use: No     Review of Systems  Constitutional: Positive for fevers and chills.  Positive for body aches Eyes: No visual changes. No discharge ENT: Positive for nasal congestion and sore throat Cardiovascular: no chest pain. Respiratory: Positive cough. No SOB. Gastrointestinal: No abdominal pain.  No nausea, no vomiting.  No diarrhea.  No constipation. Musculoskeletal: Negative for musculoskeletal pain. Skin: Negative for rash, abrasions, lacerations, ecchymosis. Neurological: Negative for headaches, focal weakness or numbness.  10 System ROS otherwise negative.  ____________________________________________   PHYSICAL EXAM:  VITAL SIGNS: ED Triage Vitals [04/21/20 1727]  Enc Vitals Group     BP (!) 164/96     Pulse Rate 87     Resp 18     Temp 98.7 F (37.1 C)     Temp Source Oral     SpO2 100 %     Weight 220 lb (99.8 kg)     Height 5\' 2"  (1.575 m)     Head Circumference       Peak Flow      Pain Score 8     Pain Loc      Pain Edu?      Excl. in [place]?      Constitutional: Alert and oriented. Well appearing and in no acute distress. Eyes: Conjunctivae are normal. PERRL. EOMI. Head: Atraumatic. ENT:      Ears: EACs and TMs unremarkable bilaterally.      Nose: Moderate congestion/rhinnorhea.      Mouth/Throat: Mucous membranes are moist.  Oropharynx is mildly erythematous but nonedematous.  Uvula is midline. Neck: No stridor.  Hematological/Lymphatic/Immunilogical: No cervical lymphadenopathy. Cardiovascular: Normal rate, regular rhythm. Normal S1 and S2.  Good peripheral circulation. Respiratory: Normal respiratory effort without tachypnea or retractions. Lungs CTAB. Good air entry to the bases with no decreased or absent breath sounds. Gastrointestinal: Bowel sounds 4 quadrants. Soft and nontender to palpation. No guarding or rigidity. No palpable masses. No distention. No CVA tenderness Musculoskeletal: Full range of motion to all extremities. No gross deformities appreciated. Neurologic:  Normal speech and language. No gross focal neurologic deficits are appreciated.  Skin:  Skin is warm, dry and intact.  No rash noted. Psychiatric: Mood and affect are normal. Speech and behavior are normal. Patient exhibits appropriate insight and judgement.   ____________________________________________   LABS (all labs ordered are listed, but only abnormal results are displayed)  Labs Reviewed  POC SARS CORONAVIRUS 2 AG -  ED   ____________________________________________  EKG   ____________________________________________  RADIOLOGY I personally viewed and evaluated these images as part of my medical decision making, as well as reviewing the written report by the radiologist.  ED Provider Interpretation: Concur with no consolidation concerning for pneumonia otherwise reassuring chest x-ray  DG Chest 1 View  Result Date: 04/21/2020 CLINICAL DATA:  Cough  and fever EXAM: CHEST  1 VIEW COMPARISON:  08/05/2019 FINDINGS: Borderline cardiomegaly. Both lungs are clear. The visualized skeletal structures are unremarkable. IMPRESSION: No active disease. Electronically Signed   By: Donavan Foil M.D.   On: 04/21/2020 20:29    ____________________________________________    PROCEDURES  Procedure(s) performed:    Procedures    Medications - No data to display   ____________________________________________   INITIAL IMPRESSION / ASSESSMENT AND PLAN / ED COURSE  Pertinent labs & imaging results that were available during my care of the patient were reviewed by me and considered in my medical decision making (see chart for details).  Review of the [place] CSRS was performed in accordance of the [place] prior to dispensing any controlled drugs.           Patient's diagnosis is consistent with COVID-19.  Patient presented to the emergency department complaining of symptoms consistent with COVID-19.  Rapid test was negative, chest x-ray is reassuring.  However based off of the prevalence, symptoms, physical exam I feel that patient does in fact have COVID-19.  I have prescribed the patient symptom control medications include steroid, albuterol, Bromfed cough syrup, Tessalon Perles and Zofran.  Quarantine precautions discussed with the patient.  Follow-up primary care as needed.  Return precautions discussed with the patient.. Patient is given ED precautions to return to the ED for any worsening or new symptoms.     ____________________________________________  FINAL CLINICAL IMPRESSION(S) / ED DIAGNOSES  Final diagnoses:  COVID-19      NEW MEDICATIONS STARTED DURING THIS VISIT:  ED Discharge Orders         Ordered    predniSONE (DELTASONE) 50 MG tablet  Daily with breakfast        04/21/20 2107    albuterol (VENTOLIN HFA) 108 (90 Base) MCG/ACT inhaler  Every 4 hours PRN        04/21/20 2107    brompheniramine-pseudoephedrine-DM 30-2-10  MG/5ML syrup  4 times daily PRN        04/21/20 2107    benzonatate (TESSALON PERLES) 100 MG capsule  Every 6 hours PRN        04/21/20 2107    ondansetron (ZOFRAN-ODT) 4 MG disintegrating tablet  Every 8 hours PRN        04/21/20 2107              This chart was dictated using voice recognition software/Dragon. Despite best efforts to proofread, errors can occur which can change the meaning. Any change was purely unintentional.    Darletta Moll, PA-C 04/21/20 2108    Nena Polio, MD 04/21/20 [phone]

## 2020-04-21 NOTE — ED Triage Notes (Signed)
Pt comes into the ED via POV c/o cough and body aches that started yesterday.  Pt states her son had COVID at the beginning of this month.  Pt has good saturation levels and even and unlabored respirations.  Denies any known fevers at this time.

## 2020-05-18 ENCOUNTER — Encounter: Payer: Self-pay | Admitting: Emergency Medicine

## 2020-05-18 ENCOUNTER — Emergency Department: Payer: BC Managed Care – PPO

## 2020-05-18 ENCOUNTER — Emergency Department
Admission: EM | Admit: 2020-05-18 | Discharge: 2020-05-18 | Disposition: A | Discharge status: Home / Self Care | Payer: BC Managed Care – PPO | Attending: Emergency Medicine | Admitting: Emergency Medicine

## 2020-05-18 ENCOUNTER — Ambulatory Visit: Payer: Self-pay | Admitting: *Deleted

## 2020-05-18 ENCOUNTER — Other Ambulatory Visit: Payer: Self-pay

## 2020-05-18 DIAGNOSIS — Z79899 Other long term (current) drug therapy: Secondary | ICD-10-CM | POA: Diagnosis not present

## 2020-05-18 DIAGNOSIS — H9203 Otalgia, bilateral: Secondary | ICD-10-CM | POA: Insufficient documentation

## 2020-05-18 DIAGNOSIS — I10 Essential (primary) hypertension: Secondary | ICD-10-CM | POA: Insufficient documentation

## 2020-05-18 DIAGNOSIS — U099 Post covid-19 condition, unspecified: Secondary | ICD-10-CM | POA: Insufficient documentation

## 2020-05-18 DIAGNOSIS — J069 Acute upper respiratory infection, unspecified: Secondary | ICD-10-CM | POA: Diagnosis not present

## 2020-05-18 DIAGNOSIS — R0981 Nasal congestion: Secondary | ICD-10-CM | POA: Diagnosis present

## 2020-05-18 DIAGNOSIS — R059 Cough, unspecified: Secondary | ICD-10-CM | POA: Diagnosis not present

## 2020-05-18 DIAGNOSIS — R0602 Shortness of breath: Secondary | ICD-10-CM | POA: Diagnosis not present

## 2020-05-18 LAB — CBC
HCT: 39.3 % (ref 36.0–46.0)
Hemoglobin: 12.9 g/dL (ref 12.0–15.0)
MCH: 28.2 pg (ref 26.0–34.0)
MCHC: 32.8 g/dL (ref 30.0–36.0)
MCV: 85.8 fL (ref 80.0–100.0)
Platelets: 341 10*3/uL (ref 150–400)
RBC: 4.58 MIL/uL (ref 3.87–5.11)
RDW: 15.2 % (ref 11.5–15.5)
WBC: 7.5 10*3/uL (ref 4.0–10.5)
nRBC: 0 % (ref 0.0–0.2)

## 2020-05-18 LAB — BASIC METABOLIC PANEL
Anion gap: 9 (ref 5–15)
BUN: 13 mg/dL (ref 6–20)
CO2: 29 mmol/L (ref 22–32)
Calcium: 9.4 mg/dL (ref 8.9–10.3)
Chloride: 102 mmol/L (ref 98–111)
Creatinine, Ser: 0.69 mg/dL (ref 0.44–1.00)
GFR, Estimated: >60 mL/min (ref >60)
Glucose, Bld: 89 mg/dL (ref 70–99)
Potassium: 4.5 mmol/L (ref 3.5–5.1)
Sodium: 140 mmol/L (ref 135–145)

## 2020-05-18 MED ORDER — AZITHROMYCIN 250 MG PO TABS: ORAL_TABLET | ORAL | 0 refills | Status: DC

## 2020-05-18 NOTE — Telephone Encounter (Signed)
Advised ED now- patient is SOB, wheezing, shallow breathing- labored to talk. Did not finish triage due to patient SOB- advised ED now- she has someone to drive her.  Reason for Disposition . Wheezing can be heard across the room  Answer Assessment - Initial Assessment Questions 1. RESPIRATORY STATUS: "Describe your breathing?" (e.g., wheezing, shortness of breath, unable to speak, severe coughing)      SOB- shallow, not able to get full breath 2. ONSET: "When did this breathing problem begin?"      Friday 3. PATTERN "Does the difficult breathing come and go, or has it been constant since it started?"      constant 4. SEVERITY: "How bad is your breathing?" (e.g., mild, moderate, severe)    - MILD: No SOB at rest, mild SOB with walking, speaks normally in sentences, can lay down, no retractions, pulse < 100.    - MODERATE: SOB at rest, SOB with minimal exertion and prefers to sit, cannot lie down flat, speaks in phrases, mild retractions, audible wheezing, pulse 100-120.    - SEVERE: Very SOB at rest, speaks in single words, struggling to breathe, sitting hunched forward, retractions, pulse > 120      severe 5. RECURRENT SYMPTOM: "Have you had difficulty breathing before?" If Yes, ask: "When was the last time?" and "What happened that time?"      no  Protocols used: BREATHING DIFFICULTY-A-AH

## 2020-05-18 NOTE — ED Provider Notes (Signed)
[facility] Emergency Department Provider Note  ____________________________________________  Time seen: Approximately 4:55 PM  I have reviewed the triage vital signs and the nursing notes.   HISTORY  Chief Complaint Cough and Shortness of Breath    HPI Wendy Kent is a 50 y.o. female that presents to the emergency department for evaluation of bilateral ear pain, nasal congestion, sore throat, productive cough with yellow sputum, shortness of breath, diarrhea for 4 days.  Patient states that she was diagnosed with Covid in this ED on December 30.  She was out of work for about 2.5 weeks.  Her symptoms all improved.  She returned to work last week.  At the end of the week, she developed new URI symptoms.  She is unaware of any fevers.   Past Medical History:  Diagnosis Date  . Allergy   . Blood transfusion without reported diagnosis   . BP (high blood pressure) 12/29/2012  . Breast hypertrophy 11/10/2014  . GERD (gastroesophageal reflux disease)   . Thyroid disease    patient was cleared by specialist. History of Thyroid nodules.    Patient Active Problem List   Diagnosis Date Noted  . Fatty liver 02/26/2020  . Lymphedema 01/21/2019  . Varicose veins of leg with pain, bilateral 10/14/2018  . Obesity (BMI 35.0-39.9 without comorbidity) 09/12/2017  . Lactose intolerance 09/26/2015  . IBS (irritable bowel syndrome) 09/26/2015  . Multinodular goiter 09/28/2014  . Hyperthyroidism, subclinical 09/28/2014  . Allergic rhinitis, seasonal 09/28/2014  . Hypertension goal BP (blood pressure) < 140/90 12/29/2012  . Anemia 12/29/2012    Past Surgical History:  Procedure Laterality Date  . ABDOMINAL HYSTERECTOMY     patient still has ovaries  . BREAST REDUCTION SURGERY Bilateral 08/05/2017   Procedure: BREAST REDUCTION WITH LIPOSUCTION;  Surgeon: Cristine Polio, MD;  Location: [facility];  Service: Plastics;  Laterality: Bilateral;  .  CHOLECYSTECTOMY    . COLONOSCOPY WITH PROPOFOL N/A 03/26/2018   Procedure: COLONOSCOPY WITH PROPOFOL;  Surgeon: Jonathon Bellows, MD;  Location: [facility] ENDOSCOPY;  Service: Gastroenterology;  Laterality: N/A;  . ESOPHAGOGASTRODUODENOSCOPY (EGD) WITH PROPOFOL N/A 03/26/2018   Procedure: ESOPHAGOGASTRODUODENOSCOPY (EGD) WITH PROPOFOL;  Surgeon: Jonathon Bellows, MD;  Location: [facility] ENDOSCOPY;  Service: Gastroenterology;  Laterality: N/A;  . GIVENS CAPSULE STUDY N/A 04/14/2018   Procedure: GIVENS CAPSULE STUDY;  Surgeon: Jonathon Bellows, MD;  Location: [facility] ENDOSCOPY;  Service: Gastroenterology;  Laterality: N/A;  . SHOULDER ARTHROSCOPY Right   . TUBAL LIGATION      Prior to Admission medications   Medication Sig Start Date End Date Taking? Authorizing Provider  azithromycin (ZITHROMAX Z-PAK) 250 MG tablet Take 2 tablets (500 mg) on  Day 1,  followed by 1 tablet (250 mg) once daily on Days 2 through 5. 05/18/20  Yes Laban Emperor, PA-C  albuterol (VENTOLIN HFA) 108 (90 Base) MCG/ACT inhaler Inhale 2 puffs into the lungs every 4 (four) hours as needed for wheezing or shortness of breath. 04/21/20   Cuthriell, Charline Bills, PA-C  benzonatate (TESSALON PERLES) 100 MG capsule Take 1 capsule (100 mg total) by mouth every 6 (six) hours as needed. 04/21/20 04/21/21  Cuthriell, Charline Bills, PA-C  brompheniramine-pseudoephedrine-DM 30-2-10 MG/5ML syrup Take 10 mLs by mouth 4 (four) times daily as needed. 04/21/20   Cuthriell, Charline Bills, PA-C  cloNIDine (CATAPRES) 0.1 MG tablet TAKE 1 TABLET (0.1 MG TOTAL) BY MOUTH AT BEDTIME. 01/28/20   Sowles, Drue Stager, MD  fluticasone (FLONASE) 50 MCG/ACT nasal spray Place 2 sprays into both  nostrils daily as needed. 03/29/20   Delsa Grana, PA-C  hydrochlorothiazide (HYDRODIURIL) 25 MG tablet Take 1 tablet (25 mg total) by mouth daily. 02/26/20 03/27/20  Steele Sizer, MD  meloxicam (MOBIC) 7.5 MG tablet Take 1 tablet (7.5 mg total) by mouth daily. Patient not taking: Reported on 03/29/2020  06/05/19   Delsa Grana, PA-C  mupirocin ointment (BACTROBAN) 2 % Apply 1 application topically 2 (two) times daily. 03/29/20   Delsa Grana, PA-C  ondansetron (ZOFRAN-ODT) 4 MG disintegrating tablet Take 1 tablet (4 mg total) by mouth every 8 (eight) hours as needed for nausea or vomiting. 04/21/20   Cuthriell, Charline Bills, PA-C  predniSONE (DELTASONE) 50 MG tablet Take 1 tablet (50 mg total) by mouth daily with breakfast. 04/21/20   Cuthriell, Charline Bills, PA-C    Allergies Percocet [oxycodone-acetaminophen]  Family History  Problem Relation Age of Onset  . Hyperthyroidism Sister   . Alcohol abuse Brother     Social History Social History   Tobacco Use  . Smoking status: Never Smoker  . Smokeless tobacco: Never Used  Vaping Use  . Vaping Use: Never used  Substance Use Topics  . Alcohol use: Yes    Alcohol/week: 0.0 standard drinks    Comment: socially  . Drug use: No     Review of Systems  Constitutional: No fever/chills Eyes: No visual changes. No discharge. ENT: Positive for congestion and rhinorrhea.  Positive for sore throat. Cardiovascular: No chest pain. Respiratory: Positive for cough and SOB. Gastrointestinal: No abdominal pain.  No nausea, no vomiting.  Positive for diarrhea.  No constipation. Musculoskeletal: Positive for body aches. Skin: Negative for rash, abrasions, lacerations, ecchymosis. Neurological: Positive for headache.   ____________________________________________   PHYSICAL EXAM:  VITAL SIGNS: ED Triage Vitals [05/18/20 1328]  Enc Vitals Group     BP 106/90     Pulse Rate 96     Resp 18     Temp 98.2 F (36.8 C)     Temp Source Oral     SpO2 100 %     Weight 215 lb (97.5 kg)     Height 5\' 2"  (1.575 m)     Head Circumference      Peak Flow      Pain Score 9     Pain Loc      Pain Edu?      Excl. in [place]?      Constitutional: Alert and oriented. Well appearing and in no acute distress. Eyes: Conjunctivae are normal. PERRL. EOMI.  No discharge. Head: Atraumatic. ENT: No frontal and maxillary sinus tenderness.      Ears: Tympanic membranes pearly gray with good landmarks. No discharge.      Nose: Mild congestion/rhinnorhea.      Mouth/Throat: Mucous membranes are moist. Oropharynx non-erythematous. Tonsils not enlarged. No exudates. Uvula midline. Neck: No stridor.   Hematological/Lymphatic/Immunilogical: No cervical lymphadenopathy. Cardiovascular: Normal rate, regular rhythm.  Good peripheral circulation. Respiratory: Normal respiratory effort without tachypnea or retractions. Lungs CTAB. Good air entry to the bases with no decreased or absent breath sounds. Gastrointestinal: Bowel sounds 4 quadrants. Soft and nontender to palpation. No guarding or rigidity. No palpable masses. No distention. Musculoskeletal: Full range of motion to all extremities. No gross deformities appreciated. Neurologic:  Normal speech and language. No gross focal neurologic deficits are appreciated.  Skin:  Skin is warm, dry and intact. No rash noted. Psychiatric: Mood and affect are normal. Speech and behavior are normal. Patient exhibits appropriate insight and judgement.  ____________________________________________   LABS (all labs ordered are listed, but only abnormal results are displayed)  Labs Reviewed  CBC  BASIC METABOLIC PANEL   ____________________________________________  EKG   ____________________________________________  RADIOLOGY Robinette Haines, personally viewed and evaluated these images (plain radiographs) as part of my medical decision making, as well as reviewing the written report by the radiologist.  DG Chest 2 View  Result Date: 05/18/2020 CLINICAL DATA:  Shortness of breath, tired, coughing, [place], had COVID-19 in December 2021 EXAM: CHEST - 2 VIEW COMPARISON:  04/21/2020 FINDINGS: Normal heart size, mediastinal contours, and pulmonary vascularity. Mild tortuosity of thoracic aorta. Lungs clear. No  pulmonary infiltrate, pleural effusion, or pneumothorax. Multilevel endplate spur formation thoracic spine. IMPRESSION: No acute abnormalities. Electronically Signed   By: Lavonia Dana M.D.   On: 05/18/2020 14:18    ____________________________________________    PROCEDURES  Procedure(s) performed:    Procedures    Medications - No data to display   ____________________________________________   INITIAL IMPRESSION / ASSESSMENT AND PLAN / ED COURSE  Pertinent labs & imaging results that were available during my care of the patient were reviewed by me and considered in my medical decision making (see chart for details).  Review of the [place] CSRS was performed in accordance of the [place] prior to dispensing any controlled drugs.   Patient presented to the emergency department for evaluation of new URI symptoms after improving from COVID-19. Vital signs and exam are reassuring.  Patient will be covered for a secondary bacterial infection post COVID-19.  Patient appears well and is staying well hydrated. Patient should alternate tylenol and ibuprofen for fever. Patient feels comfortable going home. Patient will be discharged home with prescriptions for azithromycin. Patient is to follow up with PCP as needed or otherwise directed. Patient is given ED precautions to return to the ED for any worsening or new symptoms.   Wendy Kent was evaluated in Emergency Department on 05/18/2020 for the symptoms described in the history of present illness. She was evaluated in the context of the global COVID-19 pandemic, which necessitated consideration that the patient might be at risk for infection with the SARS-CoV-2 virus that causes COVID-19. Institutional protocols and algorithms that pertain to the evaluation of patients at risk for COVID-19 are in a state of rapid change based on information released by regulatory bodies including the CDC and federal and state organizations. These policies and  algorithms were followed during the patient's care in the ED.  ____________________________________________  FINAL CLINICAL IMPRESSION(S) / ED DIAGNOSES  Final diagnoses:  Upper respiratory tract infection, unspecified type      NEW MEDICATIONS STARTED DURING THIS VISIT:  ED Discharge Orders         Ordered    azithromycin (ZITHROMAX Z-PAK) 250 MG tablet        05/18/20 1657              This chart was dictated using voice recognition software/Dragon. Despite best efforts to proofread, errors can occur which can change the meaning. Any change was purely unintentional.    Laban Emperor, PA-C 05/19/20 0000    Blake Divine, MD 05/19/20 [phone]

## 2020-05-18 NOTE — ED Notes (Signed)
Pt complained during triage that "I might pass out." Vss, pt encouraged to stay seated until called, wheelchair given.

## 2020-05-18 NOTE — ED Triage Notes (Signed)
Pt states covid in December, just went back to work last week, now feeling tired, shob, coughing and achy. NAD. VSS.

## 2020-05-19 ENCOUNTER — Encounter: Payer: Self-pay | Admitting: Family Medicine

## 2020-05-19 DIAGNOSIS — I771 Stricture of artery: Secondary | ICD-10-CM | POA: Insufficient documentation

## 2020-05-24 DIAGNOSIS — Z20822 Contact with and (suspected) exposure to covid-19: Secondary | ICD-10-CM | POA: Diagnosis not present

## 2020-05-24 DIAGNOSIS — Z03818 Encounter for observation for suspected exposure to other biological agents ruled out: Secondary | ICD-10-CM | POA: Diagnosis not present

## 2020-05-25 ENCOUNTER — Telehealth (INDEPENDENT_AMBULATORY_CARE_PROVIDER_SITE_OTHER): Payer: BC Managed Care – PPO | Admitting: Family Medicine

## 2020-05-25 ENCOUNTER — Encounter: Payer: Self-pay | Admitting: Family Medicine

## 2020-05-25 DIAGNOSIS — R0981 Nasal congestion: Secondary | ICD-10-CM | POA: Diagnosis not present

## 2020-05-25 DIAGNOSIS — Z8616 Personal history of COVID-19: Secondary | ICD-10-CM

## 2020-05-25 DIAGNOSIS — R519 Headache, unspecified: Secondary | ICD-10-CM | POA: Diagnosis not present

## 2020-05-25 MED ORDER — AZELASTINE HCL 0.1 % NA SOLN: 1.0000 | Freq: Two times a day (BID) | NASAL | 0 refills | Status: DC

## 2020-05-25 NOTE — Progress Notes (Signed)
Name: Wendy Kent   MRN: [id number]    DOB: [birth year]   Date:05/25/2020       Progress Note  Subjective  Chief Complaint  ER Follow Up  I connected with  Driscilla Grammes  on 05/25/20 at 10:40 AM EST by a video enabled telemedicine application and verified that I am speaking with the correct person using two identifiers.  I discussed the limitations of evaluation and management by telemedicine and the availability of in person appointments. The patient expressed understanding and agreed to proceed with the virtual visit  Staff also discussed with the patient that there may be a patient responsible charge related to this service. Patient Location: at home  Provider Location: [facility]  Additional Individuals present: alone   HPI  COVID -19 diagnosed clinically at [facility] on 04/21/2020, she had rapid onset of headache, sore throat, body aches, fatigue and cough. 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 CXR was negative, she was given a Zpack, has been using Flonase. She has not been back to work since January 26 th, 2022. She states she still does not feel good, her head is congested . She states.. I just don't feel back to normal. Explained it may take time, seems like it is a second infection ( viral ) after original covid infection  Tortuous Aorta: discussed CXR results, we may need to start statin therapy  Patient Active Problem List   Diagnosis Date Noted  . Tortuous aorta ([place]) 05/19/2020  . Fatty liver 02/26/2020  . Lymphedema 01/21/2019  . Varicose veins of leg with pain, bilateral 10/14/2018  . Obesity (BMI 35.0-39.9 without comorbidity) 09/12/2017  . Lactose intolerance 09/26/2015  . IBS (irritable bowel syndrome) 09/26/2015  . Multinodular goiter 09/28/2014  . Hyperthyroidism, subclinical 09/28/2014  . Allergic rhinitis, seasonal 09/28/2014   . Hypertension goal BP (blood pressure) < 140/90 12/29/2012  . Anemia 12/29/2012    Past Surgical History:  Procedure Laterality Date  . ABDOMINAL HYSTERECTOMY     patient still has ovaries  . BREAST REDUCTION SURGERY Bilateral 08/05/2017   Procedure: BREAST REDUCTION WITH LIPOSUCTION;  Surgeon: Cristine Polio, MD;  Location: [place];  Service: Plastics;  Laterality: Bilateral;  . CHOLECYSTECTOMY    . COLONOSCOPY WITH PROPOFOL N/A 03/26/2018   Procedure: COLONOSCOPY WITH PROPOFOL;  Surgeon: Jonathon Bellows, MD;  Location: [facility] ENDOSCOPY;  Service: Gastroenterology;  Laterality: N/A;  . ESOPHAGOGASTRODUODENOSCOPY (EGD) WITH PROPOFOL N/A 03/26/2018   Procedure: ESOPHAGOGASTRODUODENOSCOPY (EGD) WITH PROPOFOL;  Surgeon: Jonathon Bellows, MD;  Location: [facility] ENDOSCOPY;  Service: Gastroenterology;  Laterality: N/A;  . GIVENS CAPSULE STUDY N/A 04/14/2018   Procedure: GIVENS CAPSULE STUDY;  Surgeon: Jonathon Bellows, MD;  Location: [facility] ENDOSCOPY;  Service: Gastroenterology;  Laterality: N/A;  . SHOULDER ARTHROSCOPY Right   . TUBAL LIGATION      Family History  Problem Relation Age of Onset  . Hyperthyroidism Sister   . Alcohol abuse Brother     Social History   Socioeconomic History  . Marital status: Single    Spouse name: Not on file  . Number of children: 4  . Years of education: 42  . Highest education level: Associate degree: academic program  Occupational History  . Occupation: TEFL teacher    Comment: [employer]  Tobacco Use  . Smoking status: Never Smoker  . Smokeless tobacco: Never Used  Vaping Use  . Vaping Use: Never used  Substance and Sexual Activity  . Alcohol use: Yes    Alcohol/week: 0.0 standard drinks    Comment: socially  . Drug use: No  . Sexual activity: Not Currently    Birth control/protection: Surgical  Other Topics Concern  . Not on file  Social History Narrative  . Not on file   Social Determinants of Health   Financial Resource  Strain: Not on file  Food Insecurity: Not on file  Transportation Needs: Not on file  Physical Activity: Not on file  Stress: Not on file  Social Connections: Not on file  Intimate Partner Violence: Not on file     Current Outpatient Medications:  .  albuterol (VENTOLIN HFA) 108 (90 Base) MCG/ACT inhaler, Inhale 2 puffs into the lungs every 4 (four) hours as needed for wheezing or shortness of breath., Disp: 1 each, Rfl: 0 .  azelastine (ASTELIN) 0.1 % nasal spray, Place 1 spray into both nostrils 2 (two) times daily. Use in each nostril as directed, Disp: 30 mL, Rfl: 0 .  benzonatate (TESSALON PERLES) 100 MG capsule, Take 1 capsule (100 mg total) by mouth every 6 (six) hours as needed., Disp: 30 capsule, Rfl: 0 .  cloNIDine (CATAPRES) 0.1 MG tablet, TAKE 1 TABLET (0.1 MG TOTAL) BY MOUTH AT BEDTIME., Disp: 90 tablet, Rfl: 1 .  fluticasone (FLONASE) 50 MCG/ACT nasal spray, Place 2 sprays into both nostrils daily as needed., Disp: 48 mL, Rfl: 3 .  hydrochlorothiazide (HYDRODIURIL) 25 MG tablet, Take 1 tablet (25 mg total) by mouth daily., Disp: 90 tablet, Rfl: 1  Allergies  Allergen Reactions  . Percocet [Oxycodone-Acetaminophen] Anaphylaxis    I personally reviewed medication list, allergies, lab results with the patient/caregiver today.   ROS   Ten systems reviewed and is negative except as mentioned in HPI   Objective  Virtual encounter, vitals not obtained.  There is no height or weight on file to calculate BMI.  Physical Exam  Awake, alert and oriented, speaking in full sentences normal speech   PHQ2/9: Depression screen [facility] 2/9 05/25/2020 03/29/2020 02/26/2020 08/26/2019 07/14/2019  Decreased Interest 0 0 0 0 0  Down, Depressed, Hopeless 0 0 0 0 0  PHQ - 2 Score 0 0 0 0 0  Altered sleeping - - - 0 0  Tired, decreased energy - - - 0 0  Change in appetite - - - 0 0  Feeling bad or failure about yourself  - - - 0 0  Trouble concentrating - - - 0 0  Moving slowly or  fidgety/restless - - - 0 0  Suicidal thoughts - - - 0 0  PHQ-9 Score - - - 0 0  Difficult doing work/chores - - - - Not difficult at all  Some recent data might be hidden   PHQ-2/9 Result is negative.    Fall Risk: Fall Risk  05/25/2020 03/29/2020 02/26/2020 08/26/2019 07/14/2019  Falls in the past year? 0 0 0 0 0  Comment - - - - -  Number falls in past yr: 0 0 0 0 0  Injury with Fall? 0 0 0 0 0  Follow up - Falls evaluation completed - - -     Assessment & Plan  1. History of COVID-19   2. Nonintractable headache, unspecified chronicity pattern, unspecified headache type  Advised fluids and tylenol   3. Nasal congestion  - azelastine (ASTELIN) 0.1 % nasal spray; Place 1 spray into both nostrils  2 (two) times daily. Use in each nostril as directed  Dispense: 30 mL; Refill: 0 Try mucinex,  Explained that I understand she is not feeling well, but since she works from home, had normal labs, CXR, pulse ox, I can give her reduced work hours or try to go back full time and discuss with supervisor the ability to take frequent breaks during the day.    I discussed the assessment and treatment plan with the patient. The patient was provided an opportunity to ask questions and all were answered. The patient agreed with the plan and demonstrated an understanding of the instructions.  The patient was advised to call back or seek an in-person evaluation if the symptoms worsen or if the condition fails to improve as anticipated.  I provided 15  minutes of non-face-to-face time during this encounter.

## 2020-06-07 ENCOUNTER — Telehealth: Payer: Self-pay

## 2020-06-07 NOTE — Telephone Encounter (Signed)
Pt is requesting to have her son Allyson Sabal pick up the paperwork because she is working until 5. Please advise

## 2020-06-07 NOTE — Telephone Encounter (Signed)
Copied from [place] [id number]. Topic: General - Other >> Jun 07, 2020 12:44 PM Mcneil, Ja-Kwan wrote: Reason for CRM: Pt stated she received a call that her paperwork was ready but she is not sure if the paperwork has the  fax# to [place]. Pt requests that the paperwork be faxed to [facility] if the fax # is listed on the paperwork.

## 2020-06-20 ENCOUNTER — Other Ambulatory Visit: Payer: Self-pay | Admitting: Family Medicine

## 2020-06-20 DIAGNOSIS — R0981 Nasal congestion: Secondary | ICD-10-CM

## 2020-06-23 ENCOUNTER — Other Ambulatory Visit: Payer: Self-pay | Admitting: Family Medicine

## 2020-06-23 DIAGNOSIS — I10 Essential (primary) hypertension: Secondary | ICD-10-CM

## 2020-06-23 DIAGNOSIS — R600 Localized edema: Secondary | ICD-10-CM

## 2020-06-24 ENCOUNTER — Other Ambulatory Visit: Payer: Self-pay

## 2020-06-24 MED ORDER — HYDROCHLOROTHIAZIDE 25 MG PO TABS
25.0000 mg | ORAL_TABLET | Freq: Every day | ORAL | 0 refills | Status: DC
Start: 2020-06-24 — End: 2020-11-17

## 2020-06-26 ENCOUNTER — Encounter: Payer: Self-pay | Admitting: Family Medicine

## 2020-06-28 ENCOUNTER — Other Ambulatory Visit: Payer: Self-pay | Admitting: Family Medicine

## 2020-06-28 DIAGNOSIS — R0981 Nasal congestion: Secondary | ICD-10-CM

## 2020-06-29 DIAGNOSIS — M13811 Other specified arthritis, right shoulder: Secondary | ICD-10-CM | POA: Diagnosis not present

## 2020-06-29 DIAGNOSIS — M19011 Primary osteoarthritis, right shoulder: Secondary | ICD-10-CM | POA: Diagnosis not present

## 2020-08-01 ENCOUNTER — Other Ambulatory Visit: Payer: Self-pay | Admitting: Family Medicine

## 2020-08-01 DIAGNOSIS — R232 Flushing: Secondary | ICD-10-CM

## 2020-08-09 ENCOUNTER — Other Ambulatory Visit: Payer: Self-pay

## 2020-08-09 NOTE — Progress Notes (Signed)
Sleep study ordered expired- needs a new one

## 2020-08-26 ENCOUNTER — Ambulatory Visit: Payer: BC Managed Care – PPO | Admitting: Family Medicine

## 2020-10-06 ENCOUNTER — Ambulatory Visit: Payer: BC Managed Care – PPO | Admitting: Family Medicine

## 2020-10-14 ENCOUNTER — Ambulatory Visit: Payer: Self-pay | Admitting: *Deleted

## 2020-10-14 NOTE — Telephone Encounter (Signed)
Reason for Disposition  Patient sounds very upset or troubled to the triager    Pt pulling over to side of road.   Was driving when she called in.   Calling 911.  Feels like she's going to pass out.  Answer Assessment - Initial Assessment Questions 1. CONCERN: "Did anything happen that prompted you to call today?"      My baby boy just graduated from high school.   My son in [place].   My head feels like it' about to bust.   "I just can't seem to get it together".    2. ANXIETY SYMPTOMS: "Can you describe how you (your loved one; patient) have been feeling?" (e.g., tense, restless, panicky, anxious, keyed up, overwhelmed, sense of impending doom).      It's really stressful.   I'm on the way to work.     My head is hurting is so bad.   "I just don't feel right".    "Everyone is leaving". 3. ONSET: "How long have you been feeling this way?" (e.g., hours, days, weeks)     Anxiety started before my son graduated. 4. SEVERITY: "How would you rate the level of anxiety?" (e.g., 0 - 10; or mild, moderate, severe).     I just don't feel right.   "It's just going to be me now".   "I'm having panic attacks".   The headache is new.   I didn't sleep all night.   I haven't had a headache like this in several years.   My whole body hurts.   I haven't done a Covid test.   I don't have one.   I will do a Covid when I get to work.    I started coughing yesterday.   "My mind is not right".    5. FUNCTIONAL IMPAIRMENT: "How have these feelings affected your ability to do daily activities?" "Have you had more difficulty than usual doing your normal daily activities?" (e.g., getting better, same, worse; self-care, school, work, interactions)     "My thinking is just not right"   "I feel foggy and my head hurts really bad".   6. HISTORY: "Have you felt this way before?" "Have you ever been diagnosed with an anxiety problem in the past?" (e.g., generalized anxiety disorder, panic attacks, PTSD). If Yes, ask: "How was this  problem treated?" (e.g., medicines, counseling, etc.)     No thoughts of suicide.    7. RISK OF HARM - SUICIDAL IDEATION: "Do you ever have thoughts of hurting or killing yourself?" If Yes, ask:  "Do you have these feelings now?" "Do you have a plan on how you would do this?"     No  8. TREATMENT:  "What has been done so far to treat this anxiety?" (e.g., medicines, relaxation strategies). "What has helped?"     Nothing.   9. TREATMENT - THERAPIST: "Do you have a counselor or therapist? Name?"     No 10. POTENTIAL TRIGGERS: "Do you drink caffeinated beverages (e.g., coffee, colas, teas), and how much daily?" "Do you drink alcohol or use any drugs?" "Have you started any new medicines recently?"     I'm a single Mom and my son just graduated from high school and will be leaving home, and my other son is in the [employer] in [place].    "It will just be me" 10. PATIENT SUPPORT: "Who is with you now?" "Who do you live with?" "Do you have family or friends who you can talk  to?"        She is driving to work and crying as she is talking to me. 11. OTHER SYMPTOMS: "Do you have any other symptoms?" (e.g., feeling depressed, trouble concentrating, trouble sleeping, trouble breathing, palpitations or fast heartbeat, chest pain, sweating, nausea, or diarrhea)       Didn't sleep last night, crying, very bad headache, foggy thinking.  While driving she c/o feeling like she was going to pass out and was feeling nausea.   I instructed her to pull over which she did.   "I think I'm going to call 911".    I let her know that was a good idea.   She apologized for calling in and crying and all.   I let her know I was glad she called in and I agreed she should call 911.   I stayed on the phone with her until she was safely pulled over and ready to call 911.   She thanked me for listening to her and for my support.   12. PREGNANCY: "Is there any chance you are pregnant?" "When was your last menstrual period?"        N/A  Protocols used: Anxiety and Panic Attack-A-AH

## 2020-10-14 NOTE — Telephone Encounter (Signed)
Pt called in while driving to work.   She is crying.    See notes for details.   "I have a terrible headache and  my body aches and I started coughing yesterday".    "I'm also having anxiety because my son just graduated from high school and is leaving home which will leave me alone".      As we were talking she c/o feeling like she was going to pass out.   I instructed her to pull over.   She is calling 911 to take her to the ED since she has anxiety and possible Covid symptoms.  There are no providers in [facility] today.   She was agreeable to going on to the ED via EMS.  See notes for details.

## 2020-10-31 ENCOUNTER — Other Ambulatory Visit: Payer: Self-pay | Admitting: Family Medicine

## 2020-10-31 DIAGNOSIS — R232 Flushing: Secondary | ICD-10-CM

## 2020-11-04 ENCOUNTER — Other Ambulatory Visit: Payer: Self-pay

## 2020-11-04 ENCOUNTER — Encounter: Payer: Self-pay | Admitting: Family Medicine

## 2020-11-04 ENCOUNTER — Ambulatory Visit: Payer: BC Managed Care – PPO | Admitting: Family Medicine

## 2020-11-04 ENCOUNTER — Ambulatory Visit: Payer: Self-pay | Admitting: *Deleted

## 2020-11-04 VITALS — BP 126/74 | HR 97 | Temp 97.7°F | Resp 16 | Ht 62.0 in | Wt 225.6 lb

## 2020-11-04 DIAGNOSIS — F5102 Adjustment insomnia: Secondary | ICD-10-CM

## 2020-11-04 DIAGNOSIS — F4323 Adjustment disorder with mixed anxiety and depressed mood: Secondary | ICD-10-CM

## 2020-11-04 DIAGNOSIS — F410 Panic disorder [episodic paroxysmal anxiety] without agoraphobia: Secondary | ICD-10-CM | POA: Diagnosis not present

## 2020-11-04 MED ORDER — CLONAZEPAM 0.5 MG PO TABS: 0.5000 mg | ORAL_TABLET | Freq: Every evening | ORAL | 1 refills | Status: DC | PRN

## 2020-11-04 MED ORDER — SERTRALINE HCL 50 MG PO TABS: 50.0000 mg | ORAL_TABLET | Freq: Every day | ORAL | 1 refills | Status: DC

## 2020-11-04 MED ORDER — HYDROXYZINE PAMOATE 25 MG PO CAPS: 25.0000 mg | ORAL_CAPSULE | Freq: Three times a day (TID) | ORAL | 3 refills | Status: DC | PRN
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
## 2020-11-04 NOTE — Patient Instructions (Signed)
Adjustment Disorder, Adult Adjustment disorder is a group of symptoms that can develop after a stressful life event, such as the loss of a job or a serious physical illness. The symptoms can affect how you feel, think, and act. They may also interfere withyour relationships. Adjustment disorder increases your risk of suicide and substance abuse. If adjustment disorder is not managed early, it can make medical conditions that you already have worse. If the stressful life event persists, the disorder maycontinue and become a persistent form of adjustment disorder. What are the causes? This condition is caused by difficulty recovering from or coping with astressful life event. What increases the risk? You are more likely to develop this condition if: You have had previous problems coping with life stressors. You are being treated for a long-term (chronic) illness. You are being treated for an illness that cannot be cured (terminal illness). You have a family history of mental illness. What are the signs or symptoms? Symptoms of this condition include: Behavioral symptoms such as: Trouble doing daily tasks. Reckless driving. Poor work Systems analyst. Ignoring bills. Avoiding family and friends. Impulsive actions. Emotional symptoms such as: Sadness, depression, or crying spells. Worrying a lot, or feeling nervous or anxious. Loss of enjoyment. Feelings of loss or hopelessness. Irritability. Thoughts of suicide. Physical symptoms such as: Change in appetite or weight. Complaining of feeling sick without being ill. Feeling dazed or disconnected. Nightmares. Trouble sleeping. Symptoms of this condition start within 3 months of the stressful event. They do not last more than 6 months, unless the stressful circumstances last longer. Normal grieving after the death of a loved one is not a symptom of thiscondition. How is this diagnosed? To diagnose this condition, your health care provider will  ask about what has happened in your life and how it has affected you. He or she may also ask about your medical history and your use of medicines, alcohol, and other substances. Your health care provider may do a physical exam and order lab tests or otherstudies. You may be referred to a mental health specialist. How is this treated? Treatment options for this condition include: Counseling or talk therapy. Talk therapy is usually provided by mental health specialists. This therapy may be individual or may involve family members. Medicines. Certain medicines may help with depression, anxiety, and sleep. Support groups. These offer emotional support, advice, and guidance. They are made up of people who have had similar experiences. Observation and time. This is sometimes called watchful waiting. In this treatment, health care providers monitor your health and behavior without other treatment. Adjustment disorder sometimes gets better on its own with time. Follow these instructions at home: Take over-the-counter and prescription medicines only as told by your health care provider. Keep all follow-up visits. This is important. Contact trusted family and friends for support. Let them know what is going on with you and how they can help. Contact a health care provider if: Your symptoms do not improve in 6 months. Your symptoms get worse. Get help right away if: You have serious thoughts about hurting yourself or someone else. If you ever feel like you may hurt yourself or others, or have thoughts about taking your own life, get help right away. Go to your nearest emergency department or: Call your local emergency services (911 in the [place] Call a suicide crisis helpline, such as the [facility] at 430 816 5151. This is open 24 hours a day in the [place] Text the Crisis Text Line at  374827 (in the [place] Summary Adjustment disorder is a group of symptoms that can develop after a  stressful life event, such as the loss of a job or a serious physical illness. The symptoms can affect how you feel, think, and act. They may interfere with your relationships. Symptoms of this condition start within 3 months of the stressful event. They do not last more than 6 months, unless the stressful circumstances last longer. Treatment may include talk therapy, medicines, participation in a support group, or observation to see if symptoms improve. Contact your health care provider if your symptoms get worse or do not improve in 6 months. If you ever feel like you may hurt yourself or others, or have thoughts about taking your own life, get help right away. This information is not intended to replace advice given to you by your health care provider. Make sure you discuss any questions you have with your healthcare provider. Document Revised: 08/21/2019 Document Reviewed: 08/21/2019 Elsevier Patient Education  [place].

## 2020-11-09 ENCOUNTER — Telehealth: Payer: Self-pay

## 2020-11-09 NOTE — Telephone Encounter (Signed)
Copied from [place] [id number]. Topic: General - Call Back - No Documentation >> Nov 09, 2020  9:20 AM Yvette Rack wrote: Reason for CRM: Pt stated she had a call from the office in regards to some questions about the [facility] paperwork. Pt requests call back. Cb# [phone]

## 2020-11-09 NOTE — Telephone Encounter (Signed)
Pt's call returned
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
## 2020-11-17 ENCOUNTER — Other Ambulatory Visit: Payer: Self-pay

## 2020-11-17 ENCOUNTER — Encounter: Payer: Self-pay | Admitting: Family Medicine

## 2020-11-17 ENCOUNTER — Ambulatory Visit: Payer: BC Managed Care – PPO | Admitting: Family Medicine

## 2020-11-17 ENCOUNTER — Telehealth: Payer: Self-pay

## 2020-11-17 VITALS — BP 124/70 | HR 100 | Temp 98.1°F | Resp 16 | Ht 62.0 in | Wt 224.0 lb

## 2020-11-17 DIAGNOSIS — F5102 Adjustment insomnia: Secondary | ICD-10-CM | POA: Diagnosis not present

## 2020-11-17 DIAGNOSIS — E041 Nontoxic single thyroid nodule: Secondary | ICD-10-CM

## 2020-11-17 DIAGNOSIS — Z1231 Encounter for screening mammogram for malignant neoplasm of breast: Secondary | ICD-10-CM

## 2020-11-17 DIAGNOSIS — Z1159 Encounter for screening for other viral diseases: Secondary | ICD-10-CM

## 2020-11-17 DIAGNOSIS — I10 Essential (primary) hypertension: Secondary | ICD-10-CM

## 2020-11-17 DIAGNOSIS — F341 Dysthymic disorder: Secondary | ICD-10-CM

## 2020-11-17 DIAGNOSIS — R5383 Other fatigue: Secondary | ICD-10-CM

## 2020-11-17 DIAGNOSIS — I771 Stricture of artery: Secondary | ICD-10-CM

## 2020-11-17 DIAGNOSIS — E0590 Thyrotoxicosis, unspecified without thyrotoxic crisis or storm: Secondary | ICD-10-CM

## 2020-11-17 DIAGNOSIS — F410 Panic disorder [episodic paroxysmal anxiety] without agoraphobia: Secondary | ICD-10-CM

## 2020-11-17 DIAGNOSIS — Z131 Encounter for screening for diabetes mellitus: Secondary | ICD-10-CM

## 2020-11-17 MED ORDER — HYDROCHLOROTHIAZIDE 25 MG PO TABS: 25.0000 mg | ORAL_TABLET | Freq: Every day | ORAL | 0 refills | Status: DC

## 2020-11-17 NOTE — Telephone Encounter (Signed)
Patients states you were taking her out of work due to her depression?  When is she to return?

## 2020-11-17 NOTE — Telephone Encounter (Signed)
Nevermind, sowles will do

## 2020-11-18 LAB — COMPLETE METABOLIC PANEL WITH GFR
AG Ratio: 1.3 (calc) (ref 1.0–2.5)
ALT: 11 U/L (ref 6–29)
AST: 11 U/L (ref 10–35)
Albumin: 4 g/dL (ref 3.6–5.1)
Alkaline phosphatase (APISO): 70 U/L (ref 37–153)
BUN: 11 mg/dL (ref 7–25)
CO2: 30 mmol/L (ref 20–32)
Calcium: 9.8 mg/dL (ref 8.6–10.4)
Chloride: 101 mmol/L (ref 98–110)
Creat: 0.69 mg/dL (ref 0.50–1.03)
Globulin: 3.1 g/dL (calc) (ref 1.9–3.7)
Glucose, Bld: 96 mg/dL (ref 65–99)
Potassium: 4.4 mmol/L (ref 3.5–5.3)
Sodium: 141 mmol/L (ref 135–146)
Total Bilirubin: 0.5 mg/dL (ref 0.2–1.2)
Total Protein: 7.1 g/dL (ref 6.1–8.1)
eGFR: 106 mL/min/{1.73_m2} (ref >=60)

## 2020-11-18 LAB — CBC WITH DIFFERENTIAL/PLATELET
Absolute Monocytes: 343 cells/uL (ref 200–950)
Basophils Absolute: 29 cells/uL (ref 0–200)
Basophils Relative: 0.4 %
Eosinophils Absolute: 88 cells/uL (ref 15–500)
Eosinophils Relative: 1.2 %
HCT: 38.5 % (ref 35.0–45.0)
Hemoglobin: 12.4 g/dL (ref 11.7–15.5)
Lymphs Abs: 1978 cells/uL (ref 850–3900)
MCH: 27.7 pg (ref 27.0–33.0)
MCHC: 32.2 g/dL (ref 32.0–36.0)
MCV: 85.9 fL (ref 80.0–100.0)
MPV: 10 fL (ref 7.5–12.5)
Monocytes Relative: 4.7 %
Neutro Abs: 4862 cells/uL (ref 1500–7800)
Neutrophils Relative %: 66.6 %
Platelets: 346 10*3/uL (ref 140–400)
RBC: 4.48 10*6/uL (ref 3.80–5.10)
RDW: 14.4 % (ref 11.0–15.0)
Total Lymphocyte: 27.1 %
WBC: 7.3 10*3/uL (ref 3.8–10.8)

## 2020-11-18 LAB — ADVANCED WRITTEN NOTIFICATION (AWN) TEST REFUSAL: AWN TEST REFUSED: 92717306

## 2020-11-18 LAB — HEMOGLOBIN A1C
Hgb A1c MFr Bld: 5.5 % of total Hgb (ref <5.7)
Mean Plasma Glucose: 111 mg/dL
eAG (mmol/L): 6.2 mmol/L

## 2020-11-18 LAB — LIPID PANEL
Cholesterol: 123 mg/dL (ref <200)
HDL: 58 mg/dL (ref >=50)
LDL Cholesterol (Calc): 49 mg/dL (calc)
Non-HDL Cholesterol (Calc): 65 mg/dL (calc) (ref <130)
Total CHOL/HDL Ratio: 2.1 (calc) (ref <5.0)
Triglycerides: 77 mg/dL (ref <150)

## 2020-11-18 LAB — TSH: TSH: 0.31 mIU/L — ABNORMAL LOW

## 2020-11-18 LAB — HEPATITIS C ANTIBODY
Hepatitis C Ab: NONREACTIVE
SIGNAL TO CUT-OFF: 0.01 (ref <1.00)

## 2020-11-22 ENCOUNTER — Telehealth: Payer: Self-pay

## 2020-11-22 NOTE — Telephone Encounter (Signed)
Notes faxed.

## 2020-11-22 NOTE — Telephone Encounter (Signed)
Copied from [place] [phone]. Topic: General - Other >> Nov 22, 2020  8:44 AM Tessa Lerner A wrote: Reason for CRM: Arby Barrette with [employer] has called requesting office notes from 7/15 7/28   Arby Barrette would like the office notes faxed to [phone]   The reference code for the patient is [id number]  Please contact further if needed  Arby Barrette shares that this request was also faxed to the office yestreday 11/21/20 and is needed today by the end of the day 11/22/20

## 2020-11-23 ENCOUNTER — Ambulatory Visit
Admission: RE | Admit: 2020-11-23 | Discharge: 2020-11-23 | Disposition: A | Discharge status: Home / Self Care | Payer: BC Managed Care – PPO | Source: Ambulatory Visit | Attending: Family Medicine | Admitting: Family Medicine

## 2020-11-23 DIAGNOSIS — Z1231 Encounter for screening mammogram for malignant neoplasm of breast: Secondary | ICD-10-CM | POA: Diagnosis present

## 2020-11-26 ENCOUNTER — Other Ambulatory Visit: Payer: Self-pay | Admitting: Family Medicine

## 2020-11-26 DIAGNOSIS — F410 Panic disorder [episodic paroxysmal anxiety] without agoraphobia: Secondary | ICD-10-CM

## 2020-11-26 DIAGNOSIS — F4323 Adjustment disorder with mixed anxiety and depressed mood: Secondary | ICD-10-CM

## 2020-11-26 DIAGNOSIS — F5102 Adjustment insomnia: Secondary | ICD-10-CM

## 2020-11-28 ENCOUNTER — Ambulatory Visit: Payer: Self-pay | Admitting: *Deleted

## 2020-11-28 NOTE — Telephone Encounter (Signed)
'  Brandy CM with [employer]. States she spoke with pt today and has concerns. States pt "Seems numb." States she did not know what medications she should take PRN, which are scheduled. Pt reported "Brain fog and just uninterested in things." Denies any drug, alcohol use. CM state she is well acquainted with pt  and is concerned, especially how she is managing meds. NT attempted to reach pt, rapid busy signal at only number provided. Attempted to reach x 2. Brandy's number if needed   [phone]  Ext [phone]     Reason for Disposition  Nursing judgment or information in reference  Answer Assessment - Initial Assessment Questions 1. REASON FOR CALL: "What is your main concern right now?"     CM calling to express concern for pt 2. ONSET: "When did the *No Answer* start?"     *No Answer* 3. SEVERITY: "How bad is the *No Answer*?"     *No Answer* 4. FEVER: "Do you have a fever?"     *No Answer* 5. OTHER SYMPTOMS: "Do you have any other new symptoms?"     *No Answer* 6. TREATMENTS AND RESPONSE: "What have you done so far to try to make this better? What medicines have you used?"     *No Answer* 7. PREGNANCY: "Is there any chance you are pregnant?" "When was your last menstrual period?"     *No Answer*  Protocols used: No Guideline Available-A-AH

## 2020-12-01 ENCOUNTER — Ambulatory Visit
Admission: RE | Admit: 2020-12-01 | Discharge: 2020-12-01 | Disposition: A | Discharge status: Home / Self Care | Payer: BC Managed Care – PPO | Source: Ambulatory Visit | Attending: Family Medicine | Admitting: Family Medicine

## 2020-12-01 ENCOUNTER — Other Ambulatory Visit: Payer: Self-pay

## 2020-12-01 DIAGNOSIS — E0590 Thyrotoxicosis, unspecified without thyrotoxic crisis or storm: Secondary | ICD-10-CM

## 2020-12-01 DIAGNOSIS — E041 Nontoxic single thyroid nodule: Secondary | ICD-10-CM

## 2020-12-01 DIAGNOSIS — E042 Nontoxic multinodular goiter: Secondary | ICD-10-CM | POA: Diagnosis not present

## 2020-12-02 ENCOUNTER — Other Ambulatory Visit: Payer: Self-pay | Admitting: Family Medicine

## 2020-12-02 DIAGNOSIS — E041 Nontoxic single thyroid nodule: Secondary | ICD-10-CM

## 2020-12-05 NOTE — Telephone Encounter (Signed)
Message from Atwood Callas sent at 12/05/2020  3:12 PM EDT  Theadora Rama with [employer] called asking if a nurse  or someone could call her back and give her an update on the patients medications and if anyone reached out to talk to the patient.   CB#  [phone]  x [phone]   ----- Message from Yvette Rack sent at 11/28/2020  4:38 PM EDT -----  Theadora Rama with [employer] requests to speak with a nurse as she recently spoke with patient and is concerned about patient's mental health. Cb# [phone] Ext. [phone]     Call History   Type Contact Phone/Fax User  11/28/2020 05:27 PM EDT Phone [facility]) [place] with [employer] [phone] Elliot Cousin, RN  11/28/2020 04:36 PM EDT Phone (Incoming) [place] with [employer] [phone] Yvette Rack  ExtMarland Kitchen [phone]   Encounter Report  Patient Encounter Report

## 2020-12-05 NOTE — Telephone Encounter (Signed)
Called to check on pt. Reminded her of her appt on 12/15/20 and asked her if she needs anything before then.Pt stated "no". I advised pt to call the office if she did.

## 2020-12-05 NOTE — Telephone Encounter (Signed)
Spoke with [place] at [employer]. She is asking for follow up from 11/28/20 encounter. No notations from PCP. Concerned about pt. States "pt. Is very flat and I think she is over medicated." Attempted to reach pt. Get a rapid busy signal. Please advise.

## 2020-12-09 DIAGNOSIS — E0590 Thyrotoxicosis, unspecified without thyrotoxic crisis or storm: Secondary | ICD-10-CM | POA: Diagnosis not present

## 2020-12-09 DIAGNOSIS — E042 Nontoxic multinodular goiter: Secondary | ICD-10-CM | POA: Diagnosis not present

## 2020-12-14 NOTE — Progress Notes (Signed)
Name: Wendy Kent   MRN: [id number]    DOB: 06/16/1970   Date:12/15/2020       Progress Note  Subjective  Chief Complaint  Follow Up- Depression  HPI  HTN: BP elevated today, patient states having palpation and feeling hot and closed in. Heart rate was high when she first walked in. When I saw her she was fanning herself. , but before she left she seemed more calm and bp 140/88  Dysthymia and Anxiety: symptoms started a few months ago when her youngest son was about to graduate from HS and move to [place]. She started to get scared that she would be all alone.  She saw me in July and we discussed referral to psychiatrist and to continue seeing therapist ( usually virtually) Her son Trey ( 3 rd child ) and Justin (youngest ) are at home now. She also has two older sons that live in [place]. Justin moved to [place] to live with Trey ( that is in the military) and was planning on joining the military himself but moved back home because she is not doing well. She seems more anxious today. She states she feels guilty that she is holding them back . She also worries about being alone and them forgetting about her . She cried during the visit, was hyperventilating but calmed down during the visit.   Patient Active Problem List   Diagnosis Date Noted   Thyroid nodule 11/17/2020   Tortuous aorta (HCC) 05/19/2020   Fatty liver 02/26/2020   Lymphedema 01/21/2019   Varicose veins of leg with pain, bilateral 10/14/2018   Lactose intolerance 09/26/2015   IBS (irritable bowel syndrome) 09/26/2015   Morbid obesity (HCC) 09/28/2014   Hyperthyroidism, subclinical 09/28/2014   Allergic rhinitis, seasonal 09/28/2014   Hypertension goal BP (blood pressure) < 140/90 12/29/2012    Past Surgical History:  Procedure Laterality Date   ABDOMINAL HYSTERECTOMY     patient still has ovaries   BREAST REDUCTION SURGERY Bilateral 08/05/2017   Procedure: BREAST REDUCTION WITH LIPOSUCTION;  Surgeon: Truesdale, Gerald, MD;   Location: [facility];  Service: Plastics;  Laterality: Bilateral;   CHOLECYSTECTOMY     COLONOSCOPY WITH PROPOFOL N/A 03/26/2018   Procedure: COLONOSCOPY WITH PROPOFOL;  Surgeon: Anna, Kiran, MD;  Location: [facility];  Service: Gastroenterology;  Laterality: N/A;   ESOPHAGOGASTRODUODENOSCOPY (EGD) WITH PROPOFOL N/A 03/26/2018   Procedure: ESOPHAGOGASTRODUODENOSCOPY (EGD) WITH PROPOFOL;  Surgeon: Anna, Kiran, MD;  Location: [facility];  Service: Gastroenterology;  Laterality: N/A;   GIVENS CAPSULE STUDY N/A 04/14/2018   Procedure: GIVENS CAPSULE STUDY;  Surgeon: Anna, Kiran, MD;  Location: [facility];  Service: Gastroenterology;  Laterality: N/A;   REDUCTION MAMMAPLASTY     2019   SHOULDER ARTHROSCOPY Right    TUBAL LIGATION      Family History  Problem Relation Age of Onset   Hyperthyroidism Sister    Alcohol abuse Brother     Social History   Tobacco Use   Smoking status: Never   Smokeless tobacco: Never  Substance Use Topics   Alcohol use: Yes    Alcohol/week: 0.0 standard drinks    Comment: socially     Current Outpatient Medications:    atenolol (TENORMIN) 25 MG tablet, Take 1 tablet (25 mg total) by mouth daily., Disp: 90 tablet, Rfl: 0   clonazePAM (KLONOPIN) 0.5 MG tablet, Take 1 tablet (0.5 mg total) by mouth at bedtime as needed for anxiety (insomnia, panic attacks)., Disp: 20 tablet, Rfl: 1     cloNIDine (CATAPRES) 0.1 MG tablet, TAKE 1 TABLET BY MOUTH EVERYDAY AT BEDTIME, Disp: 90 tablet, Rfl: 0   fluticasone (FLONASE) 50 MCG/ACT nasal spray, Place 2 sprays into both nostrils daily as needed., Disp: 48 mL, Rfl: 3   hydrochlorothiazide (HYDRODIURIL) 25 MG tablet, Take 1 tablet (25 mg total) by mouth daily., Disp: 90 tablet, Rfl: 0   hydrOXYzine (VISTARIL) 25 MG capsule, Take 1 capsule (25 mg total) by mouth every 8 (eight) hours as needed for anxiety (can take 25-50 mg at bedtime prn for sleep/anxiety)., Disp: 90 capsule, Rfl: 3   sertraline  (ZOLOFT) 50 MG tablet, Take 1 tablet (50 mg total) by mouth at bedtime., Disp: 30 tablet, Rfl: 1  Allergies  Allergen Reactions   Percocet [Oxycodone-Acetaminophen] Anaphylaxis    I personally reviewed active problem list, medication list, allergies, family history, social history, health maintenance with the patient/caregiver today.   ROS  Ten systems reviewed and is negative except as mentioned in HPI   Objective  Vitals:   12/15/20 1008 12/15/20 1024 12/15/20 1045  BP: (!) 156/110 (!) 150/108 140/88  Pulse: (!) 117 91   Resp: 16    Temp: 98.3 F (36.8 C)    SpO2: 97%    Weight: 223 lb (101.2 kg)    Height: 5' 2" (1.575 m)      Body mass index is 40.79 kg/m.  Physical Exam  Constitutional: Patient appears well-developed and well-nourished. Obese  No distress.  HEENT: head atraumatic, normocephalic, pupils equal and reactive to light, neck supple Cardiovascular: Normal rate, regular rhythm and normal heart sounds.  No murmur heard. No BLE edema. Pulmonary/Chest: Effort normal and breath sounds normal. No respiratory distress. Abdominal: Soft.  There is no tenderness. Psychiatric: initially very anxious, fanning herself, hyperventilating, but before the end of the visit she seemed more calm.   Recent Results (from the past 2160 hour(s))  TSH     Status: Abnormal   Collection Time: 11/17/20  8:54 AM  Result Value Ref Range   TSH 0.31 (L) mIU/L    Comment:           Reference Range .           > or = 20 Years  0.40-4.50 .                Pregnancy Ranges           First trimester    0.26-2.66           Second trimester   0.55-2.73           Third trimester    0.43-2.91   Lipid panel     Status: None   Collection Time: 11/17/20  8:54 AM  Result Value Ref Range   Cholesterol 123 <200 mg/dL   HDL 58 > OR = 50 mg/dL   Triglycerides 77 <150 mg/dL   LDL Cholesterol (Calc) 49 mg/dL (calc)    Comment: Reference range: <100 . Desirable range <100 mg/dL for primary  prevention;   <70 mg/dL for patients with CHD or diabetic patients  with > or = 2 CHD risk factors. . LDL-C is now calculated using the Martin-Hopkins  calculation, which is a validated novel method providing  better accuracy than the Friedewald equation in the  estimation of LDL-C.  Martin SS et al. JAMA. 2013;310(19): 2061-2068  (http://education.[facility].com/faq/FAQ164)    Total CHOL/HDL Ratio 2.1 <5.0 (calc)   Non-HDL Cholesterol (Calc) 65 <130 mg/dL (calc)    Comment:   For patients with diabetes plus 1 major ASCVD risk  factor, treating to a non-HDL-C goal of <100 mg/dL  (LDL-C of <70 mg/dL) is considered a therapeutic  option.   CBC with Differential/Platelet     Status: None   Collection Time: 11/17/20  8:54 AM  Result Value Ref Range   WBC 7.3 3.8 - 10.8 Thousand/uL   RBC 4.48 3.80 - 5.10 Million/uL   Hemoglobin 12.4 11.7 - 15.5 g/dL   HCT 38.5 35.0 - 45.0 %   MCV 85.9 80.0 - 100.0 fL   MCH 27.7 27.0 - 33.0 pg   MCHC 32.2 32.0 - 36.0 g/dL   RDW 14.4 11.0 - 15.0 %   Platelets 346 140 - 400 Thousand/uL   MPV 10.0 7.5 - 12.5 fL   Neutro Abs 4,862 1,500 - 7,800 cells/uL   Lymphs Abs 1,978 850 - 3,900 cells/uL   Absolute Monocytes 343 200 - 950 cells/uL   Eosinophils Absolute 88 15 - 500 cells/uL   Basophils Absolute 29 0 - 200 cells/uL   Neutrophils Relative % 66.6 %   Total Lymphocyte 27.1 %   Monocytes Relative 4.7 %   Eosinophils Relative 1.2 %   Basophils Relative 0.4 %  COMPLETE METABOLIC PANEL WITH GFR     Status: None   Collection Time: 11/17/20  8:54 AM  Result Value Ref Range   Glucose, Bld 96 65 - 99 mg/dL    Comment: .            Fasting reference interval .    BUN 11 7 - 25 mg/dL   Creat 0.69 0.50 - 1.03 mg/dL   eGFR 106 > OR = 60 mL/min/1.73m2    Comment: The eGFR is based on the CKD-EPI 2021 equation. To calculate  the new eGFR from a previous Creatinine or Cystatin C result, go to  https://www.kidney.org/professionals/ kdoqi/gfr%5Fcalculator    BUN/Creatinine Ratio NOT APPLICABLE 6 - 22 (calc)   Sodium 141 135 - 146 mmol/L   Potassium 4.4 3.5 - 5.3 mmol/L   Chloride 101 98 - 110 mmol/L   CO2 30 20 - 32 mmol/L   Calcium 9.8 8.6 - 10.4 mg/dL   Total Protein 7.1 6.1 - 8.1 g/dL   Albumin 4.0 3.6 - 5.1 g/dL   Globulin 3.1 1.9 - 3.7 g/dL (calc)   AG Ratio 1.3 1.0 - 2.5 (calc)   Total Bilirubin 0.5 0.2 - 1.2 mg/dL   Alkaline phosphatase (APISO) 70 37 - 153 U/L   AST 11 10 - 35 U/L   ALT 11 6 - 29 U/L  Hemoglobin A1c     Status: None   Collection Time: 11/17/20  8:54 AM  Result Value Ref Range   Hgb A1c MFr Bld 5.5 <5.7 % of total Hgb    Comment: For the purpose of screening for the presence of diabetes: . <5.7%       Consistent with the absence of diabetes 5.7-6.4%    Consistent with increased risk for diabetes             (prediabetes) > or =6.5%  Consistent with diabetes . This assay result is consistent with a decreased risk of diabetes. . Currently, no consensus exists regarding use of hemoglobin A1c for diagnosis of diabetes in children. . According to American Diabetes Association (ADA) guidelines, hemoglobin A1c <7.0% represents optimal control in non-pregnant diabetic patients. Different metrics may apply to specific patient populations.  Standards of Medical Care in Diabetes(ADA). .      Mean Plasma Glucose 111 mg/dL   eAG (mmol/L) 6.2 mmol/L  Hepatitis C antibody     Status: None   Collection Time: 11/17/20  8:54 AM  Result Value Ref Range   Hepatitis C Ab NON-REACTIVE NON-REACTIVE   SIGNAL TO CUT-OFF 0.01 <1.00    Comment: . HCV antibody was non-reactive. There is no laboratory  evidence of HCV infection. . In most cases, no further action is required. However, if recent HCV exposure is suspected, a test for HCV RNA (test code 35645) is suggested. . For additional information please refer  to http://education.[facility].com/faq/FAQ22v1 (This link is being provided for informational/ educational purposes only.) .   Advanced Written Notification (AWN) Test Refusal     Status: None   Collection Time: 11/17/20  8:54 AM  Result Value Ref Range   RAM1      Comment: . Be advised that your patient has indicated on the advance written notice their decision not to receive the following laboratory tests. As a result, the tests will not be performed.    AWN TEST REFUSED [phone]     PHQ2/9: Depression screen PHQ 2/9 12/15/2020 11/17/2020 11/04/2020 05/25/2020 03/29/2020  Decreased Interest 3 2 3 0 0  Down, Depressed, Hopeless 3 2 3 0 0  PHQ - 2 Score 6 4 6 0 0  Altered sleeping 3 2 3 - -  Tired, decreased energy 3 3 3 - -  Change in appetite 3 1 3 - -  Feeling bad or failure about yourself  3 2 3 - -  Trouble concentrating 3 0 3 - -  Moving slowly or fidgety/restless 0 0 0 - -  Suicidal thoughts 0 0 0 - -  PHQ-9 Score 21 12 21 - -  Difficult doing work/chores Extremely dIfficult - Extremely dIfficult - -  Some recent data might be hidden    phq 9 is positive   Fall Risk: Fall Risk  12/15/2020 11/17/2020 11/04/2020 05/25/2020 03/29/2020  Falls in the past year? 0 0 0 0 0  Comment - - - - -  Number falls in past yr: 0 0 0 0 0  Injury with Fall? 0 0 0 0 0  Risk for fall due to : No Fall Risks - - - -  Follow up Falls prevention discussed - - - Falls evaluation completed     Functional Status Survey: Is the patient deaf or have difficulty hearing?: No Does the patient have difficulty seeing, even when wearing glasses/contacts?: No Does the patient have difficulty concentrating, remembering, or making decisions?: No Does the patient have difficulty walking or climbing stairs?: No Does the patient have difficulty dressing or bathing?: No Does the patient have difficulty doing errands alone such as visiting a doctor's office or shopping?: No    Assessment &  Plan   1. Panic attack  - atenolol (TENORMIN) 25 MG tablet; Take 1 tablet (25 mg total) by mouth daily.  Dispense: 90 tablet; Refill: 0  Discussed in person therapy, mindfulness exercises  2. Dysthymia  - atenolol (TENORMIN) 25 MG tablet; Take 1 tablet (25 mg total) by mouth daily.  Dispense: 90 tablet; Refill: 0  3. Adjustment disorder with mixed anxiety and depressed mood  Appointment with psychiatrist not until October   4. Hyperthyroidism, subclinical  - atenolol (TENORMIN) 25 MG tablet; Take 1 tablet (25 mg total) by mouth daily.  Dispense: 90 tablet; Refill: 0  

## 2020-12-15 ENCOUNTER — Encounter: Payer: Self-pay | Admitting: Family Medicine

## 2020-12-15 ENCOUNTER — Other Ambulatory Visit: Payer: Self-pay

## 2020-12-15 ENCOUNTER — Ambulatory Visit: Payer: BC Managed Care – PPO | Admitting: Family Medicine

## 2020-12-15 VITALS — BP 140/88 | HR 91 | Temp 98.3°F | Resp 16 | Ht 62.0 in | Wt 223.0 lb

## 2020-12-15 DIAGNOSIS — F4323 Adjustment disorder with mixed anxiety and depressed mood: Secondary | ICD-10-CM | POA: Diagnosis not present

## 2020-12-15 DIAGNOSIS — F341 Dysthymic disorder: Secondary | ICD-10-CM | POA: Diagnosis not present

## 2020-12-15 DIAGNOSIS — F410 Panic disorder [episodic paroxysmal anxiety] without agoraphobia: Secondary | ICD-10-CM | POA: Diagnosis not present

## 2020-12-15 DIAGNOSIS — E0590 Thyrotoxicosis, unspecified without thyrotoxic crisis or storm: Secondary | ICD-10-CM | POA: Diagnosis not present

## 2020-12-15 MED ORDER — ATENOLOL 25 MG PO TABS
25.0000 mg | ORAL_TABLET | Freq: Every day | ORAL | 0 refills | Status: DC
Start: 2020-12-15 — End: 2021-01-16

## 2020-12-15 NOTE — Patient Instructions (Signed)
[facility] therapist

## 2020-12-20 DIAGNOSIS — F5105 Insomnia due to other mental disorder: Secondary | ICD-10-CM | POA: Diagnosis not present

## 2020-12-20 DIAGNOSIS — F321 Major depressive disorder, single episode, moderate: Secondary | ICD-10-CM | POA: Diagnosis not present

## 2020-12-20 DIAGNOSIS — F410 Panic disorder [episodic paroxysmal anxiety] without agoraphobia: Secondary | ICD-10-CM | POA: Diagnosis not present

## 2020-12-21 ENCOUNTER — Telehealth: Payer: Self-pay

## 2020-12-21 ENCOUNTER — Other Ambulatory Visit: Payer: Self-pay

## 2020-12-21 DIAGNOSIS — F4323 Adjustment disorder with mixed anxiety and depressed mood: Secondary | ICD-10-CM

## 2020-12-21 DIAGNOSIS — F5102 Adjustment insomnia: Secondary | ICD-10-CM

## 2020-12-21 DIAGNOSIS — F410 Panic disorder [episodic paroxysmal anxiety] without agoraphobia: Secondary | ICD-10-CM

## 2020-12-21 NOTE — Telephone Encounter (Signed)
Copied from [place]. Topic: General - Other >> Dec 21, 2020 10:14 AM Alanda Slim E wrote: Reason for CRM: Pt had her therapist appt yesterday and records should have been sent to Dr. Ancil Boozer / Pt wants to make sure they are sent to [place] for her work short term disability / Pt would like to speak with Dr. Ancil Boozer nurse

## 2020-12-21 NOTE — Telephone Encounter (Signed)
Spoke with patient and confirmed information was sent to [facility] for STD claim. Patient was pleasant and appreciative with no further questions/concerns at this time.

## 2020-12-24 ENCOUNTER — Other Ambulatory Visit: Payer: Self-pay | Admitting: Family Medicine

## 2020-12-24 DIAGNOSIS — F410 Panic disorder [episodic paroxysmal anxiety] without agoraphobia: Secondary | ICD-10-CM

## 2020-12-24 DIAGNOSIS — F4323 Adjustment disorder with mixed anxiety and depressed mood: Secondary | ICD-10-CM

## 2021-01-04 NOTE — Telephone Encounter (Signed)
Pt called in stating she spoke with [employer] and he stated to her that they terminated her short term disability, due to the providers name not being on the paper, and pt requested if PCP could send back that information, please advise.

## 2021-01-04 NOTE — Telephone Encounter (Signed)
Spoke with patient, she needed notes faxed from Dr. Isla Pence from her but she did not have her number. Number as well as fax number information was given to patient. She will call back if there is anything further she needs from our office.

## 2021-01-06 DIAGNOSIS — F321 Major depressive disorder, single episode, moderate: Secondary | ICD-10-CM | POA: Diagnosis not present

## 2021-01-06 DIAGNOSIS — F410 Panic disorder [episodic paroxysmal anxiety] without agoraphobia: Secondary | ICD-10-CM | POA: Diagnosis not present

## 2021-01-06 DIAGNOSIS — F5105 Insomnia due to other mental disorder: Secondary | ICD-10-CM | POA: Diagnosis not present

## 2021-01-11 ENCOUNTER — Ambulatory Visit: Payer: BC Managed Care – PPO | Admitting: Family Medicine

## 2021-01-16 ENCOUNTER — Ambulatory Visit: Payer: BC Managed Care – PPO | Admitting: Family Medicine

## 2021-01-16 ENCOUNTER — Encounter: Payer: Self-pay | Admitting: Family Medicine

## 2021-01-16 ENCOUNTER — Other Ambulatory Visit: Payer: Self-pay

## 2021-01-16 VITALS — BP 132/76 | HR 96 | Temp 98.2°F | Resp 16 | Ht 62.0 in | Wt 226.0 lb

## 2021-01-16 DIAGNOSIS — Z23 Encounter for immunization: Secondary | ICD-10-CM

## 2021-01-16 DIAGNOSIS — R232 Flushing: Secondary | ICD-10-CM | POA: Diagnosis not present

## 2021-01-16 DIAGNOSIS — I10 Essential (primary) hypertension: Secondary | ICD-10-CM

## 2021-01-16 DIAGNOSIS — F4323 Adjustment disorder with mixed anxiety and depressed mood: Secondary | ICD-10-CM

## 2021-01-16 DIAGNOSIS — E0590 Thyrotoxicosis, unspecified without thyrotoxic crisis or storm: Secondary | ICD-10-CM | POA: Diagnosis not present

## 2021-01-16 DIAGNOSIS — E042 Nontoxic multinodular goiter: Secondary | ICD-10-CM

## 2021-01-16 DIAGNOSIS — I771 Stricture of artery: Secondary | ICD-10-CM

## 2021-01-16 DIAGNOSIS — F5102 Adjustment insomnia: Secondary | ICD-10-CM

## 2021-01-16 MED ORDER — ATORVASTATIN CALCIUM 10 MG PO TABS: 10.0000 mg | ORAL_TABLET | Freq: Every day | ORAL | 3 refills | Status: DC

## 2021-01-16 MED ORDER — HYDROCHLOROTHIAZIDE 25 MG PO TABS
25.0000 mg | ORAL_TABLET | Freq: Every day | ORAL | 1 refills | Status: DC
Start: 2021-01-16 — End: 2021-05-16

## 2021-01-16 MED ORDER — CLONIDINE HCL 0.1 MG PO TABS
0.1000 mg | ORAL_TABLET | Freq: Every day | ORAL | 1 refills | Status: DC
Start: 2021-01-16 — End: 2021-05-16

## 2021-01-16 NOTE — Progress Notes (Signed)
Name: Wendy Kent   MRN: [id number]    DOB: 04-24-1970   Date:01/16/2021       Progress Note  Subjective  Chief Complaint  Follow Up  HPI  HTN: BP is at goal today, she is only taking clonidine 0.1 mg qhs and HCTZ in am, no side effects. No chest pain, palpitation or dizziness.   Adjustment disorder with depressive  symptoms started a few months ago when her youngest son was about to graduate from [employer] and move to [place]. She started to get scared that she would be all alone.  She saw me in July and we discussed referral to psychiatrist and to continue seeing therapist ( usually virtually) Her son Lytle Michaels ( 3 rd child ) and Larkin Ina (youngest ) are at home now. She also has two older sons that live in [place]. Larkin Ina moved to [place] to live with Lytle Michaels ( that is in [employer]) and was planning on joining the [employer] himself but moved back home because she is not doing well. Since her last visit with me she was seen by Dr. Nicolasa Ducking, has tried Hydroxizine, Celexa and trazodone, however had a heart to heart conversation with her sister last week and went to church that evening and since than feeling like her normal self. She wants to support her kids and let them grow. Denies any recent panic attacks, stopped taking medications but will follow up with Dr. Nicolasa Ducking. She is ready to go back to work.   Tortuous aorta : LDL at goal but willing to start statin therapy, discussed possible side effects  Thyromegaly and sub-clinical hyperthyroidism: seen by Dr. Honor Junes and had [place] and given reassurance, going back yearly. She did not like Atenolol made her feel dizzy and nauseated so she stopped medication   Morbid obesity: BMI above 40, she has gained 6 lbs since last year, she states now that she is feeling back to her normal self, she will resume regular physical activity    Patient Active Problem List   Diagnosis Date Noted   Thyroid nodule 11/17/2020   Tortuous aorta ([place]) 05/19/2020   Fatty liver 02/26/2020    Lymphedema 01/21/2019   Varicose veins of leg with pain, bilateral 10/14/2018   Lactose intolerance 09/26/2015   IBS (irritable bowel syndrome) 09/26/2015   Morbid obesity ([place]) 09/28/2014   Hyperthyroidism, subclinical 09/28/2014   Allergic rhinitis, seasonal 09/28/2014   Hypertension goal BP (blood pressure) < 140/90 12/29/2012    Past Surgical History:  Procedure Laterality Date   ABDOMINAL HYSTERECTOMY     patient still has ovaries   BREAST REDUCTION SURGERY Bilateral 08/05/2017   Procedure: BREAST REDUCTION WITH LIPOSUCTION;  Surgeon: Cristine Polio, MD;  Location: [facility];  Service: Plastics;  Laterality: Bilateral;   CHOLECYSTECTOMY     COLONOSCOPY WITH PROPOFOL N/A 03/26/2018   Procedure: COLONOSCOPY WITH PROPOFOL;  Surgeon: Jonathon Bellows, MD;  Location: [facility] ENDOSCOPY;  Service: Gastroenterology;  Laterality: N/A;   ESOPHAGOGASTRODUODENOSCOPY (EGD) WITH PROPOFOL N/A 03/26/2018   Procedure: ESOPHAGOGASTRODUODENOSCOPY (EGD) WITH PROPOFOL;  Surgeon: Jonathon Bellows, MD;  Location: [facility] ENDOSCOPY;  Service: Gastroenterology;  Laterality: N/A;   GIVENS CAPSULE STUDY N/A 04/14/2018   Procedure: GIVENS CAPSULE STUDY;  Surgeon: Jonathon Bellows, MD;  Location: [facility] ENDOSCOPY;  Service: Gastroenterology;  Laterality: N/A;   REDUCTION MAMMAPLASTY     2019   SHOULDER ARTHROSCOPY Right    TUBAL LIGATION      Family History  Problem Relation Age of Onset   Hyperthyroidism Sister  Alcohol abuse Brother     Social History   Tobacco Use   Smoking status: Never   Smokeless tobacco: Never  Substance Use Topics   Alcohol use: Yes    Alcohol/week: 0.0 standard drinks    Comment: socially     Current Outpatient Medications:    atenolol (TENORMIN) 25 MG tablet, Take 1 tablet (25 mg total) by mouth daily., Disp: 90 tablet, Rfl: 0   citalopram (CELEXA) 20 MG tablet, Take 20 mg by mouth daily., Disp: , Rfl:    cloNIDine (CATAPRES) 0.1 MG tablet, TAKE 1 TABLET BY MOUTH  EVERYDAY AT BEDTIME, Disp: 90 tablet, Rfl: 0   fluticasone (FLONASE) 50 MCG/ACT nasal spray, Place 2 sprays into both nostrils daily as needed., Disp: 48 mL, Rfl: 3   traZODone (DESYREL) 50 MG tablet, Take 100 mg by mouth at bedtime as needed., Disp: , Rfl:    hydrochlorothiazide (HYDRODIURIL) 25 MG tablet, Take 1 tablet (25 mg total) by mouth daily., Disp: 90 tablet, Rfl: 0  Allergies  Allergen Reactions   Percocet [Oxycodone-Acetaminophen] Anaphylaxis    I personally reviewed active problem list, medication list, allergies, family history, social history, health maintenance with the patient/caregiver today.   ROS  Constitutional: Negative for fever or weight change.  Respiratory: Negative for cough and shortness of breath.   Cardiovascular: Negative for chest pain or palpitations.  Gastrointestinal: Negative for abdominal pain, no bowel changes.  Musculoskeletal: Negative for gait problem or joint swelling.  Skin: Negative for rash.  Neurological: Negative for dizziness or headache.  No other specific complaints in a complete review of systems (except as listed in HPI above).   Objective  Vitals:   01/16/21 0958  BP: 132/76  Pulse: 96  Resp: 16  Temp: 98.2 F (36.8 C)  SpO2: 99%  Weight: 226 lb (102.5 kg)  Height: 5' 2"  (1.575 m)    Body mass index is 41.34 kg/m.  Physical Exam  Constitutional: Patient appears well-developed and well-nourished. Obese  No distress.  HEENT: head atraumatic, normocephalic, pupils equal and reactive to light, thyromegaly  Cardiovascular: Normal rate, regular rhythm and normal heart sounds.  No murmur heard. No BLE edema. Pulmonary/Chest: Effort normal and breath sounds normal. No respiratory distress. Abdominal: Soft.  There is no tenderness. Psychiatric: Patient has a normal mood and affect. behavior is normal. Judgment and thought content normal.   Recent Results (from the past 2160 hour(s))  TSH     Status: Abnormal   Collection  Time: 11/17/20  8:54 AM  Result Value Ref Range   TSH 0.31 (L) mIU/L    Comment:           Reference Range .           > or = 20 Years  0.40-4.50 .                Pregnancy Ranges           First trimester    0.26-2.66           Second trimester   0.55-2.73           Third trimester    0.43-2.91   Lipid panel     Status: None   Collection Time: 11/17/20  8:54 AM  Result Value Ref Range   Cholesterol 123 <200 mg/dL   HDL 58 > OR = 50 mg/dL   Triglycerides 77 <150 mg/dL   LDL Cholesterol (Calc) 49 mg/dL (calc)    Comment: Reference range: <  100 . Desirable range <100 mg/dL for primary prevention;   <70 mg/dL for patients with CHD or diabetic patients  with > or = 2 CHD risk factors. Marland Kitchen LDL-C is now calculated using the Martin-Hopkins  calculation, which is a validated novel method providing  better accuracy than the Friedewald equation in the  estimation of LDL-C.  Cresenciano Genre et al. Annamaria Helling. 7412;878(67): 2061-2068  (http://education.[facility].com/faq/FAQ164)    Total CHOL/HDL Ratio 2.1 <5.0 (calc)   Non-HDL Cholesterol (Calc) 65 <130 mg/dL (calc)    Comment: For patients with diabetes plus 1 major ASCVD risk  factor, treating to a non-HDL-C goal of <100 mg/dL  (LDL-C of <70 mg/dL) is considered a therapeutic  option.   CBC with Differential/Platelet     Status: None   Collection Time: 11/17/20  8:54 AM  Result Value Ref Range   WBC 7.3 3.8 - 10.8 Thousand/uL   RBC 4.48 3.80 - 5.10 Million/uL   Hemoglobin 12.4 11.7 - 15.5 g/dL   HCT 38.5 35.0 - 45.0 %   MCV 85.9 80.0 - 100.0 fL   MCH 27.7 27.0 - 33.0 pg   MCHC 32.2 32.0 - 36.0 g/dL   RDW 14.4 11.0 - 15.0 %   Platelets 346 140 - 400 Thousand/uL   MPV 10.0 7.5 - 12.5 fL   Neutro Abs 4,862 1,500 - 7,800 cells/uL   Lymphs Abs 1,978 850 - 3,900 cells/uL   Absolute Monocytes 343 200 - 950 cells/uL   Eosinophils Absolute 88 15 - 500 cells/uL   Basophils Absolute 29 0 - 200 cells/uL   Neutrophils Relative % 66.6 %    Total Lymphocyte 27.1 %   Monocytes Relative 4.7 %   Eosinophils Relative 1.2 %   Basophils Relative 0.4 %  COMPLETE METABOLIC PANEL WITH GFR     Status: None   Collection Time: 11/17/20  8:54 AM  Result Value Ref Range   Glucose, Bld 96 65 - 99 mg/dL    Comment: .            Fasting reference interval .    BUN 11 7 - 25 mg/dL   Creat 0.69 0.50 - 1.03 mg/dL   eGFR 106 > OR = 60 mL/min/1.72m    Comment: The eGFR is based on the CKD-EPI 2021 equation. To calculate  the new eGFR from a previous Creatinine or Cystatin C result, go to https://www.kidney.org/professionals/ kdoqi/gfr%5Fcalculator    BUN/Creatinine Ratio NOT APPLICABLE 6 - 22 (calc)   Sodium 141 135 - 146 mmol/L   Potassium 4.4 3.5 - 5.3 mmol/L   Chloride 101 98 - 110 mmol/L   CO2 30 20 - 32 mmol/L   Calcium 9.8 8.6 - 10.4 mg/dL   Total Protein 7.1 6.1 - 8.1 g/dL   Albumin 4.0 3.6 - 5.1 g/dL   Globulin 3.1 1.9 - 3.7 g/dL (calc)   AG Ratio 1.3 1.0 - 2.5 (calc)   Total Bilirubin 0.5 0.2 - 1.2 mg/dL   Alkaline phosphatase (APISO) 70 37 - 153 U/L   AST 11 10 - 35 U/L   ALT 11 6 - 29 U/L  Hemoglobin A1c     Status: None   Collection Time: 11/17/20  8:54 AM  Result Value Ref Range   Hgb A1c MFr Bld 5.5 <5.7 % of total Hgb    Comment: For the purpose of screening for the presence of diabetes: . <5.7%       Consistent with the absence of diabetes 5.7-6.4%  Consistent with increased risk for diabetes             (prediabetes) > or =6.5%  Consistent with diabetes . This assay result is consistent with a decreased risk of diabetes. . Currently, no consensus exists regarding use of hemoglobin A1c for diagnosis of diabetes in children. . According to American Diabetes Association (ADA) guidelines, hemoglobin A1c <7.0% represents optimal control in non-pregnant diabetic patients. Different metrics may apply to specific patient populations.  Standards of Medical Care in Diabetes(ADA). .    Mean Plasma Glucose  111 mg/dL   eAG (mmol/L) 6.2 mmol/L  Hepatitis C antibody     Status: None   Collection Time: 11/17/20  8:54 AM  Result Value Ref Range   Hepatitis C Ab NON-REACTIVE NON-REACTIVE   SIGNAL TO CUT-OFF 0.01 <1.00    Comment: . HCV antibody was non-reactive. There is no laboratory  evidence of HCV infection. . In most cases, no further action is required. However, if recent HCV exposure is suspected, a test for HCV RNA (test code [id number]) is suggested. . For additional information please refer to http://education.quest[facility].com/faq/FAQ22v1 (This link is being provided for informational/ educational purposes only.) .   Advanced Written Notification [facility]) Test Refusal     Status: None   Collection Time: 11/17/20  8:54 AM  Result Value Ref Range   RAM1      Comment: . Be advised that your patient has indicated on the advance written notice their decision not to receive the following laboratory tests. As a result, the tests will not be performed.    AWN TEST REFUSED [id number]      PHQ2/9: Depression screen [facility] 2/9 01/16/2021 12/15/2020 11/17/2020 11/04/2020 05/25/2020  Decreased Interest 0 3 2 3  0  Down, Depressed, Hopeless 0 3 2 3  0  PHQ - 2 Score 0 6 4 6  0  Altered sleeping 0 3 2 3  -  Tired, decreased energy 0 3 3 3  -  Change in appetite 0 3 1 3  -  Feeling bad or failure about yourself  0 3 2 3  -  Trouble concentrating 0 3 0 3 -  Moving slowly or fidgety/restless 0 0 0 0 -  Suicidal thoughts 0 0 0 0 -  PHQ-9 Score 0 21 12 21  -  Difficult doing work/chores - Extremely dIfficult - Extremely dIfficult -  Some recent data might be hidden    phq 9 is negative   Fall Risk: Fall Risk  01/16/2021 12/15/2020 11/17/2020 11/04/2020 05/25/2020  Falls in the past year? 0 0 0 0 0  Comment - - - - -  Number falls in past yr: 0 0 0 0 0  Injury with Fall? 0 0 0 0 0  Risk for fall due to : No Fall Risks No Fall Risks - - -  Follow up Falls prevention discussed Falls prevention discussed  - - -      Functional Status Survey: Is the patient deaf or have difficulty hearing?: No Does the patient have difficulty seeing, even when wearing glasses/contacts?: No Does the patient have difficulty concentrating, remembering, or making decisions?: No Does the patient have difficulty walking or climbing stairs?: No Does the patient have difficulty dressing or bathing?: No Does the patient have difficulty doing errands alone such as visiting a doctor's office or shopping?: No    Assessment & Plan  1. Tortuous aorta (HCC)  - atorvastatin (LIPITOR) 10 MG tablet; Take 1 tablet (10 mg total) by mouth daily.  Dispense:  90 tablet; Refill: 3  2. Hot flashes  - cloNIDine (CATAPRES) 0.1 MG tablet; Take 1 tablet (0.1 mg total) by mouth at bedtime.  Dispense: 90 tablet; Refill: 1  3. Morbid obesity ([place])  Discussed with the patient the risk posed by an increased BMI. Discussed importance of portion control, calorie counting and at least 150 minutes of physical activity weekly. Avoid sweet beverages and drink more water. Eat at least 6 servings of fruit and vegetables daily    4. Hyperthyroidism, subclinical   5. Hypertension goal BP (blood pressure) < 140/90  - hydrochlorothiazide (HYDRODIURIL) 25 MG tablet; Take 1 tablet (25 mg total) by mouth daily.  Dispense: 90 tablet; Refill: 1  6. Adjustment insomnia   7. Multinodular goiter   8. Adjustment disorder with mixed anxiety and depressed mood   9. Needs flu shot  Refused

## 2021-01-17 ENCOUNTER — Ambulatory Visit: Payer: BC Managed Care – PPO | Admitting: Family Medicine

## 2021-01-18 ENCOUNTER — Telehealth: Payer: Self-pay

## 2021-01-18 NOTE — Telephone Encounter (Signed)
Called pt to see if she had a correct number for this dr but she stated that she has never heard of him. I also tried both numbers attached to this request and neither would go through  Copied from [place] [phone]. Topic: General - Inquiry >> Jan 18, 2021 12:24 PM Scherrie Gerlach wrote: Rulo with Dr Harriet Pho office would like to schedule a peer to peer with Dr Ancil Boozer concerning a disability claim   Please call [phone]  ext [phone] to set up appt for the peer to peer.

## 2021-01-19 DIAGNOSIS — F411 Generalized anxiety disorder: Secondary | ICD-10-CM | POA: Diagnosis not present

## 2021-01-19 DIAGNOSIS — F410 Panic disorder [episodic paroxysmal anxiety] without agoraphobia: Secondary | ICD-10-CM | POA: Diagnosis not present

## 2021-01-19 DIAGNOSIS — F332 Major depressive disorder, recurrent severe without psychotic features: Secondary | ICD-10-CM | POA: Diagnosis not present

## 2021-01-19 DIAGNOSIS — F5105 Insomnia due to other mental disorder: Secondary | ICD-10-CM | POA: Diagnosis not present

## 2021-01-19 DIAGNOSIS — F321 Major depressive disorder, single episode, moderate: Secondary | ICD-10-CM | POA: Diagnosis not present

## 2021-01-19 NOTE — Telephone Encounter (Signed)
Called and spoke to [place] and she will do some research and call us back

## 2021-01-19 NOTE — Telephone Encounter (Signed)
Pt called in as a FYI stating the office had the wrong number, but pt did not give a number.

## 2021-01-24 ENCOUNTER — Ambulatory Visit: Payer: Self-pay | Admitting: *Deleted

## 2021-01-24 NOTE — Telephone Encounter (Signed)
Reason for Disposition  Patient sounds very upset or troubled to the triager  Answer Assessment - Initial Assessment Questions 1. CONCERN: "Did anything happen that prompted you to call today?"      Anxious due to completion of STD paperwork  2. ANXIETY SYMPTOMS: "Can you describe how you (your loved one; patient) have been feeling?" (e.g., tense, restless, panicky, anxious, keyed up, overwhelmed, sense of impending doom).      Overwhelmed, anxious 3. ONSET: "How long have you been feeling this way?" (e.g., hours, days, weeks)     Since Friday  4. SEVERITY: "How would you rate the level of anxiety?" (e.g., 0 - 10; or mild, moderate, severe).     Na  5. FUNCTIONAL IMPAIRMENT: "How have these feelings affected your ability to do daily activities?" "Have you had more difficulty than usual doing your normal daily activities?" (e.g., getting better, same, worse; self-care, school, work, interactions)     Na  6. HISTORY: "Have you felt this way before?" "Have you ever been diagnosed with an anxiety problem in the past?" (e.g., generalized anxiety disorder, panic attacks, PTSD). If Yes, ask: "How was this problem treated?" (e.g., medicines, counseling, etc.)     Yes taking celexa hx anxiety  7. RISK OF HARM - SUICIDAL IDEATION: "Do you ever have thoughts of hurting or killing yourself?" If Yes, ask:  "Do you have these feelings now?" "Do you have a plan on how you would do this?"     na 8. TREATMENT:  "What has been done so far to treat this anxiety?" (e.g., medicines, relaxation strategies). "What has helped?"     Medication stress over completion of paper work for [employer]  9. TREATMENT - THERAPIST: "Do you have a counselor or therapist? Name?"     Dr. Renda Rolls" 10. POTENTIAL TRIGGERS: "Do you drink caffeinated beverages (e.g., coffee, colas, teas), and how much daily?" "Do you drink alcohol or use any drugs?" "Have you started any new medicines recently?"     Na  10. PATIENT SUPPORT: "Who is with you  now?" "Who do you live with?" "Do you have family or friends who you can talk to?"        No one with patient . Son will be returning soon 52. OTHER SYMPTOMS: "Do you have any other symptoms?" (e.g., feeling depressed, trouble concentrating, trouble sleeping, trouble breathing, palpitations or fast heartbeat, chest pain, sweating, nausea, or diarrhea)       Denies at this time  12. PREGNANCY: "Is there any chance you are pregnant?" "When was your last menstrual period?"       na  Protocols used: Anxiety and Panic Attack-A-AH

## 2021-01-24 NOTE — Telephone Encounter (Signed)
Forms filled out and faxed at her August visit. New forms that we received today will have to filled out by Dr. Nicolasa Ducking. Dr.Sowles will reach out to [facility] this afternoon and inform them as well as complete a peer to peer at their next available opportunity.

## 2021-01-24 NOTE — Telephone Encounter (Signed)
C/o feeling very anxious and overwhelmed since Friday 01/20/21 due to not being able to have physicians complete necessary short term disability paper work for her job. Patient reports she received a call Dr. Ancil Boozer could not get in touch with "Dr. Bryan Lemma" or a Dr that was requesting a peer to peer regarding patient status. Patient reports she has called her representative , Howell Rucks, for her STD paperwork with her job and peer to peer has not been completed due to wrong # given to PCP. Patient is requesting PCP to contact Howell Rucks at [phone] regarding required information to complete STD paperwork due to patient is not sure what requirements are needed. Patient reports she was doing well with management of anxiety and stress and now she is doing worse again due to stress of completion of paper work because she can not get the correct numbers for the PCP to do the peer to peer needed. Patient reports she is afraid she will lose her home and is getting behind on bills and would like help completing necessary information for STD. Denies chest pain, difficulty breathing or racing heart at this time. # [phone] to urgent crisis center given if more urgent need for assistance with anxiety noted. Care advise given. Patient verbalized understanding of care advise and to call back  or call her son or go to ED if symptoms worsen.

## 2021-01-26 DIAGNOSIS — F411 Generalized anxiety disorder: Secondary | ICD-10-CM | POA: Diagnosis not present

## 2021-01-26 DIAGNOSIS — F332 Major depressive disorder, recurrent severe without psychotic features: Secondary | ICD-10-CM | POA: Diagnosis not present

## 2021-01-30 DIAGNOSIS — F332 Major depressive disorder, recurrent severe without psychotic features: Secondary | ICD-10-CM | POA: Diagnosis not present

## 2021-01-30 DIAGNOSIS — F411 Generalized anxiety disorder: Secondary | ICD-10-CM | POA: Diagnosis not present

## 2021-01-31 DIAGNOSIS — F5105 Insomnia due to other mental disorder: Secondary | ICD-10-CM | POA: Diagnosis not present

## 2021-01-31 DIAGNOSIS — F410 Panic disorder [episodic paroxysmal anxiety] without agoraphobia: Secondary | ICD-10-CM | POA: Diagnosis not present

## 2021-01-31 DIAGNOSIS — F321 Major depressive disorder, single episode, moderate: Secondary | ICD-10-CM | POA: Diagnosis not present

## 2021-02-02 DIAGNOSIS — F411 Generalized anxiety disorder: Secondary | ICD-10-CM | POA: Diagnosis not present

## 2021-02-02 DIAGNOSIS — F332 Major depressive disorder, recurrent severe without psychotic features: Secondary | ICD-10-CM | POA: Diagnosis not present

## 2021-02-06 DIAGNOSIS — F332 Major depressive disorder, recurrent severe without psychotic features: Secondary | ICD-10-CM | POA: Diagnosis not present

## 2021-02-06 DIAGNOSIS — F411 Generalized anxiety disorder: Secondary | ICD-10-CM | POA: Diagnosis not present

## 2021-02-13 ENCOUNTER — Other Ambulatory Visit: Payer: Self-pay | Admitting: Family Medicine

## 2021-02-13 DIAGNOSIS — F411 Generalized anxiety disorder: Secondary | ICD-10-CM | POA: Diagnosis not present

## 2021-02-13 DIAGNOSIS — F332 Major depressive disorder, recurrent severe without psychotic features: Secondary | ICD-10-CM | POA: Diagnosis not present

## 2021-02-15 DIAGNOSIS — F410 Panic disorder [episodic paroxysmal anxiety] without agoraphobia: Secondary | ICD-10-CM | POA: Diagnosis not present

## 2021-02-15 DIAGNOSIS — F321 Major depressive disorder, single episode, moderate: Secondary | ICD-10-CM | POA: Diagnosis not present

## 2021-02-15 DIAGNOSIS — F5105 Insomnia due to other mental disorder: Secondary | ICD-10-CM | POA: Diagnosis not present

## 2021-02-16 ENCOUNTER — Other Ambulatory Visit: Payer: Self-pay

## 2021-03-07 DIAGNOSIS — F411 Generalized anxiety disorder: Secondary | ICD-10-CM | POA: Diagnosis not present

## 2021-03-07 DIAGNOSIS — F332 Major depressive disorder, recurrent severe without psychotic features: Secondary | ICD-10-CM | POA: Diagnosis not present

## 2021-03-12 ENCOUNTER — Other Ambulatory Visit: Payer: Self-pay | Admitting: Family Medicine

## 2021-03-12 DIAGNOSIS — E0590 Thyrotoxicosis, unspecified without thyrotoxic crisis or storm: Secondary | ICD-10-CM

## 2021-03-12 DIAGNOSIS — F410 Panic disorder [episodic paroxysmal anxiety] without agoraphobia: Secondary | ICD-10-CM

## 2021-03-12 DIAGNOSIS — F341 Dysthymic disorder: Secondary | ICD-10-CM

## 2021-03-12 NOTE — Telephone Encounter (Signed)
Spoke to [employer] manager) and med was taken off profile

## 2021-03-13 DIAGNOSIS — F411 Generalized anxiety disorder: Secondary | ICD-10-CM | POA: Diagnosis not present

## 2021-03-13 DIAGNOSIS — F332 Major depressive disorder, recurrent severe without psychotic features: Secondary | ICD-10-CM | POA: Diagnosis not present

## 2021-03-15 DIAGNOSIS — F321 Major depressive disorder, single episode, moderate: Secondary | ICD-10-CM | POA: Diagnosis not present

## 2021-03-15 DIAGNOSIS — F410 Panic disorder [episodic paroxysmal anxiety] without agoraphobia: Secondary | ICD-10-CM | POA: Diagnosis not present

## 2021-03-15 DIAGNOSIS — F5105 Insomnia due to other mental disorder: Secondary | ICD-10-CM | POA: Diagnosis not present

## 2021-03-23 DIAGNOSIS — F332 Major depressive disorder, recurrent severe without psychotic features: Secondary | ICD-10-CM | POA: Diagnosis not present

## 2021-03-23 DIAGNOSIS — F411 Generalized anxiety disorder: Secondary | ICD-10-CM | POA: Diagnosis not present

## 2021-03-29 ENCOUNTER — Ambulatory Visit
Admission: EM | Admit: 2021-03-29 | Discharge: 2021-03-29 | Disposition: A | Discharge status: Home / Self Care | Payer: BC Managed Care – PPO | Attending: Emergency Medicine | Admitting: Emergency Medicine

## 2021-03-29 ENCOUNTER — Encounter: Payer: Self-pay | Admitting: Emergency Medicine

## 2021-03-29 ENCOUNTER — Other Ambulatory Visit: Payer: Self-pay

## 2021-03-29 ENCOUNTER — Ambulatory Visit (INDEPENDENT_AMBULATORY_CARE_PROVIDER_SITE_OTHER): Payer: BC Managed Care – PPO

## 2021-03-29 DIAGNOSIS — M25471 Effusion, right ankle: Secondary | ICD-10-CM

## 2021-03-29 DIAGNOSIS — M79661 Pain in right lower leg: Secondary | ICD-10-CM

## 2021-03-29 DIAGNOSIS — M25571 Pain in right ankle and joints of right foot: Secondary | ICD-10-CM

## 2021-03-29 DIAGNOSIS — M79671 Pain in right foot: Secondary | ICD-10-CM

## 2021-03-29 DIAGNOSIS — M7989 Other specified soft tissue disorders: Secondary | ICD-10-CM | POA: Diagnosis not present

## 2021-03-29 NOTE — Discharge Instructions (Addendum)
Take ibuprofen or Tylenol as needed for discomfort.  Rest and elevate your right ankle.  Apply ice packs 2-3 times a day for up to 20 minutes each.  Wear the walking boot and use the crutches.     Follow up with an orthopedist tomorrow.

## 2021-03-29 NOTE — ED Triage Notes (Signed)
Pt was jumping up and down at church on Sunday and when she came down at one point, she had such bad pain in her lower leg that she almost fell. RICE protocols were performed for a couple of days and she felt better, but today presents with gross swelling over the entire lower leg, ankle and foot with bruising along the foot, inferior to the ankle on the medial side. Pain begins in the lower aspect of the calf.

## 2021-03-29 NOTE — ED Provider Notes (Signed)
Roderic Palau    CSN: [id number] Arrival date & time: 03/29/21  1754      History   Chief Complaint Chief Complaint  Patient presents with   Leg Pain    HPI Teleshia Lemere is a 50 y.o. female.  Patient presents with pain and swelling in her right lower leg and ankle.  The pain started after she was jumping up and down in church on 03/26/2021.  On her last jump, she felt acute pain when she landed and felt like her leg was giving out.  Treated with ice, elevation, rest.  No numbness, wounds, redness, or other symptoms.  Her medical history includes morbid obesity, hypertension, varicose veins, tortuous aorta.  The history is provided by the patient and medical records.   Past Medical History:  Diagnosis Date   Allergy    Blood transfusion without reported diagnosis    BP (high blood pressure) 12/29/2012   Breast hypertrophy 11/10/2014   GERD (gastroesophageal reflux disease)    Thyroid disease    patient was cleared by specialist. History of Thyroid nodules.    Patient Active Problem List   Diagnosis Date Noted   Thyroid nodule 11/17/2020   Tortuous aorta ([place]) 05/19/2020   Fatty liver 02/26/2020   Lymphedema 01/21/2019   Varicose veins of leg with pain, bilateral 10/14/2018   Lactose intolerance 09/26/2015   IBS (irritable bowel syndrome) 09/26/2015   Morbid obesity ([place]) 09/28/2014   Hyperthyroidism, subclinical 09/28/2014   Allergic rhinitis, seasonal 09/28/2014   Hypertension goal BP (blood pressure) < 140/90 12/29/2012    Past Surgical History:  Procedure Laterality Date   ABDOMINAL HYSTERECTOMY     patient still has ovaries   BREAST REDUCTION SURGERY Bilateral 08/05/2017   Procedure: BREAST REDUCTION WITH LIPOSUCTION;  Surgeon: Cristine Polio, MD;  Location: [place];  Service: Plastics;  Laterality: Bilateral;   CHOLECYSTECTOMY     COLONOSCOPY WITH PROPOFOL N/A 03/26/2018   Procedure: COLONOSCOPY WITH PROPOFOL;  Surgeon: Jonathon Bellows, MD;  Location[facility] ENDOSCOPY;  Service: Gastroenterology;  Laterality: N/A;   ESOPHAGOGASTRODUODENOSCOPY (EGD) WITH PROPOFOL N/A 03/26/2018   Procedure: ESOPHAGOGASTRODUODENOSCOPY (EGD) WITH PROPOFOL;  Surgeon: Jonathon Bellows, MD;  Location: [facility] ENDOSCOPY;  Service: Gastroenterology;  Laterality: N/A;   GIVENS CAPSULE STUDY N/A 04/14/2018   Procedure: GIVENS CAPSULE STUDY;  Surgeon: Jonathon Bellows, MD;  Location: [facility] ENDOSCOPY;  Service: Gastroenterology;  Laterality: N/A;   REDUCTION MAMMAPLASTY     2019   SHOULDER ARTHROSCOPY Right    TUBAL LIGATION      OB History     Gravida  4   Para  4   Term      Preterm      AB      Living  4      SAB      IAB      Ectopic      Multiple      Live Births               Home Medications    Prior to Admission medications   Medication Sig Start Date End Date Taking? Authorizing Provider  atorvastatin (LIPITOR) 10 MG tablet Take 1 tablet (10 mg total) by mouth daily. 01/16/21   Steele Sizer, MD  busPIRone (BUSPAR) 15 MG tablet Take 15 mg by mouth 2 (two) times daily. 01/31/21   [provider]  cloNIDine (CATAPRES) 0.1 MG tablet Take 1 tablet (0.1 mg total) by mouth at bedtime. 01/16/21  Steele Sizer, MD  fluticasone [facility]) 50 MCG/ACT nasal spray Place 2 sprays into both nostrils daily as needed. 03/29/20   Delsa Grana, PA-C  hydrochlorothiazide (HYDRODIURIL) 25 MG tablet Take 1 tablet (25 mg total) by mouth daily. 01/16/21 02/15/21  Steele Sizer, MD  hydrOXYzine (VISTARIL) 25 MG capsule PLEASE SEE ATTACHED FOR DETAILED DIRECTIONS 12/25/20   [provider]  traZODone (DESYREL) 50 MG tablet Take 100 mg by mouth at bedtime as needed. 12/20/20   Chauncey Mann, MD    Family History Family History  Problem Relation Age of Onset   Hyperthyroidism Sister    Alcohol abuse Brother     Social History Social History   Tobacco Use   Smoking status: Never   Smokeless tobacco: Never  Vaping Use    Vaping Use: Never used  Substance Use Topics   Alcohol use: Yes    Alcohol/week: 0.0 standard drinks    Comment: socially   Drug use: No     Allergies   Percocet [oxycodone-acetaminophen]   Review of Systems Review of Systems  Musculoskeletal:  Positive for arthralgias, gait problem and joint swelling.  Skin:  Negative for color change, rash and wound.  Neurological:  Negative for weakness and numbness.  All other systems reviewed and are negative.   Physical Exam Triage Vital Signs ED Triage Vitals  Enc Vitals Group     BP      Pulse      Resp      Temp      Temp src      SpO2      Weight      Height      Head Circumference      Peak Flow      Pain Score      Pain Loc      Pain Edu?      Excl. in [place]?    No data found.  Updated Vital Signs BP 127/85 (BP Location: Left Arm)   Pulse 80   Temp 97.9 F (36.6 C) (Oral)   Resp 18   SpO2 97%   Visual Acuity Right Eye Distance:   Left Eye Distance:   Bilateral Distance:    Right Eye Near:   Left Eye Near:    Bilateral Near:     Physical Exam Vitals and nursing note reviewed.  Constitutional:      General: She is not in acute distress.    Appearance: She is well-developed. She is obese.  HENT:     Mouth/Throat:     Mouth: Mucous membranes are moist.  Cardiovascular:     Rate and Rhythm: Normal rate and regular rhythm.     Heart sounds: Normal heart sounds.  Pulmonary:     Effort: Pulmonary effort is normal. No respiratory distress.     Breath sounds: Normal breath sounds.  Musculoskeletal:        General: Swelling and tenderness present. No deformity. Normal range of motion.     Cervical back: Neck supple.     Comments: Tenderness of posterior ankle and lower leg over Achilles tendon. Moderate edema of right ankle and foot.  Mild ecchymosis of upper foot.  No wounds or erythema. No calf tenderness.   Skin:    General: Skin is warm and dry.     Capillary Refill: Capillary refill takes less than  2 seconds.     Findings: Bruising present. No erythema or lesion.  Neurological:     General: No  focal deficit present.     Mental Status: She is alert and oriented to person, place, and time.     Sensory: No sensory deficit.     Motor: No weakness.     Gait: Gait abnormal.  Psychiatric:        Mood and Affect: Mood normal.        Behavior: Behavior normal.     UC Treatments / Results  Labs (all labs ordered are listed, but only abnormal results are displayed) Labs Reviewed - No data to display  EKG   Radiology DG Ankle Complete Right  Result Date: 03/29/2021 CLINICAL DATA:  Right ankle pain EXAM: RIGHT ANKLE - COMPLETE 3+ VIEW COMPARISON:  None. FINDINGS: Normal alignment. No acute fracture or dislocation. Ankle mortise is aligned. No ankle effusion. Moderate bimalleolar soft tissue swelling. Dystrophic calcification noted in the expected insertion of the Achilles tendon upon the calcaneus as well as within Kager's fat, possibly posttraumatic or post inflammatory in nature. IMPRESSION: Bimalleolar soft tissue swelling.  No acute fracture or dislocation. Electronically Signed   By: Fidela Salisbury M.D.   On: 03/29/2021 18:53    Procedures Procedures (including critical care time)  Medications Ordered in UC Medications - No data to display  Initial Impression / Assessment and Plan / UC Course  I have reviewed the triage vital signs and the nursing notes.  Pertinent labs & imaging results that were available during my care of the patient were reviewed by me and considered in my medical decision making (see chart for details).   Pain and swelling of right ankle, pain in right lower leg and foot.  Xray shows bimalleoloar soft tissue swelling; no acute fracture or dislocation.  No walking boot here that will fit patient.  Treating with ace wrap and crutches.  Sending to [facility]'s walk-in clinic that is still open now.  Patient is agreeable to this plan of care.        Final  Clinical Impressions(s) / UC Diagnoses   Final diagnoses:  Pain and swelling of right ankle  Pain of right lower leg  Pain of right foot     Discharge Instructions      Take ibuprofen or Tylenol as needed for discomfort.  Rest and elevate your right ankle.  Apply ice packs 2-3 times a day for up to 20 minutes each.  Wear the walking boot and use the crutches.     Follow up with an orthopedist tomorrow.        ED Prescriptions   None    PDMP not reviewed this encounter.   Sharion Balloon, NP 03/29/21 1910

## 2021-03-30 DIAGNOSIS — S86011A Strain of right Achilles tendon, initial encounter: Secondary | ICD-10-CM | POA: Insufficient documentation

## 2021-03-30 DIAGNOSIS — M25579 Pain in unspecified ankle and joints of unspecified foot: Secondary | ICD-10-CM | POA: Insufficient documentation

## 2021-03-30 DIAGNOSIS — M25571 Pain in right ankle and joints of right foot: Secondary | ICD-10-CM | POA: Diagnosis not present

## 2021-03-30 DIAGNOSIS — S86091A Other specified injury of right Achilles tendon, initial encounter: Secondary | ICD-10-CM | POA: Diagnosis not present

## 2021-03-31 DIAGNOSIS — S86091A Other specified injury of right Achilles tendon, initial encounter: Secondary | ICD-10-CM | POA: Diagnosis not present

## 2021-03-31 DIAGNOSIS — F411 Generalized anxiety disorder: Secondary | ICD-10-CM | POA: Diagnosis not present

## 2021-03-31 DIAGNOSIS — F332 Major depressive disorder, recurrent severe without psychotic features: Secondary | ICD-10-CM | POA: Diagnosis not present

## 2021-04-03 DIAGNOSIS — S86091A Other specified injury of right Achilles tendon, initial encounter: Secondary | ICD-10-CM | POA: Diagnosis not present

## 2021-04-05 DIAGNOSIS — S86091A Other specified injury of right Achilles tendon, initial encounter: Secondary | ICD-10-CM | POA: Diagnosis not present

## 2021-04-11 DIAGNOSIS — F411 Generalized anxiety disorder: Secondary | ICD-10-CM | POA: Diagnosis not present

## 2021-04-11 DIAGNOSIS — F332 Major depressive disorder, recurrent severe without psychotic features: Secondary | ICD-10-CM | POA: Diagnosis not present

## 2021-04-20 DIAGNOSIS — S86091A Other specified injury of right Achilles tendon, initial encounter: Secondary | ICD-10-CM | POA: Diagnosis not present

## 2021-04-21 DIAGNOSIS — Z20822 Contact with and (suspected) exposure to covid-19: Secondary | ICD-10-CM | POA: Diagnosis not present

## 2021-04-25 DIAGNOSIS — F321 Major depressive disorder, single episode, moderate: Secondary | ICD-10-CM | POA: Diagnosis not present

## 2021-04-25 DIAGNOSIS — F5105 Insomnia due to other mental disorder: Secondary | ICD-10-CM | POA: Diagnosis not present

## 2021-04-25 DIAGNOSIS — F410 Panic disorder [episodic paroxysmal anxiety] without agoraphobia: Secondary | ICD-10-CM | POA: Diagnosis not present

## 2021-04-27 DIAGNOSIS — F332 Major depressive disorder, recurrent severe without psychotic features: Secondary | ICD-10-CM | POA: Diagnosis not present

## 2021-04-27 DIAGNOSIS — F411 Generalized anxiety disorder: Secondary | ICD-10-CM | POA: Diagnosis not present

## 2021-05-02 ENCOUNTER — Encounter: Payer: Self-pay | Admitting: Family Medicine

## 2021-05-03 DIAGNOSIS — S86091D Other specified injury of right Achilles tendon, subsequent encounter: Secondary | ICD-10-CM | POA: Diagnosis not present

## 2021-05-04 DIAGNOSIS — F332 Major depressive disorder, recurrent severe without psychotic features: Secondary | ICD-10-CM | POA: Diagnosis not present

## 2021-05-04 DIAGNOSIS — F411 Generalized anxiety disorder: Secondary | ICD-10-CM | POA: Diagnosis not present

## 2021-05-06 DIAGNOSIS — S86091A Other specified injury of right Achilles tendon, initial encounter: Secondary | ICD-10-CM | POA: Diagnosis not present

## 2021-05-08 DIAGNOSIS — S86091D Other specified injury of right Achilles tendon, subsequent encounter: Secondary | ICD-10-CM | POA: Diagnosis not present

## 2021-05-10 DIAGNOSIS — S86091D Other specified injury of right Achilles tendon, subsequent encounter: Secondary | ICD-10-CM | POA: Diagnosis not present

## 2021-05-15 DIAGNOSIS — S86091D Other specified injury of right Achilles tendon, subsequent encounter: Secondary | ICD-10-CM | POA: Diagnosis not present

## 2021-05-15 NOTE — Progress Notes (Addendum)
Name: Wendy Kent   MRN: [id number]    DOB: 1970/12/16   Date:05/16/2021       Progress Note  Subjective  Chief Complaint  Follow Up  HPI  HTN: she is only taking clonidine 0.1 mg qhs to control hot flashes and HCTZ in am for bp control. No side effects.  No chest pain, palpitation or dizziness.   Adjustment disorder with depressive  symptoms started a few months ago when her youngest son was about to graduate from [employer] and move to [place]. She started to get scared that she would be all alone.  She saw me in July and we discussed referral to psychiatrist and to continue seeing therapist ( usually virtually) Her son Lytle Michaels ( 3 rd child ) and Larkin Ina (youngest ) are at home now. She also has two older sons that live in [place]. Larkin Ina moved to [place] to live with Lytle Michaels ( that is in [employer]) and was planning on joining the [employer] himself but moved back home because she is not doing well. Since her last visit with me she was seen by Dr. Nicolasa Ducking, has tried Hydroxizine, Celexa and trazodone. She states therapy has really helped her. Seeing La[facility]ie and doing some mindfulness exercises .   Tortuous aorta : last LDL was at goal, taking statin   Thyromegaly and sub-clinical hyperthyroidism: seen by Dr. Honor Junes 08/22 and had [place] and given reassurance, going back yearly. Not on medications at this time and asymptomatic, no palpitation change in bowel movements, no dysphagia.   Morbid obesity: BMI above 40, she has changed her diet  History of COVID-19: last episode Dec 2022. She still has a mild dry cough but otherwise no other symptoms.   Right Achilles tendon rupture: while worshiping at her church in Dec, wearing a boot, getting PT , also has crutches.   Perennial AR with seasonal variation and eustachian tube dysfunction: using flonase and it works well for her.   Patient Active Problem List   Diagnosis Date Noted   Ankle pain 03/30/2021   Rupture of right Achilles tendon 03/30/2021   Thyroid nodule  11/17/2020   Tortuous aorta ([place]) 05/19/2020   Fatty liver 02/26/2020   Lymphedema 01/21/2019   Varicose veins of leg with pain, bilateral 10/14/2018   Lactose intolerance 09/26/2015   IBS (irritable bowel syndrome) 09/26/2015   Morbid obesity ([place]) 09/28/2014   Hyperthyroidism, subclinical 09/28/2014   Allergic rhinitis, seasonal 09/28/2014   Hypertension goal BP (blood pressure) < 140/90 12/29/2012    Past Surgical History:  Procedure Laterality Date   ABDOMINAL HYSTERECTOMY     patient still has ovaries   BREAST REDUCTION SURGERY Bilateral 08/05/2017   Procedure: BREAST REDUCTION WITH LIPOSUCTION;  Surgeon: Cristine Polio, MD;  Location: [place];  Service: Plastics;  Laterality: Bilateral;   CHOLECYSTECTOMY     COLONOSCOPY WITH PROPOFOL N/A 03/26/2018   Procedure: COLONOSCOPY WITH PROPOFOL;  Surgeon: Jonathon Bellows, MD;  Location: [facility] ENDOSCOPY;  Service: Gastroenterology;  Laterality: N/A;   ESOPHAGOGASTRODUODENOSCOPY (EGD) WITH PROPOFOL N/A 03/26/2018   Procedure: ESOPHAGOGASTRODUODENOSCOPY (EGD) WITH PROPOFOL;  Surgeon: Jonathon Bellows, MD;  Location: [facility] ENDOSCOPY;  Service: Gastroenterology;  Laterality: N/A;   GIVENS CAPSULE STUDY N/A 04/14/2018   Procedure: GIVENS CAPSULE STUDY;  Surgeon: Jonathon Bellows, MD;  Location: [facility] ENDOSCOPY;  Service: Gastroenterology;  Laterality: N/A;   REDUCTION MAMMAPLASTY     2019   SHOULDER ARTHROSCOPY Right    TUBAL LIGATION      Family History  Problem  Relation Age of Onset   Hyperthyroidism Sister    Alcohol abuse Brother     Social History   Tobacco Use   Smoking status: Never   Smokeless tobacco: Never  Substance Use Topics   Alcohol use: Yes    Alcohol/week: 0.0 standard drinks    Comment: socially     Current Outpatient Medications:    atorvastatin (LIPITOR) 10 MG tablet, Take 1 tablet (10 mg total) by mouth daily., Disp: 90 tablet, Rfl: 3   busPIRone (BUSPAR) 15 MG tablet, Take 15 mg by mouth daily as  needed., Disp: , Rfl:    citalopram (CELEXA) 40 MG tablet, Take 40 mg by mouth every morning., Disp: , Rfl:    hydrOXYzine (VISTARIL) 25 MG capsule, Take 1 capsule by mouth 2 (two) times daily as needed., Disp: , Rfl:    traMADol (ULTRAM) 50 MG tablet, Take 1 tablet by mouth in the morning, at noon, in the evening, and at bedtime., Disp: , Rfl:    traZODone (DESYREL) 50 MG tablet, Take 100 mg by mouth at bedtime as needed., Disp: , Rfl:    cloNIDine (CATAPRES) 0.1 MG tablet, Take 1 tablet (0.1 mg total) by mouth at bedtime., Disp: 90 tablet, Rfl: 1   fluticasone (FLONASE) 50 MCG/ACT nasal spray, Place 2 sprays into both nostrils daily as needed., Disp: 48 mL, Rfl: 3   hydrochlorothiazide (HYDRODIURIL) 25 MG tablet, Take 1 tablet (25 mg total) by mouth daily., Disp: 90 tablet, Rfl: 1  Allergies  Allergen Reactions   Percocet [Oxycodone-Acetaminophen] Anaphylaxis    I personally reviewed active problem list, medication list, allergies, family history, social history, health maintenance with the patient/caregiver today.   ROS  Constitutional: Negative for fever or weight change.  Respiratory: Negative for cough and shortness of breath.   Cardiovascular: Negative for chest pain or palpitations.  Gastrointestinal: Negative for abdominal pain, no bowel changes.  Musculoskeletal: positive  for gait problem and right  joint swelling.  Skin: Negative for rash.  Neurological: Negative for dizziness or headache.  No other specific complaints in a complete review of systems (except as listed in HPI above).   Objective   Vitals:   05/16/21 1532  BP: 118/80  Pulse: 100  Resp: 16  Temp: 97.9 F (36.6 C)  TempSrc: Oral  SpO2: 98%  Weight: 226 lb 3.2 oz (102.6 kg)  Height: 5\' 2"  (1.575 m)    Body mass index is 41.37 kg/m.  Physical Exam  Constitutional: Patient appears well-developed and well-nourished. Obese  No distress.  HEENT: head atraumatic, normocephalic, pupils equal and  reactive to light,, neck supple Cardiovascular: Normal rate, regular rhythm and normal heart sounds.  No murmur heard. No BLE edema. Pulmonary/Chest: Effort normal and breath sounds normal. No respiratory distress. Abdominal: Soft.  There is no tenderness. Psychiatric: Patient has a normal mood and affect. behavior is normal. Judgment and thought content normal.   PHQ2/9: Depression screen [facility] 2/9 05/16/2021 01/16/2021 12/15/2020 11/17/2020 11/04/2020  Decreased Interest 0 0 3 2 3   Down, Depressed, Hopeless 0 0 3 2 3   PHQ - 2 Score 0 0 6 4 6   Altered sleeping 0 0 3 2 3   Tired, decreased energy 0 0 3 3 3   Change in appetite 0 0 3 1 3   Feeling bad or failure about yourself  0 0 3 2 3   Trouble concentrating 0 0 3 0 3  Moving slowly or fidgety/restless 0 0 0 0 0  Suicidal thoughts 0 0 0  0 0  PHQ-9 Score 0 0 21 12 21   Difficult doing work/chores - - Extremely dIfficult - Extremely dIfficult  Some recent data might be hidden    phq 9 is negative   Fall Risk: Fall Risk  05/16/2021 01/16/2021 12/15/2020 11/17/2020 11/04/2020  Falls in the past year? 0 0 0 0 0  Comment - - - - -  Number falls in past yr: - 0 0 0 0  Injury with Fall? - 0 0 0 0  Risk for fall due to : - No Fall Risks No Fall Risks - -  Follow up Falls prevention discussed Falls prevention discussed Falls prevention discussed - -      Functional Status Survey: Is the patient deaf or have difficulty hearing?: No Does the patient have difficulty seeing, even when wearing glasses/contacts?: No Does the patient have difficulty concentrating, remembering, or making decisions?: No Does the patient have difficulty walking or climbing stairs?: No Does the patient have difficulty dressing or bathing?: No Does the patient have difficulty doing errands alone such as visiting a doctor's office or shopping?: No    Assessment & Plan  1. Tortuous aorta (H[facility])  On statin therapy   2. Hyperthyroidism, subclinical  She will have labs  done when she sees Dr. Honor Junes - yearly   3. Morbid obesity ([employer])  Discussed with the patient the risk posed by an increased BMI. Discussed importance of portion control, calorie counting and at least 150 minutes of physical activity weekly. Avoid sweet beverages and drink more water. Eat at least 6 servings of fruit and vegetables daily    4. Adjustment insomnia   5. Dysthymia   6. Multinodular goiter   7. Rupture of right Achilles tendon, subsequent encounter  Under the care of Dr. Sharlet Salina   8. Hot flashes  - cloNIDine (CATAPRES) 0.1 MG tablet; Take 1 tablet (0.1 mg total) by mouth at bedtime.  Dispense: 90 tablet; Refill: 1  9. Hypertension goal BP (blood pressure) < 140/90  - hydrochlorothiazide (HYDRODIURIL) 25 MG tablet; Take 1 tablet (25 mg total) by mouth daily.  Dispense: 90 tablet; Refill: 1  10. Dysfunction of right eustachian tube  - fluticasone (FLONASE) 50 MCG/ACT nasal spray; Place 2 sprays into both nostrils daily as needed.  Dispense: 48 mL; Refill: 3  11. Perennial allergic rhinitis with seasonal variation  - fluticasone (FLONASE) 50 MCG/ACT nasal spray; Place 2 sprays into both nostrils daily as needed.  Dispense: 48 mL; Refill: 3

## 2021-05-16 ENCOUNTER — Ambulatory Visit: Payer: BC Managed Care – PPO | Admitting: Family Medicine

## 2021-05-16 ENCOUNTER — Encounter: Payer: Self-pay | Admitting: Family Medicine

## 2021-05-16 ENCOUNTER — Other Ambulatory Visit: Payer: Self-pay

## 2021-05-16 VITALS — BP 118/80 | HR 100 | Temp 97.9°F | Resp 16 | Ht 62.0 in | Wt 226.2 lb

## 2021-05-16 DIAGNOSIS — I771 Stricture of artery: Secondary | ICD-10-CM

## 2021-05-16 DIAGNOSIS — F5102 Adjustment insomnia: Secondary | ICD-10-CM

## 2021-05-16 DIAGNOSIS — F341 Dysthymic disorder: Secondary | ICD-10-CM

## 2021-05-16 DIAGNOSIS — E0590 Thyrotoxicosis, unspecified without thyrotoxic crisis or storm: Secondary | ICD-10-CM

## 2021-05-16 DIAGNOSIS — J302 Other seasonal allergic rhinitis: Secondary | ICD-10-CM

## 2021-05-16 DIAGNOSIS — R232 Flushing: Secondary | ICD-10-CM

## 2021-05-16 DIAGNOSIS — S86011D Strain of right Achilles tendon, subsequent encounter: Secondary | ICD-10-CM

## 2021-05-16 DIAGNOSIS — J3089 Other allergic rhinitis: Secondary | ICD-10-CM

## 2021-05-16 DIAGNOSIS — H6981 Other specified disorders of Eustachian tube, right ear: Secondary | ICD-10-CM

## 2021-05-16 DIAGNOSIS — I10 Essential (primary) hypertension: Secondary | ICD-10-CM

## 2021-05-16 DIAGNOSIS — E042 Nontoxic multinodular goiter: Secondary | ICD-10-CM

## 2021-05-16 DIAGNOSIS — H6991 Unspecified Eustachian tube disorder, right ear: Secondary | ICD-10-CM

## 2021-05-16 MED ORDER — FLUTICASONE PROPIONATE 50 MCG/ACT NA SUSP: 2.0000 | Freq: Every day | NASAL | 3 refills | Status: DC | PRN

## 2021-05-16 MED ORDER — CLONIDINE HCL 0.1 MG PO TABS: 0.1000 mg | ORAL_TABLET | Freq: Every day | ORAL | 1 refills | Status: DC

## 2021-05-16 MED ORDER — HYDROCHLOROTHIAZIDE 25 MG PO TABS: 25.0000 mg | ORAL_TABLET | Freq: Every day | ORAL | 1 refills | Status: DC

## 2021-05-17 DIAGNOSIS — F411 Generalized anxiety disorder: Secondary | ICD-10-CM | POA: Diagnosis not present

## 2021-05-17 DIAGNOSIS — F332 Major depressive disorder, recurrent severe without psychotic features: Secondary | ICD-10-CM | POA: Diagnosis not present

## 2021-05-18 DIAGNOSIS — S86091D Other specified injury of right Achilles tendon, subsequent encounter: Secondary | ICD-10-CM | POA: Diagnosis not present

## 2021-05-22 DIAGNOSIS — S86091D Other specified injury of right Achilles tendon, subsequent encounter: Secondary | ICD-10-CM | POA: Diagnosis not present

## 2021-05-24 DIAGNOSIS — S86091D Other specified injury of right Achilles tendon, subsequent encounter: Secondary | ICD-10-CM | POA: Diagnosis not present

## 2021-05-26 DIAGNOSIS — S86091D Other specified injury of right Achilles tendon, subsequent encounter: Secondary | ICD-10-CM | POA: Diagnosis not present

## 2021-05-29 DIAGNOSIS — F411 Generalized anxiety disorder: Secondary | ICD-10-CM | POA: Diagnosis not present

## 2021-05-29 DIAGNOSIS — F332 Major depressive disorder, recurrent severe without psychotic features: Secondary | ICD-10-CM | POA: Diagnosis not present

## 2021-05-30 DIAGNOSIS — S86091D Other specified injury of right Achilles tendon, subsequent encounter: Secondary | ICD-10-CM | POA: Diagnosis not present

## 2021-06-01 DIAGNOSIS — S86091D Other specified injury of right Achilles tendon, subsequent encounter: Secondary | ICD-10-CM | POA: Diagnosis not present

## 2021-06-08 ENCOUNTER — Other Ambulatory Visit: Payer: Self-pay

## 2021-06-08 ENCOUNTER — Encounter: Payer: Self-pay | Admitting: Nurse Practitioner

## 2021-06-08 ENCOUNTER — Ambulatory Visit: Payer: BC Managed Care – PPO | Admitting: Nurse Practitioner

## 2021-06-08 DIAGNOSIS — E889 Metabolic disorder, unspecified: Secondary | ICD-10-CM | POA: Diagnosis not present

## 2021-06-08 MED ORDER — WEGOVY 0.5 MG/0.5ML ~~LOC~~ SOAJ: 0.5000 mg | SUBCUTANEOUS | 0 refills | Status: DC

## 2021-06-08 NOTE — Progress Notes (Signed)
BP 128/76    Pulse 94    Temp 97.9 F (36.6 C) (Oral)    Resp 18    Ht 5' 2"  (1.575 m)    Wt 226 lb 1.6 oz (102.6 kg)    SpO2 96%    BMI 41.35 kg/m    Subjective:    Patient ID: Wendy Kent, female    DOB: Nov 25, 1970, 51 y.o.   MRN: [id number]  HPI: Wendy Kent is a 51 y.o. female  Chief Complaint  Patient presents with   Obesity    Discuss weight loss   Obesity/Metabolic disorder: Her current weight is 226 lbs.  Her heighest weight 250s. She is aware of weight increasing her likely hood of developing diabetes, hypertension, coronary disease and stroke. She has tried adipex, phentermine and saxenda with little improvement.  She would like to try something else. Discussed wegovy.  Discussed side effects and usage.  She denies any family history of thyroid cancer or personal history of pancreatitis. She says since her achillis tendon injury she is working with physical therapy.  She says she only eats two meals a day.  Suggested she drink a protein shake in the afternoon. Gave sample of wegovy in office and sent prescription for second month dose.    Relevant past medical, surgical, family and social history reviewed and updated as indicated. Interim medical history since our last visit reviewed. Allergies and medications reviewed and updated.  Review of Systems  Constitutional: Negative for fever or weight change.  Respiratory: Negative for cough and shortness of breath.   Cardiovascular: Negative for chest pain or palpitations.  Gastrointestinal: Negative for abdominal pain, no bowel changes.  Musculoskeletal: Negative for gait problem or joint swelling.  Skin: Negative for rash.  Neurological: Negative for dizziness or headache.  No other specific complaints in a complete review of systems (except as listed in HPI above).      Objective:    BP 128/76    Pulse 94    Temp 97.9 F (36.6 C) (Oral)    Resp 18    Ht 5' 2"  (1.575 m)    Wt 226 lb 1.6 oz (102.6 kg)    SpO2 96%     BMI 41.35 kg/m   Wt Readings from Last 3 Encounters:  06/08/21 226 lb 1.6 oz (102.6 kg)  05/16/21 226 lb 3.2 oz (102.6 kg)  01/16/21 226 lb (102.5 kg)    Physical Exam  Constitutional: Patient appears well-developed and well-nourished. Obese  No distress.  HEENT: head atraumatic, normocephalic, pupils equal and reactive to light, neck supple Cardiovascular: Normal rate, regular rhythm and normal heart sounds.  No murmur heard. No BLE edema. Pulmonary/Chest: Effort normal and breath sounds normal. No respiratory distress. Abdominal: Soft.  There is no tenderness.  Psychiatric: Patient has a normal mood and affect. behavior is normal. Judgment and thought content normal.   Results for orders placed or performed in visit on 11/17/20  TSH  Result Value Ref Range   TSH 0.31 (L) mIU/L  Lipid panel  Result Value Ref Range   Cholesterol 123 <200 mg/dL   HDL 58 > OR = 50 mg/dL   Triglycerides 77 <150 mg/dL   LDL Cholesterol (Calc) 49 mg/dL (calc)   Total CHOL/HDL Ratio 2.1 <5.0 (calc)   Non-HDL Cholesterol (Calc) 65 <130 mg/dL (calc)  CBC with Differential/Platelet  Result Value Ref Range   WBC 7.3 3.8 - 10.8 Thousand/uL   RBC 4.48 3.80 - 5.10 Million/uL   Hemoglobin  12.4 11.7 - 15.5 g/dL   HCT 38.5 35.0 - 45.0 %   MCV 85.9 80.0 - 100.0 fL   MCH 27.7 27.0 - 33.0 pg   MCHC 32.2 32.0 - 36.0 g/dL   RDW 14.4 11.0 - 15.0 %   Platelets 346 140 - 400 Thousand/uL   MPV 10.0 7.5 - 12.5 fL   Neutro Abs 4,862 1,500 - 7,800 cells/uL   Lymphs Abs 1,978 850 - 3,900 cells/uL   Absolute Monocytes 343 200 - 950 cells/uL   Eosinophils Absolute 88 15 - 500 cells/uL   Basophils Absolute 29 0 - 200 cells/uL   Neutrophils Relative % 66.6 %   Total Lymphocyte 27.1 %   Monocytes Relative 4.7 %   Eosinophils Relative 1.2 %   Basophils Relative 0.4 %  COMPLETE METABOLIC PANEL WITH GFR  Result Value Ref Range   Glucose, Bld 96 65 - 99 mg/dL   BUN 11 7 - 25 mg/dL   Creat 0.69 0.50 - 1.03 mg/dL    eGFR 106 > OR = 60 mL/min/1.74m   BUN/Creatinine Ratio NOT APPLICABLE 6 - 22 (calc)   Sodium 141 135 - 146 mmol/L   Potassium 4.4 3.5 - 5.3 mmol/L   Chloride 101 98 - 110 mmol/L   CO2 30 20 - 32 mmol/L   Calcium 9.8 8.6 - 10.4 mg/dL   Total Protein 7.1 6.1 - 8.1 g/dL   Albumin 4.0 3.6 - 5.1 g/dL   Globulin 3.1 1.9 - 3.7 g/dL (calc)   AG Ratio 1.3 1.0 - 2.5 (calc)   Total Bilirubin 0.5 0.2 - 1.2 mg/dL   Alkaline phosphatase (APISO) 70 37 - 153 U/L   AST 11 10 - 35 U/L   ALT 11 6 - 29 U/L  Hemoglobin A1c  Result Value Ref Range   Hgb A1c MFr Bld 5.5 <5.7 % of total Hgb   Mean Plasma Glucose 111 mg/dL   eAG (mmol/L) 6.2 mmol/L  Hepatitis C antibody  Result Value Ref Range   Hepatitis C Ab NON-REACTIVE NON-REACTIVE   SIGNAL TO CUT-OFF 0.01 <1.00  Advanced Written Notification ([facility] Test Refusal  Result Value Ref Range   RAM1     AWN TEST REFUSED [id number]      Assessment & Plan:   1. [place] -monitor portion size -increase physical activity as tolerated - Semaglutide-Weight Management (WEGOVY) 0.5 MG/0.5ML SOAJ; Inject 0.5 mg into the skin once a week.  Dispense: 2 mL; Refill: 0  2. Metabolic disorder -monitor portion size -increase physical activity as tolerated - Semaglutide-Weight Management (WEGOVY) 0.5 MG/0.5ML SOAJ; Inject 0.5 mg into the skin once a week.  Dispense: 2 mL; Refill: 0   Follow up plan: Return in about 3 months (around 09/05/2021) for follow up.

## 2021-06-12 ENCOUNTER — Other Ambulatory Visit: Payer: Self-pay | Admitting: Nurse Practitioner

## 2021-06-12 DIAGNOSIS — E889 Metabolic disorder, unspecified: Secondary | ICD-10-CM

## 2021-06-12 DIAGNOSIS — F411 Generalized anxiety disorder: Secondary | ICD-10-CM | POA: Diagnosis not present

## 2021-06-12 DIAGNOSIS — F332 Major depressive disorder, recurrent severe without psychotic features: Secondary | ICD-10-CM | POA: Diagnosis not present

## 2021-06-13 NOTE — Telephone Encounter (Signed)
Spoke with Pharmacist and updated him that sample/starter pack was initiated in office. He stated he would notate and get the next one ready for her.

## 2021-06-13 NOTE — Telephone Encounter (Signed)
Requested medication (s) are due for refill today: no  Requested medication (s) are on the active medication list: yes  Last refill:  06/08/21 #52mL/0  Future visit scheduled: yes  Notes to clinic:  see pharmacy note. Please advise     Requested Prescriptions  Pending Prescriptions Disp Refills   WEGOVY 0.25 MG/0.5ML SOAJ [Pharmacy Med Name: WEGOVY 0.25 MG/0.5 ML PEN]  0     Endocrinology:  Diabetes - GLP-1 Receptor Agonists - semaglutide Failed - 06/12/2021  9:51 AM      Failed - HBA1C in normal range and within 180 days    Hgb A1c MFr Bld  Date Value Ref Range Status  11/17/2020 5.5 <5.7 % of total Hgb Final    Comment:    For the purpose of screening for the presence of diabetes: . <5.7%       Consistent with the absence of diabetes 5.7-6.4%    Consistent with increased risk for diabetes             (prediabetes) > or =6.5%  Consistent with diabetes . This assay result is consistent with a decreased risk of diabetes. . Currently, no consensus exists regarding use of hemoglobin A1c for diagnosis of diabetes in children. . According to American Diabetes Association (ADA) guidelines, hemoglobin A1c <7.0% represents optimal control in non-pregnant diabetic patients. Different metrics may apply to specific patient populations.  Standards of Medical Care in Diabetes(ADA). .           Passed - Cr in normal range and within 360 days    Creat  Date Value Ref Range Status  11/17/2020 0.69 0.50 - 1.03 mg/dL Final          Passed - Valid encounter within last 6 months    Recent Outpatient Visits           5 days ago Morbid obesity [facility])   [place], FNP   4 weeks ago Tortuous aorta [facility])   [facility] Steele Sizer, MD   4 months ago Tortuous aorta [facility])   [facility] Steele Sizer, MD   6 months ago Panic attack   [facility] Steele Sizer, MD   6 months ago  Hyperthyroidism, subclinical   [facility] Steele Sizer, MD       Future Appointments             In 2 months Steele Sizer, MD [facility], [place]   In 5 months Steele Sizer, MD [facility], [facility]

## 2021-06-14 DIAGNOSIS — S86091D Other specified injury of right Achilles tendon, subsequent encounter: Secondary | ICD-10-CM | POA: Diagnosis not present

## 2021-06-21 DIAGNOSIS — S86091D Other specified injury of right Achilles tendon, subsequent encounter: Secondary | ICD-10-CM | POA: Diagnosis not present

## 2021-06-23 DIAGNOSIS — S86091D Other specified injury of right Achilles tendon, subsequent encounter: Secondary | ICD-10-CM | POA: Diagnosis not present

## 2021-06-26 DIAGNOSIS — S86091D Other specified injury of right Achilles tendon, subsequent encounter: Secondary | ICD-10-CM | POA: Diagnosis not present

## 2021-06-26 DIAGNOSIS — F332 Major depressive disorder, recurrent severe without psychotic features: Secondary | ICD-10-CM | POA: Diagnosis not present

## 2021-06-26 DIAGNOSIS — F411 Generalized anxiety disorder: Secondary | ICD-10-CM | POA: Diagnosis not present

## 2021-06-28 ENCOUNTER — Encounter: Payer: Self-pay | Admitting: Emergency Medicine

## 2021-06-28 ENCOUNTER — Ambulatory Visit
Admission: EM | Admit: 2021-06-28 | Discharge: 2021-06-28 | Disposition: A | Discharge status: Home / Self Care | Payer: BC Managed Care – PPO | Attending: Internal Medicine | Admitting: Internal Medicine

## 2021-06-28 ENCOUNTER — Other Ambulatory Visit: Payer: Self-pay

## 2021-06-28 DIAGNOSIS — G44319 Acute post-traumatic headache, not intractable: Secondary | ICD-10-CM
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
## 2021-07-03 DIAGNOSIS — S86091D Other specified injury of right Achilles tendon, subsequent encounter: Secondary | ICD-10-CM | POA: Diagnosis not present

## 2021-07-05 DIAGNOSIS — S86091D Other specified injury of right Achilles tendon, subsequent encounter: Secondary | ICD-10-CM | POA: Diagnosis not present

## 2021-07-07 DIAGNOSIS — S86091D Other specified injury of right Achilles tendon, subsequent encounter: Secondary | ICD-10-CM | POA: Diagnosis not present

## 2021-07-10 DIAGNOSIS — F411 Generalized anxiety disorder: Secondary | ICD-10-CM | POA: Diagnosis not present

## 2021-07-10 DIAGNOSIS — F332 Major depressive disorder, recurrent severe without psychotic features: Secondary | ICD-10-CM | POA: Diagnosis not present

## 2021-07-11 DIAGNOSIS — S86091D Other specified injury of right Achilles tendon, subsequent encounter: Secondary | ICD-10-CM | POA: Diagnosis not present

## 2021-07-13 DIAGNOSIS — S86091D Other specified injury of right Achilles tendon, subsequent encounter: Secondary | ICD-10-CM | POA: Diagnosis not present

## 2021-07-17 DIAGNOSIS — F5105 Insomnia due to other mental disorder: Secondary | ICD-10-CM | POA: Diagnosis not present

## 2021-07-17 DIAGNOSIS — F410 Panic disorder [episodic paroxysmal anxiety] without agoraphobia: Secondary | ICD-10-CM | POA: Diagnosis not present

## 2021-07-17 DIAGNOSIS — F321 Major depressive disorder, single episode, moderate: Secondary | ICD-10-CM | POA: Diagnosis not present

## 2021-07-18 DIAGNOSIS — S86091D Other specified injury of right Achilles tendon, subsequent encounter: Secondary | ICD-10-CM | POA: Diagnosis not present

## 2021-07-21 ENCOUNTER — Encounter: Payer: Self-pay | Admitting: Family Medicine

## 2021-07-29 ENCOUNTER — Telehealth: Payer: Self-pay | Admitting: Nurse Practitioner

## 2021-07-29 DIAGNOSIS — E889 Metabolic disorder, unspecified: Secondary | ICD-10-CM

## 2021-07-31 MED ORDER — WEGOVY 0.5 MG/0.5ML ~~LOC~~ SOAJ: 0.5000 mg | SUBCUTANEOUS | 0 refills | Status: DC

## 2021-08-02 ENCOUNTER — Other Ambulatory Visit: Payer: Self-pay | Admitting: Nurse Practitioner

## 2021-08-02 MED ORDER — SEMAGLUTIDE-WEIGHT MANAGEMENT 1 MG/0.5ML ~~LOC~~ SOAJ
1.0000 mg | SUBCUTANEOUS | 0 refills | Status: DC
Start: 2021-09-29 — End: 2021-09-05

## 2021-08-02 NOTE — Telephone Encounter (Signed)
Pt called and stated that she was under the impression that the dosage would go up this time. Pt would like a call back from the nurse. Please advise.  ?

## 2021-08-09 DIAGNOSIS — S86091D Other specified injury of right Achilles tendon, subsequent encounter: Secondary | ICD-10-CM | POA: Diagnosis not present

## 2021-08-11 DIAGNOSIS — S86091D Other specified injury of right Achilles tendon, subsequent encounter: Secondary | ICD-10-CM | POA: Diagnosis not present

## 2021-08-23 NOTE — Progress Notes (Deleted)
Name: Wendy Kent   MRN: [id number]    DOB: [birth year]   Date:08/23/2021       Progress Note  Subjective  Chief Complaint  No chief complaint on file.   HPI  Patient presents for annual CPE.  Diet: *** Exercise: ***   Flowsheet Row Office Visit from 06/08/2021 in [facility]  AUDIT-C Score 0      Depression: Phq 9 is  {Desc; negative/positive:13464}    06/08/2021   11:00 AM 05/16/2021    3:38 PM 01/16/2021   10:06 AM 12/15/2020   10:12 AM 11/17/2020    8:03 AM  Depression screen PHQ 2/9  Decreased Interest 0 0 0 3 2  Down, Depressed, Hopeless 0 0 0 3 2  PHQ - 2 Score 0 0 0 6 4  Altered sleeping 0 0 0 3 2  Tired, decreased energy 0 0 0 3 3  Change in appetite 0 0 0 3 1  Feeling bad or failure about yourself  0 0 0 3 2  Trouble concentrating 0 0 0 3 0  Moving slowly or fidgety/restless 0 0 0 0 0  Suicidal thoughts 0 0 0 0 0  PHQ-9 Score 0 0 0 21 12  Difficult doing work/chores Not difficult at all   Extremely dIfficult    Hypertension: BP Readings from Last 3 Encounters:  06/08/21 128/76  05/16/21 118/80  03/29/21 127/85   Obesity: Wt Readings from Last 3 Encounters:  06/08/21 226 lb 1.6 oz (102.6 kg)  05/16/21 226 lb 3.2 oz (102.6 kg)  01/16/21 226 lb (102.5 kg)   BMI Readings from Last 3 Encounters:  06/08/21 41.35 kg/m  05/16/21 41.37 kg/m  01/16/21 41.34 kg/m     Vaccines:   HPV:  Tdap:  Shingrix:  Pneumonia:  Flu:  COVID-19:   Hep C Screening:  STD testing and prevention (HIV/chl/gon/syphilis):  Intimate partner violence: {Desc; negative/positive:13464} screen  Sexual History : Menstrual History/LMP/Abnormal Bleeding:  Discussed importance of follow up if any post-menopausal bleeding: {Response; yes/no/na:63}  Incontinence Symptoms: {Desc; negative/positive:13464} for symptoms   Breast cancer:  - Last Mammogram: *** - BRCA gene screening:   Osteoporosis Prevention : Discussed high calcium and vitamin D  supplementation, weight bearing exercises Bone density :{Response; yes/no/na:63}   Cervical cancer screening:   Skin cancer: Discussed monitoring for atypical lesions  Colorectal cancer: ***   Lung cancer:  Low Dose CT Chest recommended if Age 41-80 years, 20 pack-year currently smoking OR have quit w/in 15years. Patient {DOES NOT does:27190::"does not"} qualify for screen   ECG: ***  Advanced Care Planning: A voluntary discussion about advance care planning including the explanation and discussion of advance directives.  Discussed health care proxy and Living will, and the patient was able to identify a health care proxy as ***.  Patient {DOES_DOES YOV:78588} have a living will and power of attorney of health care   Lipids: Lab Results  Component Value Date   CHOL 123 11/17/2020   CHOL 139 07/14/2019   CHOL 130 03/06/2017   Lab Results  Component Value Date   HDL 58 11/17/2020   HDL 56 07/14/2019   HDL 62 03/06/2017   Lab Results  Component Value Date   LDLCALC 49 11/17/2020   LDLCALC 66 07/14/2019   LDLCALC 54 03/06/2017   Lab Results  Component Value Date   TRIG 77 11/17/2020   TRIG 85 07/14/2019   TRIG 65 03/06/2017   Lab Results  Component Value Date  CHOLHDL 2.1 11/17/2020   CHOLHDL 2.5 07/14/2019   CHOLHDL 2.1 03/06/2017   No results found for: LDLDIRECT  Glucose: Glucose, Bld  Date Value Ref Range Status  11/17/2020 96 65 - 99 mg/dL Final    Comment:    .            Fasting reference interval .   05/18/2020 89 70 - 99 mg/dL Final    Comment:    Glucose reference range applies only to samples taken after fasting for at least 8 hours.  08/26/2019 85 65 - 99 mg/dL Final    Comment:    .            Fasting reference interval .     Patient Active Problem List   Diagnosis Date Noted   Ankle pain 03/30/2021   Rupture of right Achilles tendon 03/30/2021   Thyroid nodule 11/17/2020   Tortuous aorta ([place]) 05/19/2020   Fatty liver 02/26/2020    Lymphedema 01/21/2019   Varicose veins of leg with pain, bilateral 10/14/2018   Lactose intolerance 09/26/2015   IBS (irritable bowel syndrome) 09/26/2015   Morbid obesity ([place]) 09/28/2014   Hyperthyroidism, subclinical 09/28/2014   Allergic rhinitis, seasonal 09/28/2014   Hypertension goal BP (blood pressure) < 140/90 12/29/2012    Past Surgical History:  Procedure Laterality Date   ABDOMINAL HYSTERECTOMY     patient still has ovaries   BREAST REDUCTION SURGERY Bilateral 08/05/2017   Procedure: BREAST REDUCTION WITH LIPOSUCTION;  Surgeon: Cristine Polio, MD;  Location: [place];  Service: Plastics;  Laterality: Bilateral;   CHOLECYSTECTOMY     COLONOSCOPY WITH PROPOFOL N/A 03/26/2018   Procedure: COLONOSCOPY WITH PROPOFOL;  Surgeon: Jonathon Bellows, MD;  Location[facility] ENDOSCOPY;  Service: Gastroenterology;  Laterality: N/A;   ESOPHAGOGASTRODUODENOSCOPY (EGD) WITH PROPOFOL N/A 03/26/2018   Procedure: ESOPHAGOGASTRODUODENOSCOPY (EGD) WITH PROPOFOL;  Surgeon: Jonathon Bellows, MD;  Location: [facility] ENDOSCOPY;  Service: Gastroenterology;  Laterality: N/A;   GIVENS CAPSULE STUDY N/A 04/14/2018   Procedure: GIVENS CAPSULE STUDY;  Surgeon: Jonathon Bellows, MD;  Location: [facility] ENDOSCOPY;  Service: Gastroenterology;  Laterality: N/A;   REDUCTION MAMMAPLASTY     2019   SHOULDER ARTHROSCOPY Right    TUBAL LIGATION      Family History  Problem Relation Age of Onset   Hyperthyroidism Sister    Alcohol abuse Brother     Social History   Socioeconomic History   Marital status: Single    Spouse name: Not on file   Number of children: 4   Years of education: 12   Highest education level: Associate degree: academic program  Occupational History   Occupation: TEFL teacher    Comment: [employer]  Tobacco Use   Smoking status: Never   Smokeless tobacco: Never  Vaping Use   Vaping Use: Never used  Substance and Sexual Activity   Alcohol use: Yes    Alcohol/week: 0.0 standard  drinks    Comment: socially   Drug use: No   Sexual activity: Not Currently    Birth control/protection: Surgical  Other Topics Concern   Not on file  Social History Narrative   Not on file   Social Determinants of Health   Financial Resource Strain: Not on file  Food Insecurity: Not on file  Transportation Needs: Not on file  Physical Activity: Not on file  Stress: Not on file  Social Connections: Not on file  Intimate Partner Violence: Not on file     Current Outpatient Medications:  atorvastatin (LIPITOR) 10 MG tablet, Take 1 tablet (10 mg total) by mouth daily., Disp: 90 tablet, Rfl: 3   busPIRone (BUSPAR) 15 MG tablet, Take 15 mg by mouth daily as needed., Disp: , Rfl:    citalopram (CELEXA) 40 MG tablet, Take 40 mg by mouth every morning., Disp: , Rfl:    cloNIDine (CATAPRES) 0.1 MG tablet, Take 1 tablet (0.1 mg total) by mouth at bedtime., Disp: 90 tablet, Rfl: 1   fluticasone (FLONASE) 50 MCG/ACT nasal spray, Place 2 sprays into both nostrils daily as needed., Disp: 48 mL, Rfl: 3   hydrochlorothiazide (HYDRODIURIL) 25 MG tablet, Take 1 tablet (25 mg total) by mouth daily., Disp: 90 tablet, Rfl: 1   hydrOXYzine (VISTARIL) 25 MG capsule, Take 1 capsule by mouth 2 (two) times daily as needed., Disp: , Rfl:    [START ON 09/29/2021] Semaglutide-Weight Management 1 MG/0.5ML SOAJ, Inject 1 mg into the skin once a week for 28 days., Disp: 2 mL, Rfl: 0   traMADol (ULTRAM) 50 MG tablet, Take 1 tablet by mouth in the morning, at noon, in the evening, and at bedtime., Disp: , Rfl:    traZODone (DESYREL) 50 MG tablet, Take 100 mg by mouth at bedtime as needed., Disp: , Rfl:   Allergies  Allergen Reactions   Percocet [Oxycodone-Acetaminophen] Anaphylaxis     ROS  ***  Objective  There were no vitals filed for this visit.  There is no height or weight on file to calculate BMI.  Physical Exam ***  No results found for this or any previous visit (from the past 2160  hour(s)).   Fall Risk:    06/08/2021   11:00 AM 05/16/2021    3:26 PM 01/16/2021    9:57 AM 12/15/2020   10:06 AM 11/17/2020    7:55 AM  Fall Risk   Falls in the past year? 0 0 0 0 0  Number falls in past yr: 0  0 0 0  Injury with Fall? 0  0 0 0  Risk for fall due to :   No Fall Risks No Fall Risks   Follow up Falls evaluation completed Falls prevention discussed Falls prevention discussed Falls prevention discussed    ***  Functional Status Survey:   ***  Assessment & Plan  There are no diagnoses linked to this encounter.  -USPSTF grade A and B recommendations reviewed with patient; age-appropriate recommendations, preventive care, screening tests, etc discussed and encouraged; healthy living encouraged; see AVS for patient education given to patient -Discussed importance of 150 minutes of physical activity weekly, eat two servings of fish weekly, eat one serving of tree nuts ( cashews, pistachios, pecans, almonds.Marland Kitchen) every other day, eat 6 servings of fruit/vegetables daily and drink plenty of water and avoid sweet beverages.   -Reviewed Health Maintenance: {yes BV:[id number]}

## 2021-08-24 ENCOUNTER — Encounter: Payer: BC Managed Care – PPO | Admitting: Family Medicine

## 2021-08-24 DIAGNOSIS — Z1231 Encounter for screening mammogram for malignant neoplasm of breast: Secondary | ICD-10-CM

## 2021-08-24 DIAGNOSIS — Z01419 Encounter for gynecological examination (general) (routine) without abnormal findings: Secondary | ICD-10-CM

## 2021-08-24 DIAGNOSIS — S86091D Other specified injury of right Achilles tendon, subsequent encounter: Secondary | ICD-10-CM | POA: Diagnosis not present

## 2021-08-27 ENCOUNTER — Emergency Department: Payer: BC Managed Care – PPO

## 2021-08-27 ENCOUNTER — Emergency Department
Admission: EM | Admit: 2021-08-27 | Discharge: 2021-08-27 | Disposition: A | Discharge status: Home / Self Care | Payer: BC Managed Care – PPO | Attending: Student in an Organized Health Care Education/Training Program | Admitting: Student in an Organized Health Care Education/Training Program

## 2021-08-27 DIAGNOSIS — M79652 Pain in left thigh: Secondary | ICD-10-CM | POA: Diagnosis not present

## 2021-08-27 DIAGNOSIS — R000 Tachycardia, unspecified: Secondary | ICD-10-CM | POA: Diagnosis not present

## 2021-08-27 DIAGNOSIS — R1012 Left upper quadrant pain: Secondary | ICD-10-CM | POA: Diagnosis not present

## 2021-08-27 DIAGNOSIS — R0789 Other chest pain: Secondary | ICD-10-CM

## 2021-08-27 DIAGNOSIS — Y92410 Unspecified street and highway as the place of occurrence of the external cause: Secondary | ICD-10-CM | POA: Insufficient documentation

## 2021-08-27 DIAGNOSIS — N644 Mastodynia: Secondary | ICD-10-CM | POA: Insufficient documentation

## 2021-08-27 DIAGNOSIS — Z79899 Other long term (current) drug therapy: Secondary | ICD-10-CM | POA: Insufficient documentation

## 2021-08-27 DIAGNOSIS — M79622 Pain in left upper arm: Secondary | ICD-10-CM | POA: Insufficient documentation

## 2021-08-27 DIAGNOSIS — I10 Essential (primary) hypertension: Secondary | ICD-10-CM | POA: Insufficient documentation

## 2021-08-27 DIAGNOSIS — S299XXA Unspecified injury of thorax, initial encounter: Secondary | ICD-10-CM | POA: Diagnosis not present

## 2021-08-27 DIAGNOSIS — R079 Chest pain, unspecified: Secondary | ICD-10-CM | POA: Diagnosis not present

## 2021-08-27 DIAGNOSIS — S3991XA Unspecified injury of abdomen, initial encounter: Secondary | ICD-10-CM | POA: Diagnosis not present

## 2021-08-27 NOTE — Discharge Instructions (Addendum)
IMPRESSION:  ?1. No evidence of acute traumatic injury to the chest, abdomen, or  ?pelvis, allowing for limitations secondary to lack of IV contrast.  ?2. Right ovarian cyst measuring 5.6 x 4.8 cm, increased in size from  ?2017 CT. Because this lesion is not adequately characterized, prompt  ?[place] is recommended for further evaluation, this can be performed on  ?an elective basis in the near future. Note: This recommendation does  ?not apply to premenarchal patients and to those with increased risk  ?(genetic, family history, elevated tumor markers or other high-risk  ?factors) of ovarian cancer. Reference: JACR 2020 Feb; 17(2):248-254  ?3. Colonic diverticulosis without diverticulitis.  ? ?
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
## 2021-08-31 DIAGNOSIS — S86091D Other specified injury of right Achilles tendon, subsequent encounter: Secondary | ICD-10-CM | POA: Diagnosis not present

## 2021-09-05 ENCOUNTER — Encounter: Payer: Self-pay | Admitting: Internal Medicine

## 2021-09-05 ENCOUNTER — Ambulatory Visit (INDEPENDENT_AMBULATORY_CARE_PROVIDER_SITE_OTHER): Payer: BC Managed Care – PPO | Admitting: Internal Medicine

## 2021-09-05 VITALS — BP 122/84 | HR 104 | Temp 98.2°F | Resp 16 | Wt 227.0 lb

## 2021-09-05 DIAGNOSIS — Z13220 Encounter for screening for lipoid disorders: Secondary | ICD-10-CM

## 2021-09-05 DIAGNOSIS — E0590 Thyrotoxicosis, unspecified without thyrotoxic crisis or storm: Secondary | ICD-10-CM | POA: Diagnosis not present

## 2021-09-05 DIAGNOSIS — Z1231 Encounter for screening mammogram for malignant neoplasm of breast: Secondary | ICD-10-CM

## 2021-09-05 DIAGNOSIS — Z0001 Encounter for general adult medical examination with abnormal findings: Secondary | ICD-10-CM | POA: Diagnosis not present

## 2021-09-05 DIAGNOSIS — N83201 Unspecified ovarian cyst, right side: Secondary | ICD-10-CM

## 2021-09-05 MED ORDER — WEGOVY 1.7 MG/0.75ML ~~LOC~~ SOAJ: 1.7000 mg | SUBCUTANEOUS | 0 refills | Status: DC

## 2021-09-05 NOTE — Patient Instructions (Addendum)
It was great seeing you today!  Plan discussed at today's visit: -Blood work ordered today, results will be uploaded to [facility].  -Ultrasound ordered to evaluate ovarian cyst please call to schedule at [phone] [facility] increased to 1.7 mg, continue to take weekly and focus on diet and exercise   Follow up in: 1 month   Take care and let us know if you have any questions or concerns prior to your next visit.  Dr. Rosana Berger    Health Maintenance, Female Adopting a healthy lifestyle and getting preventive care are important in promoting health and wellness. Ask your health care provider about: The right schedule for you to have regular tests and exams. Things you can do on your own to prevent diseases and keep yourself healthy. What should I know about diet, weight, and exercise? Eat a healthy diet  Eat a diet that includes plenty of vegetables, fruits, low-fat dairy products, and lean protein. Do not eat a lot of foods that are high in solid fats, added sugars, or sodium. Maintain a healthy weight Body mass index (BMI) is used to identify weight problems. It estimates body fat based on height and weight. Your health care provider can help determine your BMI and help you achieve or maintain a healthy weight. Get regular exercise Get regular exercise. This is one of the most important things you can do for your health. Most adults should: Exercise for at least 150 minutes each week. The exercise should increase your heart rate and make you sweat (moderate-intensity exercise). Do strengthening exercises at least twice a week. This is in addition to the moderate-intensity exercise. Spend less time sitting. Even light physical activity can be beneficial. Watch cholesterol and blood lipids Have your blood tested for lipids and cholesterol at 51 years of age, then have this test every 5 years. Have your cholesterol levels checked more often if: Your lipid or cholesterol levels are  high. You are older than 51 years of age. You are at high risk for heart disease. What should I know about cancer screening? Depending on your health history and family history, you may need to have cancer screening at various ages. This may include screening for: Breast cancer. Cervical cancer. Colorectal cancer. Skin cancer. Lung cancer. What should I know about heart disease, diabetes, and high blood pressure? Blood pressure and heart disease High blood pressure causes heart disease and increases the risk of stroke. This is more likely to develop in people who have high blood pressure readings or are overweight. Have your blood pressure checked: Every 3-5 years if you are 8-27 years of age. Every year if you are 50 years old or older. Diabetes Have regular diabetes screenings. This checks your fasting blood sugar level. Have the screening done: Once every three years after age 77 if you are at a normal weight and have a low risk for diabetes. More often and at a younger age if you are overweight or have a high risk for diabetes. What should I know about preventing infection? Hepatitis B If you have a higher risk for hepatitis B, you should be screened for this virus. Talk with your health care provider to find out if you are at risk for hepatitis B infection. Hepatitis C Testing is recommended for: Everyone born from [birth year] through 1965. Anyone with known risk factors for hepatitis C. Sexually transmitted infections (STIs) Get screened for STIs, including gonorrhea and chlamydia, if: You are sexually active and are younger than 51 years of  age. Dennis Bast are older than 51 years of age and your health care provider tells you that you are at risk for this type of infection. Your sexual activity has changed since you were last screened, and you are at increased risk for chlamydia or gonorrhea. Ask your health care provider if you are at risk. Ask your health care provider about whether you  are at high risk for HIV. Your health care provider may recommend a prescription medicine to help prevent HIV infection. If you choose to take medicine to prevent HIV, you should first get tested for HIV. You should then be tested every 3 months for as long as you are taking the medicine. Pregnancy If you are about to stop having your period (premenopausal) and you may become pregnant, seek counseling before you get pregnant. Take 400 to 800 micrograms (mcg) of folic acid every day if you become pregnant. Ask for birth control (contraception) if you want to prevent pregnancy. Osteoporosis and menopause Osteoporosis is a disease in which the bones lose minerals and strength with aging. This can result in bone fractures. If you are 27 years old or older, or if you are at risk for osteoporosis and fractures, ask your health care provider if you should: Be screened for bone loss. Take a calcium or vitamin D supplement to lower your risk of fractures. Be given hormone replacement therapy (HRT) to treat symptoms of menopause. Follow these instructions at home: Alcohol use Do not drink alcohol if: Your health care provider tells you not to drink. You are pregnant, may be pregnant, or are planning to become pregnant. If you drink alcohol: Limit how much you have to: 0-1 drink a day. Know how much alcohol is in your drink. In the [place] one drink equals one 12 oz bottle of beer (355 mL), one 5 oz glass of wine (148 mL), or one 1 oz glass of hard liquor (44 mL). Lifestyle Do not use any products that contain nicotine or tobacco. These products include cigarettes, chewing tobacco, and vaping devices, such as e-cigarettes. If you need help quitting, ask your health care provider. Do not use street drugs. Do not share needles. Ask your health care provider for help if you need support or information about quitting drugs. General instructions Schedule regular health, dental, and eye exams. Stay current  with your vaccines. Tell your health care provider if: You often feel depressed. You have ever been abused or do not feel safe at home. Summary Adopting a healthy lifestyle and getting preventive care are important in promoting health and wellness. Follow your health care provider's instructions about healthy diet, exercising, and getting tested or screened for diseases. Follow your health care provider's instructions on monitoring your cholesterol and blood pressure. This information is not intended to replace advice given to you by your health care provider. Make sure you discuss any questions you have with your health care provider. Document Revised: 08/29/2020 Document Reviewed: 08/29/2020 Elsevier Patient Education  [place].     Why follow it? Research shows. Those who follow the Mediterranean diet have a reduced risk of heart disease  The diet is associated with a reduced incidence of Parkinson's and Alzheimer's diseases People following the diet may have longer life expectancies and lower rates of chronic diseases  The Dietary Guidelines for Americans recommends the Mediterranean diet as an eating plan to promote health and prevent disease  What Is the Mediterranean Diet?  Healthy eating plan based on typical foods and  recipes of Mediterranean-style cooking The diet is primarily a plant based diet; these foods should make up a majority of meals   Starches - Plant based foods should make up a majority of meals - They are an important sources of vitamins, minerals, energy, antioxidants, and fiber - Choose whole grains, foods high in fiber and minimally processed items  - Typical grain sources include wheat, oats, barley, corn, brown rice, bulgar, farro, millet, polenta, couscous  - Various types of beans include chickpeas, lentils, fava beans, black beans, white beans   Fruits  Veggies - Large quantities of antioxidant rich fruits & veggies; 6 or more servings  -  Vegetables can be eaten raw or lightly drizzled with oil and cooked  - Vegetables common to the traditional Mediterranean Diet include: artichokes, arugula, beets, broccoli, brussel sprouts, cabbage, carrots, celery, collard greens, cucumbers, eggplant, kale, leeks, lemons, lettuce, mushrooms, okra, onions, peas, peppers, potatoes, pumpkin, radishes, rutabaga, shallots, spinach, sweet potatoes, turnips, zucchini - Fruits common to the Mediterranean Diet include: apples, apricots, avocados, cherries, clementines, dates, figs, grapefruits, grapes, melons, nectarines, oranges, peaches, pears, pomegranates, strawberries, tangerines  Fats - Replace butter and margarine with healthy oils, such as olive oil, canola oil, and tahini  - Limit nuts to no more than a handful a day  - Nuts include walnuts, almonds, pecans, pistachios, pine nuts  - Limit or avoid candied, honey roasted or heavily salted nuts - Olives are central to the [employer] - can be eaten whole or used in a variety of dishes   Meats Protein - Limiting red meat: no more than a few times a month - When eating red meat: choose lean cuts and keep the portion to the size of deck of cards - Eggs: approx. 0 to 4 times a week  - Fish and lean poultry: at least 2 a week  - Healthy protein sources include, chicken, [place], lean beef, lamb - Increase intake of seafood such as tuna, salmon, trout, mackerel, shrimp, scallops - Avoid or limit high fat processed meats such as sausage and bacon  Dairy - Include moderate amounts of low fat dairy products  - Focus on healthy dairy such as fat free yogurt, skim milk, low or reduced fat cheese - Limit dairy products higher in fat such as whole or 2% milk, cheese, ice cream  Alcohol - Moderate amounts of red wine is ok  - No more than 5 oz daily for women (all ages) and men older than age 40  - No more than 10 oz of wine daily for men younger than 22  Other - Limit sweets and other desserts  - Use  herbs and spices instead of salt to flavor foods  - Herbs and spices common to the traditional Mediterranean Diet include: basil, bay leaves, chives, cloves, cumin, fennel, garlic, lavender, marjoram, mint, oregano, parsley, pepper, rosemary, sage, savory, sumac, tarragon, thyme   It's not just a diet, it's a lifestyle:  The Mediterranean diet includes lifestyle factors typical of those in the region  Foods, drinks and meals are best eaten with others and savored Daily physical activity is important for overall good health This could be strenuous exercise like running and aerobics This could also be more leisurely activities such as walking, housework, yard-work, or taking the stairs Moderation is the key; a balanced and healthy diet accommodates most foods and drinks Consider portion sizes and frequency of consumption of certain foods   Meal Ideas & Options:  Breakfast:  Whole  wheat toast or whole wheat English muffins with peanut butter & hard boiled egg Steel cut oats topped with apples & cinnamon and skim milk  Fresh fruit: banana, strawberries, melon, berries, peaches  Smoothies: strawberries, bananas, greek yogurt, peanut butter Low fat greek yogurt with blueberries and granola  Egg white omelet with spinach and mushrooms Breakfast couscous: whole wheat couscous, apricots, skim milk, cranberries  Sandwiches:  Hummus and grilled vegetables (peppers, zucchini, squash) on whole wheat bread   Grilled chicken on whole wheat pita with lettuce, tomatoes, cucumbers or tzatziki  [place] salad on whole wheat bread: tuna salad made with greek yogurt, olives, red peppers, capers, green onions Garlic rosemary lamb pita: lamb sauted with garlic, rosemary, salt & pepper; add lettuce, cucumber, greek yogurt to pita - flavor with lemon juice and black pepper  Seafood:  Mediterranean grilled salmon, seasoned with garlic, basil, parsley, lemon juice and black pepper Shrimp, lemon, and spinach  whole-grain pasta salad made with low fat greek yogurt  Seared scallops with lemon orzo  Seared tuna steaks seasoned salt, pepper, coriander topped with tomato mixture of olives, tomatoes, olive oil, minced garlic, parsley, green onions and cappers  Meats:  Herbed greek chicken salad with kalamata olives, cucumber, feta  Red bell peppers stuffed with spinach, bulgur, lean ground beef (or lentils) & topped with feta   Kebabs: skewers of chicken, tomatoes, onions, zucchini, squash  [place] burgers: made with red onions, mint, dill, lemon juice, feta cheese topped with roasted red peppers Vegetarian Cucumber salad: cucumbers, artichoke hearts, celery, red onion, feta cheese, tossed in olive oil & lemon juice  Hummus and whole grain pita points with a greek salad (lettuce, tomato, feta, olives, cucumbers, red onion) Lentil soup with celery, carrots made with vegetable broth, garlic, salt and pepper  Tabouli salad: parsley, bulgur, mint, scallions, cucumbers, tomato, radishes, lemon juice, olive oil, salt and pepper.

## 2021-09-05 NOTE — Progress Notes (Signed)
Name: Wendy Kent   MRN: [id number]    DOB: 08-03-1970   Date:09/05/2021       Progress Note  Subjective  Chief Complaint  Chief Complaint  Patient presents with   Annual Exam    HPI  Patient presents for annual CPE and follow up on Wegovy.  Has been on [facility] about 3 months now. Weight up 1 pound today but she is working on diet. She has not started exercising yet. She states that she is doing well on the medication and overall feels good on the medication and is tolerating it well. She had been on the 1 mg dose and did well over the last 4 weeks. She denies abdominal pain, nausea, vomiting.   She also had a CT of her abdomen on 08/27/21 that incidentally noted a 5.6 x 4.8 cm right ovarian cyst that measures slightly higher than simple fluid density with an increase in size from 2017. Recommendations for a dedicated US for further  evaluation. She had a hysterectomy due to fibroids 4-5 years ago. She denies abdominal pain, bloating. She denies family history of breast or ovarian cancer.   Diet: trying to eat more fruits and vegetables  Exercise: just got a stationary bike, planning to use this soon. Tore Achilles tendon in December    Flowsheet Row Office Visit from 06/08/2021 in [facility]  AUDIT-C Score 0      Depression: Phq 9 is  negative    09/05/2021    1:13 PM 06/08/2021   11:00 AM 05/16/2021    3:38 PM 01/16/2021   10:06 AM 12/15/2020   10:12 AM  Depression screen PHQ 2/9  Decreased Interest 0 0 0 0 3  Down, Depressed, Hopeless 0 0 0 0 3  PHQ - 2 Score 0 0 0 0 6  Altered sleeping 0 0 0 0 3  Tired, decreased energy 0 0 0 0 3  Change in appetite 0 0 0 0 3  Feeling bad or failure about yourself  0 0 0 0 3  Trouble concentrating 0 0 0 0 3  Moving slowly or fidgety/restless 0 0 0 0 0  Suicidal thoughts 0 0 0 0 0  PHQ-9 Score 0 0 0 0 21  Difficult doing work/chores Not difficult at all Not difficult at all   Extremely dIfficult   Hypertension: BP  Readings from Last 3 Encounters:  08/27/21 122/88  06/08/21 128/76  05/16/21 118/80   Obesity: Wt Readings from Last 3 Encounters:  09/05/21 227 lb (103 kg)  08/27/21 200 lb (90.7 kg)  06/08/21 226 lb 1.6 oz (102.6 kg)   BMI Readings from Last 3 Encounters:  09/05/21 41.52 kg/m  08/27/21 36.58 kg/m  06/08/21 41.35 kg/m     Vaccines:   HPV: n/a Tdap: 06/2012 Shingrix: Due  Pneumonia: At age 76  Flu: does not get flu vaccines yearly  COVID-19: has not had    Hep C Screening: 7/22 negative  STD testing and prevention (HIV/chl/gon/syphilis): no concerns  Intimate partner violence: negative screen  Menstrual History/LMP/Abnormal Bleeding: None, had hysterectomy 2014-2015  Discussed importance of follow up if any post-menopausal bleeding: yes  Incontinence Symptoms: negative for symptoms   Breast cancer:  - Last Mammogram: 8/22 Birads-1   Osteoporosis Prevention : Discussed high calcium and vitamin D supplementation, weight bearing exercises Bone density :not applicable   Cervical cancer screening: Hysterectomy   Skin cancer: Discussed monitoring for atypical lesions  Colorectal cancer: Colonoscopy 12/19 - repeat in  5-10 years, minute tubular adenoma on pathology  Lung cancer:  Low Dose CT Chest recommended if Age 52-80 years, 20 pack-year currently smoking OR have quit w/in 15years. Patient does not qualify for screen   ECG: 05/19/20  Advanced Care Planning: A voluntary discussion about advance care planning including the explanation and discussion of advance directives.  Discussed health care proxy and Living will, and the patient was able to identify a health care proxy as Beverly Sessions, son.  Patient does not have a living will and power of attorney of health care   Lipids: Lab Results  Component Value Date   CHOL 123 11/17/2020   CHOL 139 07/14/2019   CHOL 130 03/06/2017   Lab Results  Component Value Date   HDL 58 11/17/2020   HDL 56 07/14/2019   HDL 62  03/06/2017   Lab Results  Component Value Date   LDLCALC 49 11/17/2020   LDLCALC 66 07/14/2019   LDLCALC 54 03/06/2017   Lab Results  Component Value Date   TRIG 77 11/17/2020   TRIG 85 07/14/2019   TRIG 65 03/06/2017   Lab Results  Component Value Date   CHOLHDL 2.1 11/17/2020   CHOLHDL 2.5 07/14/2019   CHOLHDL 2.1 03/06/2017   No results found for: LDLDIRECT  Glucose: Glucose, Bld  Date Value Ref Range Status  11/17/2020 96 65 - 99 mg/dL Final    Comment:    .            Fasting reference interval .   05/18/2020 89 70 - 99 mg/dL Final    Comment:    Glucose reference range applies only to samples taken after fasting for at least 8 hours.  08/26/2019 85 65 - 99 mg/dL Final    Comment:    .            Fasting reference interval .     Patient Active Problem List   Diagnosis Date Noted   Ankle pain 03/30/2021   Rupture of right Achilles tendon 03/30/2021   Thyroid nodule 11/17/2020   Tortuous aorta ([place]) 05/19/2020   Fatty liver 02/26/2020   Lymphedema 01/21/2019   Varicose veins of leg with pain, bilateral 10/14/2018   Lactose intolerance 09/26/2015   IBS (irritable bowel syndrome) 09/26/2015   Morbid obesity ([place]) 09/28/2014   Hyperthyroidism, subclinical 09/28/2014   Allergic rhinitis, seasonal 09/28/2014   Hypertension goal BP (blood pressure) < 140/90 12/29/2012    Past Surgical History:  Procedure Laterality Date   ABDOMINAL HYSTERECTOMY     patient still has ovaries   BREAST REDUCTION SURGERY Bilateral 08/05/2017   Procedure: BREAST REDUCTION WITH LIPOSUCTION;  Surgeon: Cristine Polio, MD;  Location: [place];  Service: Plastics;  Laterality: Bilateral;   CHOLECYSTECTOMY     COLONOSCOPY WITH PROPOFOL N/A 03/26/2018   Procedure: COLONOSCOPY WITH PROPOFOL;  Surgeon: Jonathon Bellows, MD;  [facility] ENDOSCOPY;  Service: Gastroenterology;  Laterality: N/A;   ESOPHAGOGASTRODUODENOSCOPY (EGD) WITH PROPOFOL N/A 03/26/2018    Procedure: ESOPHAGOGASTRODUODENOSCOPY (EGD) WITH PROPOFOL;  Surgeon: Jonathon Bellows, MD;  Location: [facility] ENDOSCOPY;  Service: Gastroenterology;  Laterality: N/A;   GIVENS CAPSULE STUDY N/A 04/14/2018   Procedure: GIVENS CAPSULE STUDY;  Surgeon: Jonathon Bellows, MD;  Location: [facility] ENDOSCOPY;  Service: Gastroenterology;  Laterality: N/A;   REDUCTION MAMMAPLASTY     2019   SHOULDER ARTHROSCOPY Right    TUBAL LIGATION      Family History  Problem Relation Age of Onset   Hyperthyroidism Sister  Alcohol abuse Brother     Social History   Socioeconomic History   Marital status: Single    Spouse name: Not on file   Number of children: 4   Years of education: 12   Highest education level: Associate degree: academic program  Occupational History   Occupation: TEFL teacher    Comment: [employer]  Tobacco Use   Smoking status: Never   Smokeless tobacco: Never  Vaping Use   Vaping Use: Never used  Substance and Sexual Activity   Alcohol use: Yes    Alcohol/week: 0.0 standard drinks    Comment: socially   Drug use: No   Sexual activity: Not Currently    Birth control/protection: Surgical  Other Topics Concern   Not on file  Social History Narrative   Not on file   Social Determinants of Health   Financial Resource Strain: High Risk   Difficulty of Paying Living Expenses: Hard  Food Insecurity: Food Insecurity Present   Worried About [place] in the Last Year: Often true   Ran Out of Food in the Last Year: Often true  Transportation Needs: No Transportation Needs   Lack of Transportation (Medical): No   Lack of Transportation (Non-Medical): No  Physical Activity: Insufficiently Active   Days of Exercise per Week: 2 days   Minutes of Exercise per Session: 40 min  Stress: No Stress Concern Present   Feeling of Stress : Only a little  Social Connections: Moderately Integrated   Frequency of Communication with Friends and Family: More than three times a week    Frequency of Social Gatherings with Friends and Family: Three times a week   Attends Religious Services: 1 to 4 times per year   Active Member of Clubs or Organizations: No   Attends Music therapist: 1 to 4 times per year   Marital Status: Divorced  Human resources officer Violence: Not At Risk   Fear of Current or Ex-Partner: No   Emotionally Abused: No   Physically Abused: No   Sexually Abused: No     Current Outpatient Medications:    atorvastatin (LIPITOR) 10 MG tablet, Take 1 tablet (10 mg total) by mouth daily., Disp: 90 tablet, Rfl: 3   busPIRone (BUSPAR) 15 MG tablet, Take 15 mg by mouth daily as needed., Disp: , Rfl:    cloNIDine (CATAPRES) 0.1 MG tablet, Take 1 tablet (0.1 mg total) by mouth at bedtime., Disp: 90 tablet, Rfl: 1   Semaglutide-Weight Management (WEGOVY) 0.5 MG/0.5ML SOAJ, Wegovy 0.5 mg/0.5 mL subcutaneous pen injector  INJECT 0.5 MG INTO THE SKIN ONCE A WEEK., Disp: , Rfl:    traMADol (ULTRAM) 50 MG tablet, Take 1 tablet by mouth in the morning, at noon, in the evening, and at bedtime., Disp: , Rfl:    hydrochlorothiazide (HYDRODIURIL) 25 MG tablet, Take 1 tablet (25 mg total) by mouth daily., Disp: 90 tablet, Rfl: 1  Allergies  Allergen Reactions   Percocet [Oxycodone-Acetaminophen] Anaphylaxis     Review of Systems  All other systems reviewed and are negative.    Objective  Vitals:   09/05/21 1311  Pulse: (!) 104  Resp: 16  Temp: 98.2 F (36.8 C)  SpO2: 98%  Weight: 227 lb (103 kg)   Filed Weights   09/05/21 1311  Weight: 227 lb (103 kg)     Body mass index is 41.52 kg/m.  Physical Exam Constitutional:      Appearance: Normal appearance.  HENT:     Head:  Normocephalic and atraumatic.     Mouth/Throat:     Mouth: Mucous membranes are moist.     Comments: PND present Eyes:     Conjunctiva/sclera: Conjunctivae normal.  Cardiovascular:     Rate and Rhythm: Normal rate and regular rhythm.  Pulmonary:     Effort: Pulmonary  effort is normal.     Breath sounds: Normal breath sounds.  Musculoskeletal:     Right lower leg: No edema.     Left lower leg: No edema.  Skin:    General: Skin is warm and dry.  Neurological:     General: No focal deficit present.     Mental Status: She is alert. Mental status is at baseline.  Psychiatric:        Mood and Affect: Mood normal.        Behavior: Behavior normal.    No results found for this or any previous visit (from the past 2160 hour(s)).   Fall Risk:    09/05/2021    1:13 PM 06/08/2021   11:00 AM 05/16/2021    3:26 PM 01/16/2021    9:57 AM 12/15/2020   10:06 AM  Fall Risk   Falls in the past year? 0 0 0 0 0  Number falls in past yr: 0 0  0 0  Injury with Fall? 0 0  0 0  Risk for fall due to :    No Fall Risks No Fall Risks  Follow up  Falls evaluation completed Falls prevention discussed Falls prevention discussed Falls prevention discussed     Functional Status Survey: Is the patient deaf or have difficulty hearing?: No Does the patient have difficulty seeing, even when wearing glasses/contacts?: No Does the patient have difficulty concentrating, remembering, or making decisions?: No Does the patient have difficulty walking or climbing stairs?: No Does the patient have difficulty dressing or bathing?: No Does the patient have difficulty doing errands alone such as visiting a doctor's office or shopping?: No   Assessment & Plan  1. Encounter for general adult medical examination with abnormal findings: Due for routine labs including CBC, CMP and recheck A1c.   - CBC w/Diff/Platelet - COMPLETE METABOLIC PANEL WITH GFR - HgB A1c  2. Morbid obesity ([place]): Increase Wegovy to 1.7 mg weekly. Plan to stay at this dose for another month, then increase to 2.4 mg weekly. Discussed diet and exercise, which she will start soon.   - Semaglutide-Weight Management (WEGOVY) 1.7 MG/0.75ML SOAJ; Inject 1.7 mg into the skin once a week.  Dispense: 3 mL; Refill:  0  3. Encounter for screening mammogram for malignant neoplasm of breast: Mammogram due in August, ordered today.   - MM Digital Screening; Future  4. Hyperthyroidism, subclinical: Recheck TSH, TSH 7/22 low at 0.31  - TSH  5. Lipid screening: Screening due today.   - Lipid Profile  6. Cyst of right ovary: Seen on CT chest/abdomen from 08/27/21, dedicated pelvic US for further evaluation ordered today.   - US Pelvic Complete With Transvaginal; Future   -USPSTF grade A and B recommendations reviewed with patient; age-appropriate recommendations, preventive care, screening tests, etc discussed and encouraged; healthy living encouraged; see AVS for patient education given to patient -Discussed importance of 150 minutes of physical activity weekly, eat two servings of fish weekly, eat one serving of tree nuts ( cashews, pistachios, pecans, almonds.Marland Kitchen) every other day, eat 6 servings of fruit/vegetables daily and drink plenty of water and avoid sweet beverages.   -Reviewed Health Maintenance:  Yes.

## 2021-09-06 LAB — CBC WITH DIFFERENTIAL/PLATELET
Absolute Monocytes: 427 cells/uL (ref 200–950)
Basophils Absolute: 27 cells/uL (ref 0–200)
Basophils Relative: 0.3 %
Eosinophils Absolute: 116 cells/uL (ref 15–500)
Eosinophils Relative: 1.3 %
HCT: 39.2 % (ref 35.0–45.0)
Hemoglobin: 12.8 g/dL (ref 11.7–15.5)
Lymphs Abs: 2875 cells/uL (ref 850–3900)
MCH: 28.2 pg (ref 27.0–33.0)
MCHC: 32.7 g/dL (ref 32.0–36.0)
MCV: 86.3 fL (ref 80.0–100.0)
MPV: 9.9 fL (ref 7.5–12.5)
Monocytes Relative: 4.8 %
Neutro Abs: 5456 cells/uL (ref 1500–7800)
Neutrophils Relative %: 61.3 %
Platelets: 371 10*3/uL (ref 140–400)
RBC: 4.54 10*6/uL (ref 3.80–5.10)
RDW: 14.6 % (ref 11.0–15.0)
Total Lymphocyte: 32.3 %
WBC: 8.9 10*3/uL (ref 3.8–10.8)

## 2021-09-06 LAB — COMPLETE METABOLIC PANEL WITH GFR
AG Ratio: 1.2 (calc) (ref 1.0–2.5)
ALT: 11 U/L (ref 6–29)
AST: 12 U/L (ref 10–35)
Albumin: 4.1 g/dL (ref 3.6–5.1)
Alkaline phosphatase (APISO): 71 U/L (ref 37–153)
BUN: 12 mg/dL (ref 7–25)
CO2: 29 mmol/L (ref 20–32)
Calcium: 10.2 mg/dL (ref 8.6–10.4)
Chloride: 104 mmol/L (ref 98–110)
Creat: 0.76 mg/dL (ref 0.50–1.03)
Globulin: 3.3 g/dL (calc) (ref 1.9–3.7)
Glucose, Bld: 85 mg/dL (ref 65–99)
Potassium: 5 mmol/L (ref 3.5–5.3)
Sodium: 144 mmol/L (ref 135–146)
Total Bilirubin: 0.3 mg/dL (ref 0.2–1.2)
Total Protein: 7.4 g/dL (ref 6.1–8.1)
eGFR: 95 mL/min/{1.73_m2} (ref >=60)

## 2021-09-06 LAB — LIPID PANEL
Cholesterol: 112 mg/dL (ref <200)
HDL: 58 mg/dL (ref >=50)
LDL Cholesterol (Calc): 38 mg/dL (calc)
Non-HDL Cholesterol (Calc): 54 mg/dL (calc) (ref <130)
Total CHOL/HDL Ratio: 1.9 (calc) (ref <5.0)
Triglycerides: 84 mg/dL (ref <150)

## 2021-09-06 LAB — TSH: TSH: 0.44 mIU/L

## 2021-09-06 LAB — HEMOGLOBIN A1C
Hgb A1c MFr Bld: 5.2 % of total Hgb (ref <5.7)
Mean Plasma Glucose: 103 mg/dL
eAG (mmol/L): 5.7 mmol/L

## 2021-09-26 ENCOUNTER — Ambulatory Visit: Admission: RE | Admit: 2021-09-26 | Payer: BC Managed Care – PPO | Source: Ambulatory Visit

## 2021-09-27 ENCOUNTER — Telehealth: Payer: Self-pay | Admitting: Family Medicine

## 2021-09-27 DIAGNOSIS — R232 Flushing: Secondary | ICD-10-CM

## 2021-09-27 NOTE — Telephone Encounter (Signed)
Refused Catapres 0.1 mg tablet because it's being requested too early.

## 2021-09-27 NOTE — Telephone Encounter (Signed)
Medication Refill - Medication: cloNIDine (CATAPRES) 0.1 MG tablet  Has the patient contacted their pharmacy? No. No, more refills.  (Agent: If no, request that the patient contact the pharmacy for the refill. If patient does not wish to contact the pharmacy document the reason why and proceed with request.)   Preferred Pharmacy (with phone number or street name):  [id number]- [place], [place] [address] Phone: [phone]: [phone] Hours: Not open 24 hours   Has the patient been seen for an appointment in the last year OR does the patient have an upcoming appointment? Yes.    Agent: Please be advised that RX refills may take up to 3 business days. We ask that you follow-up with your pharmacy.

## 2021-10-03 ENCOUNTER — Encounter: Payer: Self-pay | Admitting: Internal Medicine

## 2021-10-03 ENCOUNTER — Ambulatory Visit (INDEPENDENT_AMBULATORY_CARE_PROVIDER_SITE_OTHER): Payer: BC Managed Care – PPO | Admitting: Internal Medicine

## 2021-10-03 DIAGNOSIS — I10 Essential (primary) hypertension: Secondary | ICD-10-CM | POA: Diagnosis not present

## 2021-10-03 DIAGNOSIS — E889 Metabolic disorder, unspecified: Secondary | ICD-10-CM | POA: Diagnosis not present

## 2021-10-03 DIAGNOSIS — N83201 Unspecified ovarian cyst, right side: Secondary | ICD-10-CM

## 2021-10-03 MED ORDER — HYDROCHLOROTHIAZIDE 25 MG PO TABS: 25.0000 mg | ORAL_TABLET | Freq: Every day | ORAL | 1 refills | Status: DC

## 2021-10-03 MED ORDER — WEGOVY 2.4 MG/0.75ML ~~LOC~~ SOAJ: 2.4000 mg | SUBCUTANEOUS | 0 refills | Status: DC

## 2021-10-03 NOTE — Progress Notes (Signed)
Established Patient Office Visit  Subjective   Patient ID: Wendy Kent, female    DOB: 07-08-1970  Age: 51 y.o. MRN: [id number]  Chief Complaint  Patient presents with   Follow-up   Obesity    HPI Wendy Kent is a 51 year old female presenting for follow-up on weight loss.  Hypertension: -Medications: HCTZ 25 mg, Clonidine 0.1 mg daily  -Patient is compliant with above medications and reports no side effects. -Denies any SOB, CP, vision changes, LE edema or symptoms of hypotension  Obesity: -Has been on Wegovy about 4 months now.  -Lost 3 pounds since last visit -Currently on Wegovy 1.7 mg weekly, tolerating well, she denies abdominal pain, nausea, vomiting.  -Has not implemented calorie restriction or exercise yet, does want to get back to riding stationary bike at home.  Right ovarian cyst: -CT of her abdomen on 08/27/21 that incidentally noted a 5.6 x 4.8 cm right ovarian cyst that measures slightly higher than simple fluid density with an increase in size from 2017. Recommendations for a dedicated US for further  evaluation. She had a hysterectomy due to fibroids 4-5 years ago. She denies abdominal pain, bloating. She denies family history of breast or ovarian cancer. -Pelvic ultrasound ordered at last office visit but not done  HLD: -Medications: Lipitor 10 mg  -Patient is compliant with above medications and reports no side effects.  -Last lipid panel: Lipid Panel     Component Value Date/Time   CHOL 112 09/05/2021 1357   CHOL 120 11/10/2014 0923   TRIG 84 09/05/2021 1357   HDL 58 09/05/2021 1357   HDL 68 11/10/2014 0923   CHOLHDL 1.9 09/05/2021 1357   LDLCALC 38 09/05/2021 1357   LABVLDL 14 11/10/2014 0923    Patient Active Problem List   Diagnosis Date Noted   Ankle pain 03/30/2021   Rupture of right Achilles tendon 03/30/2021   Thyroid nodule 11/17/2020   Tortuous aorta ([place]) 05/19/2020   Fatty liver 02/26/2020   Lymphedema 01/21/2019   Varicose  veins of leg with pain, bilateral 10/14/2018   Lactose intolerance 09/26/2015   IBS (irritable bowel syndrome) 09/26/2015   Morbid obesity ([place]) 09/28/2014   Hyperthyroidism, subclinical 09/28/2014   Allergic rhinitis, seasonal 09/28/2014   Hypertension goal BP (blood pressure) < 140/90 12/29/2012   Past Medical History:  Diagnosis Date   Allergy    Blood transfusion without reported diagnosis    BP (high blood pressure) 12/29/2012   Breast hypertrophy 11/10/2014   GERD (gastroesophageal reflux disease)    Thyroid disease    patient was cleared by specialist. History of Thyroid nodules.   Past Surgical History:  Procedure Laterality Date   ABDOMINAL HYSTERECTOMY     patient still has ovaries   BREAST REDUCTION SURGERY Bilateral 08/05/2017   Procedure: BREAST REDUCTION WITH LIPOSUCTION;  Surgeon: Cristine Polio, MD;  Location: [place];  Service: Plastics;  Laterality: Bilateral;   CHOLECYSTECTOMY     COLONOSCOPY WITH PROPOFOL N/A 03/26/2018   Procedure: COLONOSCOPY WITH PROPOFOL;  Surgeon: Jonathon Bellows, MD;  Location: [facility] ENDOSCOPY;  Service: Gastroenterology;  Laterality: N/A;   ESOPHAGOGASTRODUODENOSCOPY (EGD) WITH PROPOFOL N/A 03/26/2018   Procedure: ESOPHAGOGASTRODUODENOSCOPY (EGD) WITH PROPOFOL;  Surgeon: Jonathon Bellows, MD;  Location: [facility] ENDOSCOPY;  Service: Gastroenterology;  Laterality: N/A;   GIVENS CAPSULE STUDY N/A 04/14/2018   Procedure: GIVENS CAPSULE STUDY;  Surgeon: Jonathon Bellows, MD;  Location: [facility] ENDOSCOPY;  Service: Gastroenterology;  Laterality: N/A;   REDUCTION MAMMAPLASTY     2019  SHOULDER ARTHROSCOPY Right    TUBAL LIGATION     Social History   Tobacco Use   Smoking status: Never   Smokeless tobacco: Never  Vaping Use   Vaping Use: Never used  Substance Use Topics   Alcohol use: Yes    Alcohol/week: 0.0 standard drinks of alcohol    Comment: socially   Drug use: No   Social History   Socioeconomic History   Marital status:  Single    Spouse name: Not on file   Number of children: 4   Years of education: 12   Highest education level: Associate degree: academic program  Occupational History   Occupation: TEFL teacher    Comment: [employer]  Tobacco Use   Smoking status: Never   Smokeless tobacco: Never  Vaping Use   Vaping Use: Never used  Substance and Sexual Activity   Alcohol use: Yes    Alcohol/week: 0.0 standard drinks of alcohol    Comment: socially   Drug use: No   Sexual activity: Not Currently    Birth control/protection: Surgical  Other Topics Concern   Not on file  Social History Narrative   Not on file   Social Determinants of Health   Financial Resource Strain: High Risk (09/05/2021)   Overall Financial Resource Strain (CARDIA)    Difficulty of Paying Living Expenses: Hard  Food Insecurity: Food Insecurity Present (09/05/2021)   Hunger Vital Sign    Worried About Running Out of Food in the Last Year: Often true    Ran Out of Food in the Last Year: Often true  Transportation Needs: No Transportation Needs (09/05/2021)   PRAPARE - Hydrologist (Medical): No    Lack of Transportation (Non-Medical): No  Physical Activity: Insufficiently Active (09/05/2021)   Exercise Vital Sign    Days of Exercise per Week: 2 days    Minutes of Exercise per Session: 40 min  Stress: No Stress Concern Present (09/05/2021)   [place]    Feeling of Stress : Only a little  Social Connections: Moderately Integrated (09/05/2021)   Social Connection and Isolation Panel [NHANES]    Frequency of Communication with Friends and Family: More than three times a week    Frequency of Social Gatherings with Friends and Family: Three times a week    Attends Religious Services: 1 to 4 times per year    Active Member of Clubs or Organizations: No    Attends Archivist Meetings: 1 to 4 times per year    Marital  Status: Divorced  Intimate Partner Violence: Not At Risk (09/05/2021)   Humiliation, Afraid, Rape, and Kick questionnaire    Fear of Current or Ex-Partner: No    Emotionally Abused: No    Physically Abused: No    Sexually Abused: No   Family Status  Relation Name Status   Mother  Deceased       car accident   Father  Alive   MGM  Deceased   MGF  Deceased   PGM  Deceased   PGF  Deceased   Sister  (Not Specified)   Brother  (Not Specified)   Family History  Problem Relation Age of Onset   Hyperthyroidism Sister    Alcohol abuse Brother    Allergies  Allergen Reactions   Percocet [Oxycodone-Acetaminophen] Anaphylaxis      Review of Systems  Constitutional:  Negative for chills and fever.  Eyes:  Negative  for blurred vision.  Respiratory:  Negative for shortness of breath.   Cardiovascular:  Negative for chest pain.  Gastrointestinal:  Negative for abdominal pain, diarrhea, nausea and vomiting.  Neurological:  Negative for dizziness and headaches.      Objective:     BP 124/82   Pulse (!) 115   Temp 98.2 F (36.8 C)   Resp 16   Ht 5' 2"  (1.575 m)   Wt 224 lb 6.4 oz (101.8 kg)   SpO2 98%   BMI 41.04 kg/m  BP Readings from Last 3 Encounters:  10/03/21 124/82  09/05/21 122/84  08/27/21 122/88   Wt Readings from Last 3 Encounters:  10/03/21 224 lb 6.4 oz (101.8 kg)  09/05/21 227 lb (103 kg)  08/27/21 200 lb (90.7 kg)      Physical Exam Constitutional:      Appearance: Normal appearance.  HENT:     Head: Normocephalic and atraumatic.  Eyes:     Conjunctiva/sclera: Conjunctivae normal.  Cardiovascular:     Rate and Rhythm: Normal rate and regular rhythm.  Pulmonary:     Effort: Pulmonary effort is normal.     Breath sounds: Normal breath sounds.  Neurological:     Mental Status: She is alert.  Psychiatric:        Mood and Affect: Mood normal.        Behavior: Behavior normal.      No results found for any visits on 10/03/21.  Last CBC Lab  Results  Component Value Date   WBC 8.9 09/05/2021   HGB 12.8 09/05/2021   HCT 39.2 09/05/2021   MCV 86.3 09/05/2021   MCH 28.2 09/05/2021   RDW 14.6 09/05/2021   PLT 371 84/66/5993   Last metabolic panel Lab Results  Component Value Date   GLUCOSE 85 09/05/2021   NA 144 09/05/2021   K 5.0 09/05/2021   CL 104 09/05/2021   CO2 29 09/05/2021   BUN 12 09/05/2021   CREATININE 0.76 09/05/2021   EGFR 95 09/05/2021   CALCIUM 10.2 09/05/2021   PROT 7.4 09/05/2021   ALBUMIN 3.7 12/22/2015   LABGLOB 3.1 11/10/2014   AGRATIO 1.4 11/10/2014   BILITOT 0.3 09/05/2021   ALKPHOS 59 12/22/2015   AST 12 09/05/2021   ALT 11 09/05/2021   ANIONGAP 9 05/18/2020   Last lipids Lab Results  Component Value Date   CHOL 112 09/05/2021   HDL 58 09/05/2021   LDLCALC 38 09/05/2021   TRIG 84 09/05/2021   CHOLHDL 1.9 09/05/2021   Last hemoglobin A1c Lab Results  Component Value Date   HGBA1C 5.2 09/05/2021   Last thyroid functions Lab Results  Component Value Date   TSH 0.44 09/05/2021   T4TOTAL 9.6 01/14/2019   Last vitamin D No results found for: "25OHVITD2", "25OHVITD3", "VD25OH" Last vitamin B12 and Folate Lab Results  Component Value Date   VITAMINB12 >2000 (H) 03/17/2018   FOLATE 10.0 03/17/2018      The ASCVD Risk score (Arnett DK, et al., 2019) failed to calculate for the following reasons:   The valid total cholesterol range is 130 to 320 mg/dL    Assessment & Plan:   1. Morbid obesity (HCC)/Metabolic disorder: Lost 3 pounds since last time in 1 month. Increase Wegovy to 2.4 mg weekly, however patient is not doing any lifestyle modifications. Discussed calorie restriction and starting 150 minutes of cardiovascular exercise a week to maximize weight loss. Continue Lipitor 10 mg. Follow up in 1 month to decide  maintenance dose.   - Semaglutide-Weight Management (WEGOVY) 2.4 MG/0.75ML SOAJ; Inject 2.4 mg into the skin once a week.  Dispense: 3 mL; Refill: 0  2. Cyst of  right ovary: Reorder pelvic US.   - US Pelvic Complete With Transvaginal; Future  3. Hypertension goal BP (blood pressure) < 140/90: Stable, continue current regimen of HCTZ 25 mg, Clonidine 0.1 mg daily. Refills today.   - hydrochlorothiazide (HYDRODIURIL) 25 MG tablet; Take 1 tablet (25 mg total) by mouth daily.  Dispense: 90 tablet; Refill: 1  Return in about 4 weeks (around 10/31/2021).    Teodora Medici, DO

## 2021-10-11 ENCOUNTER — Ambulatory Visit
Admission: RE | Admit: 2021-10-11 | Discharge: 2021-10-11 | Disposition: A | Discharge status: Home / Self Care | Payer: BC Managed Care – PPO | Source: Ambulatory Visit | Attending: Internal Medicine | Admitting: Internal Medicine

## 2021-10-11 DIAGNOSIS — N83201 Unspecified ovarian cyst, right side: Secondary | ICD-10-CM | POA: Diagnosis not present

## 2021-10-11 DIAGNOSIS — Z90710 Acquired absence of both cervix and uterus: Secondary | ICD-10-CM | POA: Diagnosis not present

## 2021-10-20 DIAGNOSIS — R2689 Other abnormalities of gait and mobility: Secondary | ICD-10-CM | POA: Diagnosis not present

## 2021-10-20 DIAGNOSIS — M6281 Muscle weakness (generalized): Secondary | ICD-10-CM | POA: Diagnosis not present

## 2021-10-20 DIAGNOSIS — S86091D Other specified injury of right Achilles tendon, subsequent encounter: Secondary | ICD-10-CM | POA: Diagnosis not present

## 2021-10-23 DIAGNOSIS — S86091D Other specified injury of right Achilles tendon, subsequent encounter: Secondary | ICD-10-CM | POA: Diagnosis not present

## 2021-11-02 ENCOUNTER — Other Ambulatory Visit: Payer: Self-pay | Admitting: Internal Medicine

## 2021-11-02 MED ORDER — WEGOVY 2.4 MG/0.75ML ~~LOC~~ SOAJ: 2.4000 mg | SUBCUTANEOUS | 0 refills | Status: DC

## 2021-11-13 ENCOUNTER — Ambulatory Visit: Payer: BC Managed Care – PPO | Admitting: Family Medicine

## 2021-11-27 ENCOUNTER — Other Ambulatory Visit: Payer: Self-pay | Admitting: Internal Medicine

## 2021-11-27 MED ORDER — WEGOVY 2.4 MG/0.75ML ~~LOC~~ SOAJ: 2.4000 mg | SUBCUTANEOUS | 0 refills | Status: DC
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
## 2021-12-15 ENCOUNTER — Ambulatory Visit (INDEPENDENT_AMBULATORY_CARE_PROVIDER_SITE_OTHER): Payer: BC Managed Care – PPO | Admitting: Family Medicine

## 2021-12-15 ENCOUNTER — Encounter: Payer: Self-pay | Admitting: Family Medicine

## 2021-12-15 VITALS — BP 118/72 | HR 90 | Resp 16 | Ht 62.0 in | Wt 218.0 lb

## 2021-12-15 DIAGNOSIS — I771 Stricture of artery: Secondary | ICD-10-CM

## 2021-12-15 DIAGNOSIS — N83291 Other ovarian cyst, right side: Secondary | ICD-10-CM | POA: Diagnosis not present

## 2021-12-15 DIAGNOSIS — I10 Essential (primary) hypertension: Secondary | ICD-10-CM

## 2021-12-15 DIAGNOSIS — R232 Flushing: Secondary | ICD-10-CM

## 2021-12-15 MED ORDER — WEGOVY 2.4 MG/0.75ML ~~LOC~~ SOAJ: 2.4000 mg | SUBCUTANEOUS | 0 refills | Status: DC

## 2021-12-15 MED ORDER — CLONIDINE HCL 0.1 MG PO TABS: 0.1000 mg | ORAL_TABLET | Freq: Every day | ORAL | 1 refills | Status: DC

## 2021-12-15 MED ORDER — ATORVASTATIN CALCIUM 10 MG PO TABS: 10.0000 mg | ORAL_TABLET | Freq: Every day | ORAL | 3 refills | Status: DC

## 2021-12-15 MED ORDER — HYDROCHLOROTHIAZIDE 25 MG PO TABS: 25.0000 mg | ORAL_TABLET | Freq: Every day | ORAL | 1 refills | Status: DC

## 2022-01-11 DIAGNOSIS — Z6839 Body mass index (BMI) 39.0-39.9, adult: Secondary | ICD-10-CM | POA: Diagnosis not present

## 2022-01-11 DIAGNOSIS — Z03818 Encounter for observation for suspected exposure to other biological agents ruled out: Secondary | ICD-10-CM | POA: Diagnosis not present

## 2022-01-11 DIAGNOSIS — J029 Acute pharyngitis, unspecified: Secondary | ICD-10-CM | POA: Diagnosis not present

## 2022-01-16 ENCOUNTER — Encounter: Payer: Self-pay | Admitting: Obstetrics & Gynecology

## 2022-01-16 ENCOUNTER — Ambulatory Visit (INDEPENDENT_AMBULATORY_CARE_PROVIDER_SITE_OTHER): Payer: BC Managed Care – PPO | Admitting: Obstetrics & Gynecology

## 2022-01-16 VITALS — BP 120/80 | Ht 62.0 in | Wt 216.0 lb

## 2022-01-16 DIAGNOSIS — N83201 Unspecified ovarian cyst, right side: Secondary | ICD-10-CM
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
## 2022-01-17 LAB — CA 125: Cancer Antigen (CA) 125: 10.7 U/mL (ref 0.0–38.1)

## 2022-02-08 ENCOUNTER — Ambulatory Visit
Admission: RE | Admit: 2022-02-08 | Discharge: 2022-02-08 | Disposition: A | Discharge status: Home / Self Care | Payer: BC Managed Care – PPO | Source: Ambulatory Visit | Attending: Obstetrics & Gynecology | Admitting: Obstetrics & Gynecology

## 2022-02-08 DIAGNOSIS — N83201 Unspecified ovarian cyst, right side: Secondary | ICD-10-CM | POA: Diagnosis present

## 2022-02-08 DIAGNOSIS — Z90710 Acquired absence of both cervix and uterus: Secondary | ICD-10-CM | POA: Diagnosis not present

## 2022-02-08 DIAGNOSIS — R1900 Intra-abdominal and pelvic swelling, mass and lump, unspecified site: Secondary | ICD-10-CM | POA: Diagnosis not present

## 2022-02-19 ENCOUNTER — Encounter (INDEPENDENT_AMBULATORY_CARE_PROVIDER_SITE_OTHER): Payer: Self-pay
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
## 2022-03-14 ENCOUNTER — Ambulatory Visit (INDEPENDENT_AMBULATORY_CARE_PROVIDER_SITE_OTHER): Payer: BC Managed Care – PPO | Admitting: Family Medicine

## 2022-03-14 ENCOUNTER — Encounter: Payer: Self-pay | Admitting: Family Medicine

## 2022-03-14 VITALS — BP 114/70 | HR 91 | Resp 16 | Ht 62.0 in | Wt 211.0 lb

## 2022-03-14 DIAGNOSIS — I771 Stricture of artery: Secondary | ICD-10-CM | POA: Diagnosis not present

## 2022-03-14 DIAGNOSIS — E889 Metabolic disorder, unspecified: Secondary | ICD-10-CM | POA: Diagnosis not present

## 2022-03-14 DIAGNOSIS — E042 Nontoxic multinodular goiter: Secondary | ICD-10-CM

## 2022-03-14 DIAGNOSIS — Z1231 Encounter for screening mammogram for malignant neoplasm of breast: Secondary | ICD-10-CM | POA: Diagnosis not present

## 2022-03-14 DIAGNOSIS — E0590 Thyrotoxicosis, unspecified without thyrotoxic crisis or storm: Secondary | ICD-10-CM

## 2022-03-14 MED ORDER — WEGOVY 2.4 MG/0.75ML ~~LOC~~ SOAJ: 2.4000 mg | SUBCUTANEOUS | 1 refills | Status: DC

## 2022-03-31 ENCOUNTER — Encounter: Payer: Self-pay | Admitting: Family Medicine

## 2022-04-04 ENCOUNTER — Ambulatory Visit (INDEPENDENT_AMBULATORY_CARE_PROVIDER_SITE_OTHER): Payer: BC Managed Care – PPO | Admitting: Family Medicine

## 2022-04-04 ENCOUNTER — Encounter: Payer: Self-pay | Admitting: Family Medicine

## 2022-04-04 VITALS — BP 118/78 | HR 94 | Temp 98.1°F | Resp 16 | Ht 62.0 in | Wt 210.9 lb

## 2022-04-04 DIAGNOSIS — M79672 Pain in left foot: Secondary | ICD-10-CM | POA: Diagnosis not present

## 2022-04-04 MED ORDER — MELOXICAM 15 MG PO TABS: 7.5000 mg | ORAL_TABLET | Freq: Every day | ORAL | 1 refills | Status: DC

## 2022-04-04 NOTE — Progress Notes (Signed)
Patient ID: Wendy Kent, female    DOB: 11/27/1970, 51 y.o.   MRN: [id number]  PCP: Steele Sizer, MD  Chief Complaint  Patient presents with   Pain    Left foot started this weekend, denies injuring it, hurts for movement on/off    Subjective:   Wendy Kent is a 52 y.o. female, presents to clinic with CC of the following:  HPI  Left lateral dorsal foot pain onset Sunday no unusual activity Sat Did televisit was told suspected gout given steroid -today she has no pain, she notes on Sunday having gradual onset of pain, there was no redness, swelling but she could not bear weight She did have a Achilles tendon surgery and has mostly been at home for the past year doing remote work and in the past couple weeks she is returned to work she remembers wearing high heels the other day which did cause some discomfort and she does have to go up and down stairs, no other instances of strain, injury or long distance walking/jogging, no prior injuries, she cannot think of any foods that she ate that could have triggered anything.  Patient Active Problem List   Diagnosis Date Noted   Right ovarian cyst 01/16/2022   Ankle pain 03/30/2021   Rupture of right Achilles tendon 03/30/2021   Thyroid nodule 11/17/2020   Tortuous aorta ([place]) 05/19/2020   Fatty liver 02/26/2020   Lymphedema 01/21/2019   Varicose veins of leg with pain, bilateral 10/14/2018   Lactose intolerance 09/26/2015   IBS (irritable bowel syndrome) 09/26/2015   Morbid obesity ([place]) 09/28/2014   Hyperthyroidism, subclinical 09/28/2014   Allergic rhinitis, seasonal 09/28/2014   Hypertension goal BP (blood pressure) < 140/90 12/29/2012      Current Outpatient Medications:    atorvastatin (LIPITOR) 10 MG tablet, Take 1 tablet (10 mg total) by mouth daily., Disp: 90 tablet, Rfl: 3   cloNIDine (CATAPRES) 0.1 MG tablet, Take 1 tablet (0.1 mg total) by mouth at bedtime., Disp: 90 tablet, Rfl: 1   hydrochlorothiazide  (HYDRODIURIL) 25 MG tablet, Take 1 tablet (25 mg total) by mouth daily., Disp: 90 tablet, Rfl: 1   Semaglutide-Weight Management (WEGOVY) 2.4 MG/0.75ML SOAJ, Inject 2.4 mg into the skin once a week., Disp: 9 mL, Rfl: 1   Allergies  Allergen Reactions   Percocet [Oxycodone-Acetaminophen] Anaphylaxis     Social History   Tobacco Use   Smoking status: Never   Smokeless tobacco: Never  Vaping Use   Vaping Use: Never used  Substance Use Topics   Alcohol use: Yes    Alcohol/week: 0.0 standard drinks of alcohol    Comment: socially   Drug use: No      Chart Review Today: I personally reviewed active problem list, medication list, allergies, family history, social history, health maintenance, notes from last encounter, lab results, imaging with the patient/caregiver today.   Review of Systems  Constitutional: Negative.   HENT: Negative.    Eyes: Negative.   Respiratory: Negative.    Cardiovascular: Negative.   Gastrointestinal: Negative.   Endocrine: Negative.   Genitourinary: Negative.   Musculoskeletal: Negative.   Skin: Negative.   Allergic/Immunologic: Negative.   Neurological: Negative.   Hematological: Negative.   Psychiatric/Behavioral: Negative.    All other systems reviewed and are negative.      Objective:   Vitals:   04/04/22 1351  BP: 118/78  Pulse: 94  Resp: 16  Temp: 98.1 F (36.7 C)  TempSrc: Oral  SpO2: 99%  Weight: 210 lb 14.4 oz (95.7 kg)  Height: '5\' 2"'$  (1.575 m)    Body mass index is 38.57 kg/m.  Physical Exam Vitals and nursing note reviewed.  Constitutional:      General: She is not in acute distress.    Appearance: Normal appearance. She is well-developed. She is obese. She is not ill-appearing, toxic-appearing or diaphoretic.  HENT:     Head: Normocephalic and atraumatic.     Nose: Nose normal.  Eyes:     General:        Right eye: No discharge.        Left eye: No discharge.     Conjunctiva/sclera: Conjunctivae normal.   Neck:     Trachea: No tracheal deviation.  Cardiovascular:     Rate and Rhythm: Normal rate and regular rhythm.  Pulmonary:     Effort: Pulmonary effort is normal. No respiratory distress.     Breath sounds: No stridor.  Musculoskeletal:        General: Normal range of motion.     Left ankle: Normal. Normal range of motion.     Left Achilles Tendon: Normal.     Left foot: Normal. Normal range of motion and normal capillary refill. No swelling, deformity, laceration, tenderness, bony tenderness or crepitus. Normal pulse.  Skin:    General: Skin is warm and dry.     Findings: No rash.  Neurological:     Mental Status: She is alert.     Motor: No abnormal muscle tone.     Coordination: Coordination normal.  Psychiatric:        Behavior: Behavior normal.      Results for orders placed or performed in visit on 01/16/22  CA 125  Result Value Ref Range   Cancer Antigen (CA) 125 10.7 0.0 - 38.1 U/mL       Assessment & Plan:     ICD-10-CM   1. Left foot pain  M79.672 Uric acid    COMPLETE METABOLIC PANEL WITH GFR    DG Foot Complete Left    meloxicam (MOBIC) 15 MG tablet     Patient had left dorsal lateral foot pain upon waking Sunday with no strain or injury prior she did a virtual visit was told that she probably has gout, she denies any redness swelling hyperalgesia to the skin, but her symptoms did improve with taking the prednisone  After extensive discussion today in clinic she did recall using high heels 1 day last week at work and she member some discomfort, some inflammation could have been caused from that, today her exam is completely normal I did order labs and imaging to further evaluate the suggested diagnosis of gout which I do believe is unlikely, or x-ray if she has any recurrence of her pain.  At the end of the visit today the patient was very certain that walking up and down stairs at work and wearing high heels was likely the cause and she declined to do any  lab work which I agree with  She will complete the x-ray and follow-up if she has any recurrence of her pain     Delsa Grana, PA-C 04/04/22 2:39 PM

## 2022-04-04 NOTE — Patient Instructions (Signed)
Low-Purine Eating Plan A low-purine eating plan involves making food choices to limit your purine intake. Purine is a kind of uric acid. Too much uric acid in your blood can cause certain conditions, such as gout and kidney stones. Eating a low-purine diet may help control these conditions. What are tips for following this plan? Shopping Avoid buying products that contain high-fructose corn syrup. Check for this on food labels. It is commonly found in many processed foods and soft drinks. Be sure to check for it in baked goods such as cookies, canned fruits, and cereals and cereal bars. Avoid buying veal, chicken breast with skin, lamb, and organ meats such as liver. These types of meats tend to have the highest purine content. Choose dairy products. These may lower uric acid levels. Avoid certain types of fish. Not all fish and seafood have high purine content. Examples with high purine content include anchovies, trout, tuna, sardines, and salmon. Avoid buying beverages that contain alcohol, particularly beer and hard liquor. Alcohol can affect the way your body gets rid of uric acid. Meal planning  Learn which foods do or do not affect you. If you find out that a food tends to cause your gout symptoms to flare up, avoid eating that food. You can enjoy foods that do not cause problems. If you have any questions about a food item, talk with your dietitian or health care provider. Reduce the overall amount of meat in your diet. When you do eat meat, choose ones with lower purine content. Include plenty of fruits and vegetables. Although some vegetables may have a high purine content--such as asparagus, mushrooms, spinach, or cauliflower--it has been shown that these do not contribute to uric acid blood levels as much. Consume at least 1 dairy serving a day. This has been shown to decrease uric acid levels. General information If you drink alcohol: Limit how much you have to: 0-1 drink a day for  women who are not pregnant. 0-2 drinks a day for men. Know how much alcohol is in a drink. In the [place] one drink equals one 12 oz bottle of beer (355 mL), one 5 oz glass of wine (148 mL), or one 1 oz glass of hard liquor (44 mL). Drink plenty of water. Try to drink enough to keep your urine pale yellow. Fluids can help remove uric acid from your body. Work with your health care provider and dietitian to develop a plan to achieve or maintain a healthy weight. Losing weight may help reduce uric acid in your blood. What foods are recommended? The following are some types of foods that are good choices when limiting purine intake: Fresh or frozen fruits and vegetables. Whole grains, breads, cereals, and pasta. Rice. Beans, peas, legumes. Nuts and seeds. Dairy products. Fats and oils. The items listed above may not be a complete list. Talk with a dietitian about what dietary choices are best for you. What foods are not recommended? Limit your intake of foods high in purines, including: Beer and other alcohol. Meat-based gravy or sauce. Canned or fresh fish, such as: Anchovies, sardines, herring, salmon, and tuna. Mussels and scallops. Codfish, trout, and haddock. Bacon, veal, chicken breast with skin, and lamb. Organ meats, such as: Liver or kidney. Tripe. Sweetbreads (thymus gland or pancreas). Wild game or goose. Yeast or yeast extract supplements. Drinks sweetened with high-fructose corn syrup, such as soda. Processed foods made with high-fructose corn syrup. The items listed above may not be a complete list of foods   and beverages you should limit. Contact a dietitian for more information. Summary Eating a low-purine diet may help control conditions caused by too much uric acid in the body, such as gout or kidney stones. Choose low-purine foods, limit alcohol, and limit high-fructose corn syrup. You will learn over time which foods do or do not affect you. If you find out that a  food tends to cause your gout symptoms to flare up, avoid eating that food. This information is not intended to replace advice given to you by your health care provider. Make sure you discuss any questions you have with your health care provider. Document Revised: 03/23/2021 Document Reviewed: 03/23/2021 Elsevier Patient Education  2023 Elsevier Inc.  

## 2022-05-03 ENCOUNTER — Emergency Department (HOSPITAL_BASED_OUTPATIENT_CLINIC_OR_DEPARTMENT_OTHER): Payer: BC Managed Care – PPO

## 2022-05-03 ENCOUNTER — Emergency Department (HOSPITAL_BASED_OUTPATIENT_CLINIC_OR_DEPARTMENT_OTHER)
Admission: EM | Admit: 2022-05-03 | Discharge: 2022-05-03 | Disposition: A | Discharge status: Home / Self Care | Payer: BC Managed Care – PPO | Attending: Emergency Medicine | Admitting: Emergency Medicine

## 2022-05-03 ENCOUNTER — Encounter (HOSPITAL_BASED_OUTPATIENT_CLINIC_OR_DEPARTMENT_OTHER): Payer: Self-pay

## 2022-05-03 DIAGNOSIS — R079 Chest pain, unspecified: Secondary | ICD-10-CM | POA: Diagnosis not present

## 2022-05-03 DIAGNOSIS — M546 Pain in thoracic spine: Secondary | ICD-10-CM | POA: Diagnosis not present

## 2022-05-03 DIAGNOSIS — R457 State of emotional shock and stress, unspecified: Secondary | ICD-10-CM | POA: Diagnosis not present

## 2022-05-03 DIAGNOSIS — I10 Essential (primary) hypertension: Secondary | ICD-10-CM | POA: Diagnosis not present

## 2022-05-03 DIAGNOSIS — Y92410 Unspecified street and highway as the place of occurrence of the external cause: Secondary | ICD-10-CM | POA: Diagnosis not present

## 2022-05-03 DIAGNOSIS — M19011 Primary osteoarthritis, right shoulder: Secondary | ICD-10-CM

## 2022-05-03 DIAGNOSIS — M25511 Pain in right shoulder: Secondary | ICD-10-CM | POA: Diagnosis not present

## 2022-05-03 HISTORY — DX: Essential (primary) hypertension: I10

## 2022-05-03 MED ORDER — KETOROLAC TROMETHAMINE 30 MG/ML IJ SOLN
30.0000 mg | Freq: Once | INTRAMUSCULAR | Status: AC
  Administered 2022-05-03: 30 mg via INTRAMUSCULAR
  Filled 2022-05-03: qty 1

## 2022-05-03 NOTE — ED Notes (Signed)
Patient transported to X-ray 

## 2022-05-03 NOTE — ED Notes (Signed)
Pt reports to desk feeling better. States that she is better and is ready to go home. Pt to be discharged. EDP aware
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
## 2022-05-03 NOTE — Discharge Instructions (Addendum)
Use Tylenol every 4 hours and ibuprofen every 6 hours needed for pain in addition to ice.  Your x-ray showed that the bones are okay and no broken bones.  This is muscle strain.  You can try heat as well and massage as tolerated.  Gradually increase range of motion as tolerated.  Return for new concerns and follow-up with your primary doctor as needed.  Work note provided.

## 2022-05-03 NOTE — ED Notes (Signed)
Pt getting dressed and began feeling dizzy. Pt sitting in chair. Family sitting with pt. EDP aware and pt was given drink and some crackers. EDP infomed pt to sit for a while and to take her time. Will reevaluate.

## 2022-05-04 DIAGNOSIS — M546 Pain in thoracic spine: Secondary | ICD-10-CM | POA: Diagnosis not present

## 2022-05-14 DIAGNOSIS — M546 Pain in thoracic spine: Secondary | ICD-10-CM | POA: Diagnosis not present

## 2022-05-30 ENCOUNTER — Other Ambulatory Visit: Payer: Self-pay | Admitting: Family Medicine

## 2022-05-30 DIAGNOSIS — M79672 Pain in left foot: Secondary | ICD-10-CM

## 2022-06-04 ENCOUNTER — Other Ambulatory Visit: Payer: Self-pay | Admitting: Obstetrics & Gynecology

## 2022-06-04 ENCOUNTER — Encounter: Payer: Self-pay | Admitting: Obstetrics & Gynecology

## 2022-06-04 DIAGNOSIS — N83201 Unspecified ovarian cyst, right side: Secondary | ICD-10-CM

## 2022-06-04 NOTE — Progress Notes (Signed)
Ultrasound in 6 months ordered to follow up right ovarian cyst.

## 2022-06-07 ENCOUNTER — Ambulatory Visit: Admission: RE | Admit: 2022-06-07 | Payer: BC Managed Care – PPO | Source: Ambulatory Visit

## 2022-06-12 DIAGNOSIS — M5459 Other low back pain: Secondary | ICD-10-CM | POA: Diagnosis not present

## 2022-06-12 DIAGNOSIS — M542 Cervicalgia: Secondary | ICD-10-CM | POA: Diagnosis not present

## 2022-06-15 DIAGNOSIS — M542 Cervicalgia: Secondary | ICD-10-CM | POA: Diagnosis not present

## 2022-06-15 DIAGNOSIS — M5459 Other low back pain: Secondary | ICD-10-CM | POA: Diagnosis not present

## 2022-06-19 DIAGNOSIS — M5459 Other low back pain: Secondary | ICD-10-CM | POA: Diagnosis not present

## 2022-06-19 DIAGNOSIS — M542 Cervicalgia: Secondary | ICD-10-CM | POA: Diagnosis not present

## 2022-06-22 DIAGNOSIS — M542 Cervicalgia: Secondary | ICD-10-CM | POA: Diagnosis not present

## 2022-06-22 DIAGNOSIS — M5459 Other low back pain: Secondary | ICD-10-CM | POA: Diagnosis not present

## 2022-06-26 ENCOUNTER — Other Ambulatory Visit: Payer: Self-pay | Admitting: Nurse Practitioner

## 2022-06-26 ENCOUNTER — Other Ambulatory Visit: Payer: Self-pay | Admitting: Internal Medicine

## 2022-06-26 DIAGNOSIS — M545 Low back pain, unspecified: Secondary | ICD-10-CM | POA: Diagnosis not present

## 2022-06-26 DIAGNOSIS — E889 Metabolic disorder, unspecified: Secondary | ICD-10-CM

## 2022-06-26 DIAGNOSIS — M25551 Pain in right hip: Secondary | ICD-10-CM | POA: Diagnosis not present

## 2022-06-26 NOTE — Telephone Encounter (Signed)
Unable to refill per protocol, Rx expired. Discontinued.  Requested Prescriptions  Pending Prescriptions Disp Refills   WEGOVY 0.5 MG/0.5ML SOAJ [Pharmacy Med Name: WEGOVY 0.5 MG/0.5 ML PEN]      Sig: INJECT 0.5 MG INTO THE SKIN ONCE A WEEK.     Endocrinology:  Diabetes - GLP-1 Receptor Agonists - semaglutide Failed - 06/26/2022  7:59 AM      Failed - HBA1C in normal range and within 180 days    Hgb A1c MFr Bld  Date Value Ref Range Status  09/05/2021 5.2 <5.7 % of total Hgb Final    Comment:    For the purpose of screening for the presence of diabetes: . <5.7%       Consistent with the absence of diabetes 5.7-6.4%    Consistent with increased risk for diabetes             (prediabetes) > or =6.5%  Consistent with diabetes . This assay result is consistent with a decreased risk of diabetes. . Currently, no consensus exists regarding use of hemoglobin A1c for diagnosis of diabetes in children. . According to American Diabetes Association (ADA) guidelines, hemoglobin A1c <7.0% represents optimal control in non-pregnant diabetic patients. Different metrics may apply to specific patient populations.  Standards of Medical Care in Diabetes(ADA). .          Passed - Cr in normal range and within 360 days    Creat  Date Value Ref Range Status  09/05/2021 0.76 0.50 - 1.03 mg/dL Final         Passed - Valid encounter within last 6 months    Recent Outpatient Visits           2 months ago Left foot pain   [facility] Delsa Grana, PA-C   3 months ago Tortuous aorta [facility])   [facility] Steele Sizer, MD   6 months ago Complex cyst of right ovary   [facility] Steele Sizer, MD   8 months ago Morbid obesity [facility])   [facility] Teodora Medici, DO   9 months ago Encounter for general adult medical examination with abnormal findings   [facility] Teodora Medici, DO       Future Appointments             In 2 months Steele Sizer, MD [facility] 1 MG/0.5ML Darden Palmer [Pharmacy Med Name: WEGOVY 1 MG/0.5 ML PEN]      Sig: INJECT 1 MG INTO THE SKIN ONCE A WEEK FOR 28 DAYS.     Endocrinology:  Diabetes - GLP-1 Receptor Agonists - semaglutide Failed - 06/26/2022  7:59 AM      Failed - HBA1C in normal range and within 180 days    Hgb A1c MFr Bld  Date Value Ref Range Status  09/05/2021 5.2 <5.7 % of total Hgb Final    Comment:    For the purpose of screening for the presence of diabetes: . <5.7%       Consistent with the absence of diabetes 5.7-6.4%    Consistent with increased risk for diabetes             (prediabetes) > or =6.5%  Consistent with diabetes . This assay result is consistent with a decreased risk of diabetes. . Currently, no consensus exists regarding use  of hemoglobin A1c for diagnosis of diabetes in children. . According to American Diabetes Association (ADA) guidelines, hemoglobin A1c <7.0% represents optimal control in non-pregnant diabetic patients. Different metrics may apply to specific patient populations.  Standards of Medical Care in Diabetes(ADA). .          Passed - Cr in normal range and within 360 days    Creat  Date Value Ref Range Status  09/05/2021 0.76 0.50 - 1.03 mg/dL Final         Passed - Valid encounter within last 6 months    Recent Outpatient Visits           2 months ago Left foot pain   [facility] Delsa Grana, PA-C   3 months ago Tortuous aorta [facility])   [facility] Steele Sizer, MD   6 months ago Complex cyst of right ovary   [facility] Steele Sizer, MD   8 months ago Morbid obesity [facility])   [place], DO   9 months ago Encounter for general adult medical  examination with abnormal findings   [facility] [address] Teodora Medici, DO       Future Appointments             In 2 months Steele Sizer, MD [facility], [facility]

## 2022-06-26 NOTE — Telephone Encounter (Signed)
Unable to refill per protocol, Rx expired. Discontinued 10/03/21.  Requested Prescriptions  Pending Prescriptions Disp Refills   WEGOVY 1.7 MG/0.75ML SOAJ [Pharmacy Med Name: WEGOVY 1.7 MG/0.75 ML PEN]      Sig: INJECT 1.7 MG INTO THE SKIN ONCE A WEEK.     Endocrinology:  Diabetes - GLP-1 Receptor Agonists - semaglutide Failed - 06/26/2022  7:59 AM      Failed - HBA1C in normal range and within 180 days    Hgb A1c MFr Bld  Date Value Ref Range Status  09/05/2021 5.2 <5.7 % of total Hgb Final    Comment:    For the purpose of screening for the presence of diabetes: . <5.7%       Consistent with the absence of diabetes 5.7-6.4%    Consistent with increased risk for diabetes             (prediabetes) > or =6.5%  Consistent with diabetes . This assay result is consistent with a decreased risk of diabetes. . Currently, no consensus exists regarding use of hemoglobin A1c for diagnosis of diabetes in children. . According to American Diabetes Association (ADA) guidelines, hemoglobin A1c <7.0% represents optimal control in non-pregnant diabetic patients. Different metrics may apply to specific patient populations.  Standards of Medical Care in Diabetes(ADA). .          Passed - Cr in normal range and within 360 days    Creat  Date Value Ref Range Status  09/05/2021 0.76 0.50 - 1.03 mg/dL Final         Passed - Valid encounter within last 6 months    Recent Outpatient Visits           2 months ago Left foot pain   [facility] Delsa Grana, PA-C   3 months ago Tortuous aorta [facility])   [facility] Steele Sizer, MD   6 months ago Complex cyst of right ovary   [facility] Steele Sizer, MD   8 months ago Morbid obesity [facility])   [place], DO   9 months ago Encounter for general adult medical examination with abnormal findings   [facility] Teodora Medici, DO       Future Appointments             In 2 months Steele Sizer, MD [facility], [facility]

## 2022-06-27 DIAGNOSIS — M5459 Other low back pain: Secondary | ICD-10-CM | POA: Diagnosis not present

## 2022-06-27 DIAGNOSIS — M542 Cervicalgia: Secondary | ICD-10-CM | POA: Diagnosis not present

## 2022-06-28 DIAGNOSIS — M5459 Other low back pain: Secondary | ICD-10-CM | POA: Diagnosis not present

## 2022-06-28 DIAGNOSIS — M542 Cervicalgia: Secondary | ICD-10-CM | POA: Diagnosis not present

## 2022-07-03 DIAGNOSIS — M542 Cervicalgia: Secondary | ICD-10-CM | POA: Diagnosis not present

## 2022-07-03 DIAGNOSIS — M5459 Other low back pain: Secondary | ICD-10-CM | POA: Diagnosis not present

## 2022-07-05 DIAGNOSIS — M542 Cervicalgia: Secondary | ICD-10-CM | POA: Diagnosis not present

## 2022-07-05 DIAGNOSIS — M5459 Other low back pain: Secondary | ICD-10-CM | POA: Diagnosis not present

## 2022-07-10 DIAGNOSIS — M5459 Other low back pain: Secondary | ICD-10-CM | POA: Diagnosis not present

## 2022-07-10 DIAGNOSIS — M542 Cervicalgia: Secondary | ICD-10-CM | POA: Diagnosis not present

## 2022-07-24 DIAGNOSIS — M542 Cervicalgia: Secondary | ICD-10-CM | POA: Diagnosis not present

## 2022-07-24 DIAGNOSIS — M5459 Other low back pain: Secondary | ICD-10-CM | POA: Diagnosis not present

## 2022-07-26 DIAGNOSIS — M5459 Other low back pain: Secondary | ICD-10-CM | POA: Diagnosis not present

## 2022-07-26 DIAGNOSIS — M542 Cervicalgia: Secondary | ICD-10-CM | POA: Diagnosis not present

## 2022-07-31 DIAGNOSIS — M542 Cervicalgia: Secondary | ICD-10-CM | POA: Diagnosis not present

## 2022-07-31 DIAGNOSIS — M5459 Other low back pain: Secondary | ICD-10-CM | POA: Diagnosis not present

## 2022-08-05 ENCOUNTER — Encounter: Payer: Self-pay | Admitting: Family Medicine

## 2022-08-06 ENCOUNTER — Other Ambulatory Visit: Payer: Self-pay

## 2022-08-06 MED ORDER — FLUTICASONE PROPIONATE 50 MCG/ACT NA SUSP: 2.0000 | Freq: Every day | NASAL | 0 refills | Status: DC

## 2022-08-06 NOTE — Telephone Encounter (Signed)
Sent per Dr Carlynn Purl.

## 2022-08-07 DIAGNOSIS — M5412 Radiculopathy, cervical region: Secondary | ICD-10-CM | POA: Diagnosis not present

## 2022-08-07 DIAGNOSIS — M542 Cervicalgia: Secondary | ICD-10-CM | POA: Diagnosis not present

## 2022-08-14 DIAGNOSIS — M542 Cervicalgia: Secondary | ICD-10-CM | POA: Diagnosis not present

## 2022-08-14 DIAGNOSIS — M5459 Other low back pain: Secondary | ICD-10-CM | POA: Diagnosis not present

## 2022-08-16 DIAGNOSIS — M542 Cervicalgia: Secondary | ICD-10-CM | POA: Diagnosis not present

## 2022-08-16 DIAGNOSIS — M5459 Other low back pain: Secondary | ICD-10-CM | POA: Diagnosis not present

## 2022-08-21 DIAGNOSIS — M5459 Other low back pain: Secondary | ICD-10-CM | POA: Diagnosis not present

## 2022-08-21 DIAGNOSIS — M542 Cervicalgia: Secondary | ICD-10-CM | POA: Diagnosis not present

## 2022-08-28 DIAGNOSIS — M542 Cervicalgia: Secondary | ICD-10-CM | POA: Diagnosis not present

## 2022-08-28 DIAGNOSIS — M5459 Other low back pain: Secondary | ICD-10-CM | POA: Diagnosis not present

## 2022-08-29 ENCOUNTER — Other Ambulatory Visit: Payer: Self-pay | Admitting: Family Medicine

## 2022-08-30 DIAGNOSIS — M542 Cervicalgia: Secondary | ICD-10-CM | POA: Diagnosis not present

## 2022-08-30 DIAGNOSIS — M5459 Other low back pain: Secondary | ICD-10-CM | POA: Diagnosis not present

## 2022-09-06 DIAGNOSIS — M542 Cervicalgia: Secondary | ICD-10-CM | POA: Diagnosis not present

## 2022-09-06 DIAGNOSIS — M5459 Other low back pain: Secondary | ICD-10-CM | POA: Diagnosis not present

## 2022-09-11 DIAGNOSIS — M542 Cervicalgia: Secondary | ICD-10-CM | POA: Diagnosis not present

## 2022-09-11 DIAGNOSIS — M5459 Other low back pain: Secondary | ICD-10-CM | POA: Diagnosis not present
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
## 2022-09-12 ENCOUNTER — Encounter: Payer: Self-pay | Admitting: Family Medicine

## 2022-09-12 ENCOUNTER — Ambulatory Visit: Payer: BC Managed Care – PPO | Admitting: Family Medicine

## 2022-09-12 VITALS — BP 116/80 | HR 92 | Resp 16 | Ht 62.0 in | Wt 206.0 lb

## 2022-09-12 DIAGNOSIS — I10 Essential (primary) hypertension: Secondary | ICD-10-CM

## 2022-09-12 DIAGNOSIS — I771 Stricture of artery: Secondary | ICD-10-CM

## 2022-09-12 DIAGNOSIS — N83291 Other ovarian cyst, right side: Secondary | ICD-10-CM

## 2022-09-12 DIAGNOSIS — E889 Metabolic disorder, unspecified: Secondary | ICD-10-CM | POA: Diagnosis not present

## 2022-09-12 DIAGNOSIS — F341 Dysthymic disorder: Secondary | ICD-10-CM

## 2022-09-12 DIAGNOSIS — E0590 Thyrotoxicosis, unspecified without thyrotoxic crisis or storm: Secondary | ICD-10-CM

## 2022-09-12 DIAGNOSIS — Z23 Encounter for immunization: Secondary | ICD-10-CM | POA: Diagnosis not present

## 2022-09-12 DIAGNOSIS — E042 Nontoxic multinodular goiter: Secondary | ICD-10-CM

## 2022-09-12 LAB — CBC WITH DIFFERENTIAL/PLATELET
Absolute Monocytes: 362 cells/uL (ref 200–950)
Basophils Absolute: 31 cells/uL (ref 0–200)
MCH: 28.8 pg (ref 27.0–33.0)
MCHC: 33 g/dL (ref 32.0–36.0)
RBC: 4.34 10*6/uL (ref 3.80–5.10)
Total Lymphocyte: 32 %
WBC: 7.7 10*3/uL (ref 3.8–10.8)

## 2022-09-12 MED ORDER — WEGOVY 2.4 MG/0.75ML ~~LOC~~ SOAJ: 2.4000 mg | SUBCUTANEOUS | 1 refills | Status: DC

## 2022-09-12 MED ORDER — HYDROCHLOROTHIAZIDE 25 MG PO TABS: 25.0000 mg | ORAL_TABLET | Freq: Every day | ORAL | 1 refills | Status: DC

## 2022-09-12 NOTE — Patient Instructions (Addendum)
[phone] Dr. Nicholaus Bloom Please call to find out when you need to have your pelvic US   [address]  [place], [place] [address]  [phone]  Erlene Quan, MD  [address]  [place], [place] [address]  [phone] (Work)  [phone] (Fax)

## 2022-09-13 LAB — COMPLETE METABOLIC PANEL WITH GFR
AG Ratio: 1.3 (calc) (ref 1.0–2.5)
ALT: 17 U/L (ref 6–29)
AST: 17 U/L (ref 10–35)
Albumin: 4 g/dL (ref 3.6–5.1)
Alkaline phosphatase (APISO): 83 U/L (ref 37–153)
BUN: 12 mg/dL (ref 7–25)
CO2: 30 mmol/L (ref 20–32)
Calcium: 10.2 mg/dL (ref 8.6–10.4)
Chloride: 104 mmol/L (ref 98–110)
Creat: 0.65 mg/dL (ref 0.50–1.03)
Globulin: 3.1 g/dL (calc) (ref 1.9–3.7)
Glucose, Bld: 95 mg/dL (ref 65–99)
Potassium: 4.5 mmol/L (ref 3.5–5.3)
Sodium: 145 mmol/L (ref 135–146)
Total Bilirubin: 0.4 mg/dL (ref 0.2–1.2)
Total Protein: 7.1 g/dL (ref 6.1–8.1)
eGFR: 106 mL/min/{1.73_m2} (ref >=60)

## 2022-09-13 LAB — CBC WITH DIFFERENTIAL/PLATELET
Basophils Relative: 0.4 %
Eosinophils Absolute: 131 cells/uL (ref 15–500)
Eosinophils Relative: 1.7 %
HCT: 37.9 % (ref 35.0–45.0)
Hemoglobin: 12.5 g/dL (ref 11.7–15.5)
Lymphs Abs: 2464 cells/uL (ref 850–3900)
MCV: 87.3 fL (ref 80.0–100.0)
MPV: 10.3 fL (ref 7.5–12.5)
Monocytes Relative: 4.7 %
Neutro Abs: 4712 cells/uL (ref 1500–7800)
Neutrophils Relative %: 61.2 %
Platelets: 345 10*3/uL (ref 140–400)
RDW: 14.4 % (ref 11.0–15.0)

## 2022-09-13 LAB — HEMOGLOBIN A1C
Hgb A1c MFr Bld: 5.4 % of total Hgb (ref <5.7)
Mean Plasma Glucose: 108 mg/dL
eAG (mmol/L): 6 mmol/L

## 2022-09-13 LAB — LIPID PANEL
Cholesterol: 95 mg/dL (ref <200)
HDL: 62 mg/dL (ref >=50)
LDL Cholesterol (Calc): 20 mg/dL (calc)
Non-HDL Cholesterol (Calc): 33 mg/dL (calc) (ref <130)
Total CHOL/HDL Ratio: 1.5 (calc) (ref <5.0)
Triglycerides: 45 mg/dL (ref <150)

## 2022-09-13 LAB — TSH: TSH: 0.38 mIU/L — ABNORMAL LOW

## 2022-09-18 DIAGNOSIS — M542 Cervicalgia: Secondary | ICD-10-CM | POA: Diagnosis not present

## 2022-11-17 ENCOUNTER — Other Ambulatory Visit: Payer: Self-pay | Admitting: Family Medicine

## 2022-11-19 MED ORDER — FLUTICASONE PROPIONATE 50 MCG/ACT NA SUSP: 2.0000 | Freq: Every day | NASAL | 0 refills | Status: DC

## 2022-11-23 ENCOUNTER — Other Ambulatory Visit: Payer: Self-pay | Admitting: Family Medicine

## 2022-12-24 ENCOUNTER — Other Ambulatory Visit: Payer: Self-pay | Admitting: Family Medicine

## 2022-12-24 DIAGNOSIS — I771 Stricture of artery: Secondary | ICD-10-CM

## 2022-12-26 ENCOUNTER — Other Ambulatory Visit: Payer: Self-pay | Admitting: Family Medicine

## 2022-12-27 MED ORDER — FLUTICASONE PROPIONATE 50 MCG/ACT NA SUSP: 2.0000 | Freq: Every day | NASAL | 0 refills | Status: DC

## 2023-01-02 ENCOUNTER — Telehealth: Payer: Self-pay | Admitting: Family Medicine

## 2023-01-02 NOTE — Telephone Encounter (Signed)
Called patient and advised contacting her insurance to see what is covered. Patient verbalized understanding.

## 2023-01-02 NOTE — Telephone Encounter (Signed)
Pt states that she received a letter stating that her insurance will no longer cover her medication: Semaglutide-Weight Management (WEGOVY) 2.4 MG/0.75ML SOAJ    Pt is wanting to see what her PCP is going to do regarding the medication and if she need to be put on something else. Please call pt back to discuss.

## 2023-01-22 ENCOUNTER — Other Ambulatory Visit: Payer: Self-pay | Admitting: Family Medicine

## 2023-01-22 DIAGNOSIS — M79672 Pain in left foot: Secondary | ICD-10-CM

## 2023-01-22 MED ORDER — MELOXICAM 15 MG PO TABS
15.0000 mg | ORAL_TABLET | Freq: Every day | ORAL | 0 refills | Status: DC
Start: 2023-01-22 — End: 2023-07-23

## 2023-01-24 NOTE — Progress Notes (Signed)
Established Patient Office Visit  Subjective   Patient ID: Wendy Kent, female    DOB: [birth year]  Age: 52 y.o. MRN: [id number]  Chief Complaint  Patient presents with   Follow-up   Medication Refill   Obesity    HPI  Patient is here today for follow up on chronic medical conditions and medication refills. She is new to me and was last seen in the office in May 2024.  Hypertension/tortuous aorta: -Medications: hydrochlorothiazide 25 mg -Patient is compliant with above medications and reports no side effects. -Denies any SOB, CP, vision changes, LE edema or symptoms of hypotension  HLD: -Medications: Lipitor 10 mg,  -Patient is compliant with above medications and reports no side effects.  -Last lipid panel: Lipid Panel     Component Value Date/Time   CHOL 95 09/12/2022 0901   CHOL 120 11/10/2014 0923   TRIG 45 09/12/2022 0901   HDL 62 09/12/2022 0901   HDL 68 11/10/2014 0923   CHOLHDL 1.5 09/12/2022 0901   LDLCALC 20 09/12/2022 0901   LABVLDL 14 11/10/2014 0923   Obesity/Weight Management:  -Had been on Wegovy 2.4 mg but plateaued with her weight loss - last injection was about 10 days ago -Last weight in May was 206, today 217 -Patient states she is working on diet, increasing hydration but still eating sweet treats every once in awhile -Last A1c 5/24 5.4%  Health Maintenance: -Blood work UTD   Patient Active Problem List   Diagnosis Date Noted   Right ovarian cyst 01/16/2022   Ankle pain 03/30/2021   Rupture of right Achilles tendon 03/30/2021   Thyroid nodule 11/17/2020   Tortuous aorta (HCC) 05/19/2020   Fatty liver 02/26/2020   Lymphedema 01/21/2019   Varicose veins of leg with pain, bilateral 10/14/2018   Lactose intolerance 09/26/2015   IBS (irritable bowel syndrome) 09/26/2015   Morbid obesity (HCC) 09/28/2014   Hyperthyroidism, subclinical 09/28/2014   Allergic rhinitis, seasonal 09/28/2014   Hypertension goal BP (blood pressure) < 140/90  12/29/2012   Past Medical History:  Diagnosis Date   Allergy    Blood transfusion without reported diagnosis    BP (high blood pressure) 12/29/2012   Breast hypertrophy 11/10/2014   GERD (gastroesophageal reflux disease)    Hypertension    Thyroid disease    patient was cleared by specialist. History of Thyroid nodules.   Past Surgical History:  Procedure Laterality Date   ABDOMINAL HYSTERECTOMY     patient still has ovaries   BREAST REDUCTION SURGERY Bilateral 08/05/2017   Procedure: BREAST REDUCTION WITH LIPOSUCTION;  Surgeon: Louisa Second, MD;  Location: [facility];  Service: Plastics;  Laterality: Bilateral;   CHOLECYSTECTOMY     COLONOSCOPY WITH PROPOFOL N/A 03/26/2018   Procedure: COLONOSCOPY WITH PROPOFOL;  Surgeon: Wyline Mood, MD;  Location: [facility] END[facility]COPY;  Service: Gastroenterology;  Laterality: N/A;   ESOPHAGOGASTRODUODENOSCOPY (EGD) WITH PROPOFOL N/A 03/26/2018   Procedure: ESOPHAGOGASTRODUODENOSCOPY (EGD) WITH PROPOFOL;  Surgeon: Wyline Mood, MD;  Location: [facility] ENDOSCOPY;  Service: Gastroenterology;  Laterality: N/A;   GIVENS CAPSULE STUDY N/A 04/14/2018   Procedure: GIVENS CAPSULE STUDY;  Surgeon: Wyline Mood, MD;  Location: [facility] ENDOSCOPY;  Service: Gastroenterology;  Laterality: N/A;   REDUCTION MAMMAPLASTY     2019   SHOULDER ARTHROSCOPY Right    TUBAL LIGATION     Social History   Tobacco Use   Smoking status: Never   Smokeless tobacco: Never  Vaping Use   Vaping status: Never Used  Substance Use  Topics   Alcohol use: Yes    Alcohol/week: 0.0 standard drinks of alcohol    Comment: socially   Drug use: No   Social History   Socioeconomic History   Marital status: Married    Spouse name: Not on file   Number of children: 4   Years of education: 12   Highest education level: Associate degree: academic program  Occupational History   Occupation: Research officer, trade union    Comment: [employer]  Tobacco Use   Smoking status: Never    Smokeless tobacco: Never  Vaping Use   Vaping status: Never Used  Substance and Sexual Activity   Alcohol use: Yes    Alcohol/week: 0.0 standard drinks of alcohol    Comment: socially   Drug use: No   Sexual activity: Not Currently    Birth control/protection: Surgical  Other Topics Concern   Not on file  Social History Narrative   ** Merged History Encounter **       Social Determinants of Health   Financial Resource Strain: High Risk (09/05/2021)   Overall Financial Resource Strain (CARDIA)    Difficulty of Paying Living Expenses: Hard  Food Insecurity: Food Insecurity Present (09/05/2021)   Hunger Vital Sign    Worried About Running Out of Food in the Last Year: Often true    Ran Out of Food in the Last Year: Often true  Transportation Needs: No Transportation Needs (09/05/2021)   PRAPARE - Administrator, Civil Service (Medical): No    Lack of Transportation (Non-Medical): No  Physical Activity: Insufficiently Active (09/05/2021)   Exercise Vital Sign    Days of Exercise per Week: 2 days    Minutes of Exercise per Session: 40 min  Stress: No Stress Concern Present (09/05/2021)   [employer] of Occupational Health - Occupational Stress Questionnaire    Feeling of Stress : Only a little  Social Connections: Moderately Integrated (09/05/2021)   Social Connection and Isolation Panel [NHANES]    Frequency of Communication with Friends and Family: More than three times a week    Frequency of Social Gatherings with Friends and Family: Three times a week    Attends Religious Services: 1 to 4 times per year    Active Member of Clubs or Organizations: No    Attends Banker Meetings: 1 to 4 times per year    Marital Status: Divorced  Intimate Partner Violence: Not At Risk (09/05/2021)   Humiliation, Afraid, Rape, and Kick questionnaire    Fear of Current or Ex-Partner: No    Emotionally Abused: No    Physically Abused: No    Sexually Abused: No    Family Status  Relation Name Status   Mother  Deceased       car accident   Father  Alive   MGM  Deceased   MGF  Deceased   PGM  Deceased   PGF  Deceased   Sister  (Not Specified)   Brother  (Not Specified)  No partnership data on file   Family History  Problem Relation Age of Onset   Hyperthyroidism Sister    Alcohol abuse Brother    Allergies  Allergen Reactions   Percocet [Oxycodone-Acetaminophen] Anaphylaxis   Percocet [Oxycodone-Acetaminophen] Anaphylaxis    Difficulty breathing       Review of Systems  Constitutional:  Negative for chills and fever.  Eyes:  Negative for blurred vision.  Respiratory:  Negative for shortness of breath.   Cardiovascular:  Negative for  chest pain.  Gastrointestinal:  Negative for abdominal pain, constipation, diarrhea, heartburn, nausea and vomiting.  Neurological:  Negative for headaches.      Objective:     BP 124/78   Pulse 89   Temp 98.5 F (36.9 C)   Resp 18   Ht 5\' 2"  (1.575 m)   Wt 217 lb (98.4 kg)   SpO2 98%   BMI 39.69 kg/m  BP Readings from Last 3 Encounters:  01/25/23 124/78  09/12/22 116/80  05/03/22 (!) 134/100   Wt Readings from Last 3 Encounters:  01/25/23 217 lb (98.4 kg)  09/12/22 206 lb (93.4 kg)  05/03/22 208 lb (94.3 kg)      Physical Exam Constitutional:      Appearance: Normal appearance. She is obese.  HENT:     Head: Normocephalic and atraumatic.  Eyes:     Conjunctiva/sclera: Conjunctivae normal.  Cardiovascular:     Rate and Rhythm: Normal rate and regular rhythm.  Pulmonary:     Effort: Pulmonary effort is normal.     Breath sounds: Normal breath sounds.  Skin:    General: Skin is warm and dry.  Neurological:     General: No focal deficit present.     Mental Status: She is alert. Mental status is at baseline.  Psychiatric:        Mood and Affect: Mood normal.        Behavior: Behavior normal.      No results found for any visits on 01/25/23.  Last CBC Lab Results   Component Value Date   WBC 7.7 09/12/2022   HGB 12.5 09/12/2022   HCT 37.9 09/12/2022   MCV 87.3 09/12/2022   MCH 28.8 09/12/2022   RDW 14.4 09/12/2022   PLT 345 09/12/2022   Last metabolic panel Lab Results  Component Value Date   GLUCOSE 95 09/12/2022   NA 145 09/12/2022   K 4.5 09/12/2022   CL 104 09/12/2022   CO2 30 09/12/2022   BUN 12 09/12/2022   CREATININE 0.65 09/12/2022   EGFR 106 09/12/2022   CALCIUM 10.2 09/12/2022   PROT 7.1 09/12/2022   ALBUMIN 3.7 12/22/2015   LABGLOB 3.1 11/10/2014   AGRATIO 1.4 11/10/2014   BILITOT 0.4 09/12/2022   ALKPHOS 59 12/22/2015   AST 17 09/12/2022   ALT 17 09/12/2022   ANIONGAP 9 05/18/2020   Last lipids Lab Results  Component Value Date   CHOL 95 09/12/2022   HDL 62 09/12/2022   LDLCALC 20 09/12/2022   TRIG 45 09/12/2022   CHOLHDL 1.5 09/12/2022   Last hemoglobin A1c Lab Results  Component Value Date   HGBA1C 5.4 09/12/2022   Last thyroid functions Lab Results  Component Value Date   TSH 0.38 (L) 09/12/2022   T4TOTAL 9.6 01/14/2019   Last vitamin D No results found for: "25OHVITD2", "25OHVITD3", "VD25OH" Last vitamin B12 and Folate Lab Results  Component Value Date   VITAMINB12 >2000 (H) 03/17/2018   FOLATE 10.0 03/17/2018      The ASCVD Risk score (Arnett DK, et al., 2019) failed to calculate for the following reasons:   The valid total cholesterol range is 130 to 320 mg/dL    Assessment & Plan:   1. Morbid obesity (HCC)/Metabolic disorder: No longer successful with Wegovy, patient also received notice from her insurance that this may not be covered going forward. Discussed switching over the Zepbound to help with further weight loss. Discussed savings card through the drug company if not covered by  insurance. Discussed potential side effects. Will call to schedule after she does her first dose for 4 weeks afterward to discuss potentially increasing the dose.   - tirzepatide (ZEPBOUND) 2.5 MG/0.5ML Pen;  Inject 2.5 mg into the skin once a week.  Dispense: 2 mL; Refill: 0  2. Hypertension goal BP (blood pressure) < 140/90: Blood pressure stable, will refill medication.   - hydrochlorothiazide (HYDRODIURIL) 25 MG tablet; Take 1 tablet (25 mg total) by mouth daily.  Dispense: 90 tablet; Refill: 1   Return if symptoms worsen or fail to improve, for patient will call to schedule 3-4 weeks after starting medication.    Margarita Mail, DO

## 2023-01-25 ENCOUNTER — Encounter: Payer: Self-pay | Admitting: Internal Medicine

## 2023-01-25 ENCOUNTER — Ambulatory Visit: Payer: BC Managed Care – PPO | Admitting: Internal Medicine

## 2023-01-25 DIAGNOSIS — E889 Metabolic disorder, unspecified: Secondary | ICD-10-CM

## 2023-01-25 DIAGNOSIS — I10 Essential (primary) hypertension: Secondary | ICD-10-CM

## 2023-01-25 MED ORDER — ZEPBOUND 2.5 MG/0.5ML ~~LOC~~ SOAJ
2.5000 mg | SUBCUTANEOUS | 0 refills | Status: DC
Start: 2023-01-25 — End: 2023-02-28

## 2023-01-25 MED ORDER — HYDROCHLOROTHIAZIDE 25 MG PO TABS
25.0000 mg | ORAL_TABLET | Freq: Every day | ORAL | 1 refills | Status: DC
Start: 2023-01-25 — End: 2023-07-23

## 2023-02-21 ENCOUNTER — Other Ambulatory Visit: Payer: Self-pay | Admitting: Internal Medicine

## 2023-02-21 DIAGNOSIS — E889 Metabolic disorder, unspecified: Secondary | ICD-10-CM

## 2023-02-22 ENCOUNTER — Other Ambulatory Visit: Payer: Self-pay | Admitting: Family Medicine

## 2023-02-22 DIAGNOSIS — I771 Stricture of artery: Secondary | ICD-10-CM

## 2023-02-28 ENCOUNTER — Other Ambulatory Visit: Payer: Self-pay

## 2023-02-28 ENCOUNTER — Ambulatory Visit: Payer: BC Managed Care – PPO | Admitting: Nurse Practitioner

## 2023-02-28 ENCOUNTER — Encounter: Payer: Self-pay | Admitting: Nurse Practitioner

## 2023-02-28 DIAGNOSIS — Z1211 Encounter for screening for malignant neoplasm of colon: Secondary | ICD-10-CM | POA: Diagnosis not present

## 2023-02-28 MED ORDER — ZEPBOUND 5 MG/0.5ML ~~LOC~~ SOAJ: 5.0000 mg | SUBCUTANEOUS | 0 refills | Status: DC

## 2023-02-28 NOTE — Progress Notes (Signed)
BP 126/84   Pulse 98   Temp 98.1 F (36.7 C) (Oral)   Resp 16   Ht 5\' 2"  (1.575 m)   Wt 213 lb 6.4 oz (96.8 kg)   SpO2 99%   BMI 39.03 kg/m    Subjective:    Patient ID: Wendy Kent, female    DOB: 08/29/1970, 52 y.o.   MRN: [id number]  HPI: Wendy Kent is a 52 y.o. female  Chief Complaint  Patient presents with   Obesity    Refills on Zepbound   Follow-up   Hyperlipidemia    Discuss lab work from job   Obesity:  Current weight : 213 lbs BMI: 39.03 Previous weight:217 lbs Treatment Tried: currently zepbound Comorbidities: HTN, hyperthyroidism Tolerating medication. Working on lifestyle modification. Continue with current treatment plan.   Recently had labs done at work and had questions regarding her HDL.  Reassurance provided, labs were within normal range.   Relevant past medical, surgical, family and social history reviewed and updated as indicated. Interim medical history since our last visit reviewed. Allergies and medications reviewed and updated.  Review of Systems  Constitutional: Negative for fever or weight change.  Respiratory: Negative for cough and shortness of breath.   Cardiovascular: Negative for chest pain or palpitations.  Gastrointestinal: Negative for abdominal pain, no bowel changes.  Musculoskeletal: Negative for gait problem or joint swelling.  Skin: Negative for rash.  Neurological: Negative for dizziness or headache.  No other specific complaints in a complete review of systems (except as listed in HPI above).      Objective:    BP 126/84   Pulse 98   Temp 98.1 F (36.7 C) (Oral)   Resp 16   Ht 5\' 2"  (1.575 m)   Wt 213 lb 6.4 oz (96.8 kg)   SpO2 99%   BMI 39.03 kg/m   Wt Readings from Last 3 Encounters:  02/28/23 213 lb 6.4 oz (96.8 kg)  01/25/23 217 lb (98.4 kg)  09/12/22 206 lb (93.4 kg)    Physical Exam  Constitutional: Patient appears well-developed and well-nourished. Obese  No distress.  HEENT: head  atraumatic, normocephalic, pupils equal and reactive to light, neck supple Cardiovascular: Normal rate, regular rhythm and normal heart sounds.  No murmur heard. No BLE edema. Pulmonary/Chest: Effort normal and breath sounds normal. No respiratory distress. Abdominal: Soft.  There is no tenderness. Psychiatric: Patient has a normal mood and affect. behavior is normal. Judgment and thought content normal.  Results for orders placed or performed in visit on 09/12/22  Lipid panel  Result Value Ref Range   Cholesterol 95 <200 mg/dL   HDL 62 > OR = 50 mg/dL   Triglycerides 45 <409 mg/dL   LDL Cholesterol (Calc) 20 mg/dL (calc)   Total CHOL/HDL Ratio 1.5 <5.0 (calc)   Non-HDL Cholesterol (Calc) 33 <811 mg/dL (calc)  COMPLETE METABOLIC PANEL WITH GFR  Result Value Ref Range   Glucose, Bld 95 65 - 99 mg/dL   BUN 12 7 - 25 mg/dL   Creat 9.14 7.82 - 9.56 mg/dL   eGFR 213 > OR = 60 YQ/MVH/[id number]   BUN/Creatinine Ratio SEE NOTE: 6 - 22 (calc)   Sodium 145 135 - 146 mmol/L   Potassium 4.5 3.5 - 5.3 mmol/L   Chloride 104 98 - 110 mmol/L   CO2 30 20 - 32 mmol/L   Calcium 10.2 8.6 - 10.4 mg/dL   Total Protein 7.1 6.1 - 8.1 g/dL   Albumin 4.0  3.6 - 5.1 g/dL   Globulin 3.1 1.9 - 3.7 g/dL (calc)   AG Ratio 1.3 1.0 - 2.5 (calc)   Total Bilirubin 0.4 0.2 - 1.2 mg/dL   Alkaline phosphatase (APISO) 83 37 - 153 U/L   AST 17 10 - 35 U/L   ALT 17 6 - 29 U/L  CBC with Differential/Platelet  Result Value Ref Range   WBC 7.7 3.8 - 10.8 Thousand/uL   RBC 4.34 3.80 - 5.10 Million/uL   Hemoglobin 12.5 11.7 - 15.5 g/dL   HCT 81.1 91.4 - 78.2 %   MCV 87.3 80.0 - 100.0 fL   MCH 28.8 27.0 - 33.0 pg   MCHC 33.0 32.0 - 36.0 g/dL   RDW 95.6 21.3 - 08.6 %   Platelets 345 140 - 400 Thousand/uL   MPV 10.3 7.5 - 12.5 fL   Neutro Abs 4,712 1,500 - 7,800 cells/uL   Lymphs Abs 2,464 850 - 3,900 cells/uL   Absolute Monocytes 362 200 - 950 cells/uL   Eosinophils Absolute 131 15 - 500 cells/uL   Basophils Absolute  31 0 - 200 cells/uL   Neutrophils Relative % 61.2 %   Total Lymphocyte 32.0 %   Monocytes Relative 4.7 %   Eosinophils Relative 1.7 %   Basophils Relative 0.4 %  Hemoglobin A1c  Result Value Ref Range   Hgb A1c MFr Bld 5.4 <5.7 % of total Hgb   Mean Plasma Glucose 108 mg/dL   eAG (mmol/L) 6.0 mmol/L  TSH  Result Value Ref Range   TSH 0.38 (L) mIU/L      Assessment & Plan:   Problem List Items Addressed This Visit       Other   Morbid obesity (HCC) - Primary    Continue working on lifestyle modification.  Patient tolerated Zepbound initial dosing.  Will increase dose.  Patient to follow-up in 3 months.  For weight check      Relevant Medications   tirzepatide (ZEPBOUND) 5 MG/0.5ML Pen   Other Visit Diagnoses     Screening for colon cancer       Relevant Orders   Cologuard        Follow up plan: Return in about 3 months (around 05/31/2023) for follow up.

## 2023-02-28 NOTE — Assessment & Plan Note (Signed)
Continue working on lifestyle modification.  Patient tolerated Zepbound initial dosing.  Will increase dose.  Patient to follow-up in 3 months.  For weight check

## 2023-03-01 ENCOUNTER — Other Ambulatory Visit: Payer: Self-pay | Admitting: Family Medicine

## 2023-03-01 DIAGNOSIS — I771 Stricture of artery: Secondary | ICD-10-CM
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
## 2023-03-15 ENCOUNTER — Ambulatory Visit: Payer: Self-pay | Admitting: Physician Assistant

## 2023-03-15 ENCOUNTER — Ambulatory Visit: Payer: Self-pay | Admitting: Family Medicine

## 2023-03-18 ENCOUNTER — Ambulatory Visit: Payer: Self-pay | Admitting: Physician Assistant

## 2023-04-12 ENCOUNTER — Other Ambulatory Visit: Payer: Self-pay | Admitting: Nurse Practitioner

## 2023-04-12 MED ORDER — ZEPBOUND 7.5 MG/0.5ML ~~LOC~~ SOAJ: 7.5000 mg | SUBCUTANEOUS | 0 refills | Status: DC

## 2023-04-12 NOTE — Telephone Encounter (Signed)
Pt want to increase dose

## 2023-04-14 ENCOUNTER — Other Ambulatory Visit: Payer: Self-pay | Admitting: Family Medicine

## 2023-04-25 DIAGNOSIS — Z1211 Encounter for screening for malignant neoplasm of colon: Secondary | ICD-10-CM | POA: Diagnosis not present

## 2023-04-30 ENCOUNTER — Encounter: Payer: Self-pay | Admitting: Family Medicine

## 2023-04-30 ENCOUNTER — Ambulatory Visit (INDEPENDENT_AMBULATORY_CARE_PROVIDER_SITE_OTHER): Payer: BC Managed Care – PPO | Admitting: Family Medicine

## 2023-04-30 DIAGNOSIS — E0590 Thyrotoxicosis, unspecified without thyrotoxic crisis or storm: Secondary | ICD-10-CM

## 2023-04-30 DIAGNOSIS — I10 Essential (primary) hypertension: Secondary | ICD-10-CM

## 2023-04-30 DIAGNOSIS — I771 Stricture of artery: Secondary | ICD-10-CM | POA: Diagnosis not present

## 2023-04-30 DIAGNOSIS — Z1231 Encounter for screening mammogram for malignant neoplasm of breast: Secondary | ICD-10-CM

## 2023-04-30 DIAGNOSIS — E042 Nontoxic multinodular goiter: Secondary | ICD-10-CM

## 2023-04-30 DIAGNOSIS — M79604 Pain in right leg: Secondary | ICD-10-CM

## 2023-04-30 MED ORDER — ZEPBOUND 10 MG/0.5ML ~~LOC~~ SOAJ: 10.0000 mg | SUBCUTANEOUS | 0 refills | Status: DC

## 2023-04-30 MED ORDER — ATORVASTATIN CALCIUM 10 MG PO TABS: 10.0000 mg | ORAL_TABLET | Freq: Every day | ORAL | 1 refills | Status: DC

## 2023-04-30 NOTE — Progress Notes (Signed)
 Name: Wendy Kent   MRN: [id number]    DOB: [birth year]   Date:04/30/2023       Progress Note  Subjective  Chief Complaint  Chief Complaint  Patient presents with   Medical Management of Chronic Issues    HPI  Discussed the use of AI scribe software for clinical note transcription with the patient, who gave verbal consent to proceed.  History of Present Illness   The patient, with a history of morbid obesity, high blood pressure, and a tortuous aorta, presents for a routine follow-up. She reports a change in lifestyle, including a healthier diet and increased physical activity, which has resulted in a weight loss of five pounds since her last visit. She has been taking Zepound for weight loss and reports that it is working for her, despite a minor incident with a recent injection that caused temporary redness and discomfort at the injection site.  The patient also reports a history of adjustment disorder with depressed symptoms, which began in early 2020. She has since stopped taking medications for this condition and reports feeling much better, with no current issues with sleep or mood. She attributes her improved mental health to lifestyle changes and a more stable home environment.  The patient has been managing her high blood pressure with hydrochlorothiazide  and previously took clonidine  primarily for hot flashes, but has since discontinued the latter. She reports no current issues with chest pain, palpitations, nausea, or vomiting.  The patient also has a history of a right Achilles tendon rupture, which was managed conservatively with a boot. She occasionally takes meloxicam  for joint aches related to this condition, particularly when the weather changes. She reports no current pain in the right lower extremity and has full range of motion.  The patient has a history of a right ovarian cyst, which was last evaluated in February of the previous year. She reports no current abdominal  or pelvic pain. She also has a history of subclinical hyperthyroidism, which is being monitored with periodic thyroid  checks. She reports no current symptoms related to this condition.  The patient has been off cholesterol medication for some time due to issues with refills but is willing to restart atorvastatin . She also expresses a desire to increase her dose of Zepound to aid in further weight loss.         Patient Active Problem List   Diagnosis Date Noted   Right ovarian cyst 01/16/2022   Ankle pain 03/30/2021   Rupture of right Achilles tendon 03/30/2021   Thyroid  nodule 11/17/2020   Tortuous aorta (HCC) 05/19/2020   Fatty liver 02/26/2020   Lymphedema 01/21/2019   Varicose veins of leg with pain, bilateral 10/14/2018   Lactose intolerance 09/26/2015   IBS (irritable bowel syndrome) 09/26/2015   Morbid obesity (HCC) 09/28/2014   Hyperthyroidism, subclinical 09/28/2014   Allergic rhinitis, seasonal 09/28/2014   Hypertension goal BP (blood pressure) < 140/90 12/29/2012    Past Surgical History:  Procedure Laterality Date   ABDOMINAL HYSTERECTOMY     patient still has ovaries   BREAST REDUCTION SURGERY Bilateral 08/05/2017   Procedure: BREAST REDUCTION WITH LIPOSUCTION;  Surgeon: Marcus Lung, MD;  Location: [facility];  Service: Plastics;  Laterality: Bilateral;   CHOLECYSTECTOMY     COLONOSCOPY WITH PROPOFOL  N/A 03/26/2018   Procedure: COLONOSCOPY WITH PROPOFOL ;  Surgeon: Therisa Bi, MD;  Location: [facility] ENDOSCOPY;  Service: Gastroenterology;  Laterality: N/A;   ESOPHAGOGASTRODUODENOSCOPY (EGD) WITH PROPOFOL  N/A 03/26/2018   Procedure: ESOPHAGOGASTRODUODENOSCOPY (EGD)  WITH PROPOFOL ;  Surgeon: Therisa Bi, MD;  Location: [facility] ENDOSCOPY;  Service: Gastroenterology;  Laterality: N/A;   GIVENS CAPSULE STUDY N/A 04/14/2018   Procedure: GIVENS CAPSULE STUDY;  Surgeon: Therisa Bi, MD;  Location: [facility] ENDOSCOPY;  Service: Gastroenterology;  Laterality: N/A;    REDUCTION MAMMAPLASTY     2019   SHOULDER ARTHROSCOPY Right    TUBAL LIGATION      Family History  Problem Relation Age of Onset   Hyperthyroidism Sister    Alcohol abuse Brother     Social History   Tobacco Use   Smoking status: Never   Smokeless tobacco: Never  Substance Use Topics   Alcohol use: Yes    Alcohol/week: 0.0 standard drinks of alcohol    Comment: socially     Current Outpatient Medications:    fluticasone  (FLONASE ) 50 MCG/ACT nasal spray, Place 2 sprays into both nostrils daily., Disp: 48 mL, Rfl: 0   hydrochlorothiazide  (HYDRODIURIL ) 25 MG tablet, Take 1 tablet (25 mg total) by mouth daily., Disp: 90 tablet, Rfl: 1   meloxicam  (MOBIC ) 15 MG tablet, Take 1 tablet (15 mg total) by mouth daily., Disp: 30 tablet, Rfl: 0   tirzepatide  (ZEPBOUND ) 7.5 MG/0.5ML Pen, Inject 7.5 mg into the skin once a week., Disp: 2 mL, Rfl: 0   atorvastatin  (LIPITOR) 10 MG tablet, TAKE 1 TABLET BY MOUTH EVERY DAY (Patient not taking: Reported on 04/30/2023), Disp: 90 tablet, Rfl: 0  Allergies  Allergen Reactions   Percocet [Oxycodone-Acetaminophen ] Anaphylaxis   Percocet [Oxycodone-Acetaminophen ] Anaphylaxis    Difficulty breathing     I personally reviewed active problem list, medication list, allergies, family history with the patient/caregiver today.   ROS  Ten systems reviewed and is negative except as mentioned in HPI    Objective  Vitals:   04/30/23 1326  BP: 122/74  Pulse: 92  Resp: 16  Temp: 97.8 F (36.6 C)  TempSrc: Oral  SpO2: 94%  Weight: 208 lb 8 oz (94.6 kg)  Height: 5' 2 (1.575 m)    Body mass index is 38.14 kg/m.  Physical Exam  Constitutional: Patient appears well-developed and well-nourished. Obese  No distress.  HEENT: head atraumatic, normocephalic, pupils equal and reactive to light, neck supple Cardiovascular: Normal rate, regular rhythm and normal heart sounds.  No murmur heard. No BLE edema. Pulmonary/Chest: Effort normal and breath  sounds normal. No respiratory distress. Abdominal: Soft.  There is no tenderness. Psychiatric: Patient has a normal mood and affect. behavior is normal. Judgment and thought content normal.   Diabetic Foot Exam:     PHQ2/9:    04/30/2023    1:24 PM 02/28/2023   11:26 AM 01/25/2023    8:39 AM 09/12/2022    7:47 AM 04/04/2022    1:50 PM  Depression screen PHQ 2/9  Decreased Interest 0 0 0 0 0  Down, Depressed, Hopeless 0 0 0 0 0  PHQ - 2 Score 0 0 0 0 0  Altered sleeping 0  0 0 0  Tired, decreased energy 0  0 0 0  Change in appetite 0  0 0 0  Feeling bad or failure about yourself  0  0 0 0  Trouble concentrating 0  0 0 0  Moving slowly or fidgety/restless 0  0 0 0  Suicidal thoughts 0  0 0 0  PHQ-9 Score 0  0 0 0  Difficult doing work/chores Not difficult at all  Not difficult at all  Not difficult at all  phq 9 is negative   Fall Risk:    04/30/2023    1:18 PM 02/28/2023   11:25 AM 01/25/2023    8:34 AM 09/12/2022    7:47 AM 04/04/2022    1:50 PM  Fall Risk   Falls in the past year? 0 0 0 0 0  Number falls in past yr: 0 0 0 0 0  Injury with Fall? 0 0 0 0 0  Risk for fall due to : No Fall Risks No Fall Risks  No Fall Risks No Fall Risks  Follow up Falls prevention discussed;Education provided;Falls evaluation completed Falls prevention discussed  Falls prevention discussed Falls prevention discussed;Education provided;Falls evaluation completed     Assessment & Plan       Morbid Obesity Patient reports lifestyle changes including healthier diet, increased water intake, and increased physical activity. Currently on Zepound 7.5mg  for weight loss, with a recent 5lb weight loss reported. Patient experienced a minor adverse reaction to Zepound injection, likely due to a busted vessel, which has since resolved. -Increase Zepound to 12.5mg . -Continue lifestyle modifications.  Hypertension Well controlled on HCTZ. -Continue HCTZ.  Hyperlipidemia Patient has been off  Atorvastatin  due to refill issues. -Resume Atorvastatin , provide a 7-month supply.  Subclinical Hyperthyroidism No current symptoms reported. Last thyroid  ultrasound in 2022. -Check thyroid  levels in May 2025.  Right Achilles Tendon Rupture (history) Occasional pain reported, likely weather-related. Managed with Meloxicam  as needed. -Continue Meloxicam  as needed.  Ovarian Cyst Last ultrasound in February 2024 showed a stable right adnexal mass, likely endometrioma. No current symptoms reported. -Schedule follow-up ultrasound to monitor cyst. It was ordered by gyn to be done Fall 2024 but patient did not get it done, reminded her to schedule it   Adjustment Disorder with Depressed Symptoms (history) No current symptoms or treatment reported. -Monitor for any recurrence of symptoms.  General Health Maintenance -Schedule mammogram. -Declined flu and shingles vaccines. -Cologuard test completed last week, awaiting results.

## 2023-05-01 LAB — COLOGUARD: COLOGUARD: NEGATIVE

## 2023-05-04 ENCOUNTER — Encounter: Payer: Self-pay | Admitting: Nurse Practitioner

## 2023-05-17 ENCOUNTER — Other Ambulatory Visit: Payer: Self-pay | Admitting: Family Medicine

## 2023-05-17 MED ORDER — ZEPBOUND 12.5 MG/0.5ML ~~LOC~~ SOAJ: 12.5000 mg | SUBCUTANEOUS | 1 refills | Status: DC

## 2023-05-24 ENCOUNTER — Encounter: Payer: Self-pay | Admitting: Family Medicine

## 2023-05-24 ENCOUNTER — Ambulatory Visit (INDEPENDENT_AMBULATORY_CARE_PROVIDER_SITE_OTHER): Payer: BC Managed Care – PPO | Admitting: Family Medicine

## 2023-05-24 VITALS — BP 106/70 | HR 99 | Resp 16 | Ht 62.0 in | Wt 208.2 lb

## 2023-05-24 DIAGNOSIS — J069 Acute upper respiratory infection, unspecified: Secondary | ICD-10-CM | POA: Diagnosis not present

## 2023-05-24 NOTE — Progress Notes (Signed)
Name: Wendy Kent   MRN: [id number]    DOB: 09/10/1970   Date:05/24/2023       Progress Note  Subjective  Chief Complaint  Chief Complaint  Patient presents with   Nasal Congestion   Facial Pain    Sx started last Friday, had a telephone visit with her insurance and was given tamiflu which she has finished   Discussed the use of AI scribe software for clinical note transcription with the patient, who gave verbal consent to proceed.  History of Present Illness   The patient presents with facial pain and congestion.  Facial pain and congestion began approximately one week ago, initially with head symptoms and a throat sensation. She felt unwell and spent a day in bed, hoping to recover before returning to work. Despite completing a course of Tamiflu given by Telemedicine doctor on Sunday, the congestion and head symptoms persisted, particularly bothersome at night.  She has been using Flonase but feels it is not effectively reaching the affected area. She has not been using any other decongestants or symptom control medications, aside from taking Mucinex DM - but not currently on any medication  Symptoms worsen when consuming cold beverages, and she feels as though the congestion is affecting her inner ear. She describes a sensation of choking or a tickle due to drainage.  She has been off work for several days but returned yesterday, although she still feels the head symptoms persist. No significant body pain or coughing.       Patient Active Problem List   Diagnosis Date Noted   Right ovarian cyst 01/16/2022   Ankle pain 03/30/2021   Rupture of right Achilles tendon 03/30/2021   Thyroid nodule 11/17/2020   Tortuous aorta (HCC) 05/19/2020   Fatty liver 02/26/2020   Lymphedema 01/21/2019   Varicose veins of leg with pain, bilateral 10/14/2018   Lactose intolerance 09/26/2015   IBS (irritable bowel syndrome) 09/26/2015   Morbid obesity (HCC) 09/28/2014   Hyperthyroidism,  subclinical 09/28/2014   Allergic rhinitis, seasonal 09/28/2014   Hypertension goal BP (blood pressure) < 140/90 12/29/2012    Past Surgical History:  Procedure Laterality Date   ABDOMINAL HYSTERECTOMY     patient still has ovaries   BREAST REDUCTION SURGERY Bilateral 08/05/2017   Procedure: BREAST REDUCTION WITH LIPOSUCTION;  Surgeon: Truesdale, Gerald, MD;  Location: [facility];  Service: Plastics;  Laterality: Bilateral;   CHOLECYSTECTOMY     COLONOSCOPY WITH PROPOFOL N/A 03/26/2018   Procedure: COLONOSCOPY WITH PROPOFOL;  Surgeon: Anna, Kiran, MD;  Location: [facility];  Service: Gastroenterology;  Laterality: N/A;   ESOPHAGOGASTRODUODENOSCOPY (EGD) WITH PROPOFOL N/A 03/26/2018   Procedure: ESOPHAGOGASTRODUODENOSCOPY (EGD) WITH PROPOFOL;  Surgeon: Anna, Kiran, MD;  Location: [facility];  Service: Gastroenterology;  Laterality: N/A;   GIVENS CAPSULE STUDY N/A 04/14/2018   Procedure: GIVENS CAPSULE STUDY;  Surgeon: Anna, Kiran, MD;  Location: [facility];  Service: Gastroenterology;  Laterality: N/A;   REDUCTION MAMMAPLASTY     20 19   SHOULDER ARTHROSCOPY Right    TUBAL LIGATION      Family History  Problem Relation Age of Onset   Hyperthyroidism Sister    Alcohol abuse Brother     Social History   Tobacco Use   Smoking status: Never   Smokeless tobacco: Never  Substance Use Topics   Alcohol use: Yes    Alcohol/week: 0.0 standard drinks of alcohol    Comment: socially     Current Outpatient Medications:    atorvastatin (  LIPITOR) 10 MG tablet, Take 1 tablet (10 mg total) by mouth daily., Disp: 90 tablet, Rfl: 1   fluticasone (FLONASE) 50 MCG/ACT nasal spray, Place 2 sprays into both nostrils daily., Disp: 48 mL, Rfl: 0   hydrochlorothiazide (HYDRODIURIL) 25 MG tablet, Take 1 tablet (25 mg total) by mouth daily., Disp: 90 tablet, Rfl: 1   meloxicam (MOBIC) 15 MG tablet, Take 1 tablet (15 mg total) by mouth daily., Disp: 30 tablet, Rfl: 0    tirzepatide (ZEPBOUND) 12.5 MG/0.5ML Pen, Inject 12.5 mg into the skin once a week., Disp: 2 mL, Rfl: 1  Allergies  Allergen Reactions   Percocet [Oxycodone-Acetaminophen] Anaphylaxis   Percocet [Oxycodone-Acetaminophen] Anaphylaxis    Difficulty breathing     I personally reviewed active problem list, medication list, allergies with the patient/caregiver today.   ROS  Ten systems reviewed and is negative except as mentioned in HPI    Objective  Vitals:   05/24/23 1358  BP: 106/70  Pulse: 99  Resp: 16  SpO2: 96%  Weight: 208 lb 3.2 oz (94.4 kg)  Height: 5\' 2"  (1.575 m)    Body mass index is 38.08 kg/m.  Physical Exam  Constitutional: Patient appears well-developed and well-nourished. Obese  No distress.  HEENT: head atraumatic, normocephalic, pupils equal and reactive to light, ears normal TM, neck supple, throat mild erythema, no drainage Tender during percussion of maxillary sinus  Cardiovascular: Normal rate, regular rhythm and normal heart sounds.  No murmur heard. No BLE edema. Pulmonary/Chest: Effort normal and breath sounds normal. No respiratory distress. Abdominal: Soft.  There is no tenderness. Psychiatric: Patient has a normal mood and affect. behavior is normal. Judgment and thought content normal.   Recent Results (from the past 2160 hours)  Cologuard     Status: None   Collection Time: 04/25/23  7:25 PM  Result Value Ref Range   COLOGUARD Negative Negative    Comment: NEGATIVE TEST RESULT. A negative Cologuard result indicates a low likelihood that a colorectal cancer (CRC) or advanced adenoma (adenomatous polyps with more advanced pre-malignant features)  is present. The chance that a person with a negative Cologuard test has a colorectal cancer is less than 1 in 1500 (negative predictive value >99.9%) or has an  advanced adenoma is less than  5.3% (negative predictive value 94.7%). These data are based on a prospective cross-sectional study of 10,000  individuals at average risk for colorectal cancer who were screened with both Cologuard and colonoscopy. (Imperiale T. et al, N Engl J Med 2014;370(14):1286-1297) The normal value (reference range) for this assay is negative.  COLOGUARD RE-SCREENING RECOMMENDATION: Periodic colorectal cancer screening is an important part of preventive healthcare for asymptomatic individuals at average risk for colorectal cancer.  Following a negative Cologuard result, the American Cancer Society and U.S.  Multi-Society Task Force screening guidelines recommend a Cologuard re-screening interval of 3 years.  References: American Cancer Society Guideline for Colorectal Cancer Screening: https://www.cancer.org/cancer/colon-rectal-cancer/detection-diagnosis-staging/acs-recommendations.html.; Rex DK, Boland CR, Dominitz JK, Colorectal Cancer Screening: Recommendations for Physicians and Patients from the U.S. Multi-Society Task Force on Colorectal Cancer Screening , Am J Gastroenterology 2017; 112:1016-1030.  TEST DESCRIPTION: Composite algorithmic analysis of stool DNA-biomarkers with hemoglobin immunoassay.   Quantitative values of individual biomarkers are not reportable and are not associated with individual biomarker result reference ranges. Cologuard is intended for colorectal cancer screening of adults of either sex, 45 years or older, who are at average-risk for colorectal cancer (CRC). Cologuard has been approved for use by the U.S. FDA.  The performance of Cologuard was  established in a cross sectional study of average-risk adults aged 12-84. Cologuard performance in patients ages 29 to 48 years was estimated by sub-group analysis of near-age groups. Colonoscopies performed for a positive result may find as the most clinically significant lesion: colorectal cancer [4.0%], advanced adenoma (including sessile serrated polyps greater than or equal to 1cm diameter) [20%] or non- advanced adenoma [31%]; or no colorectal  neoplasia [45%]. These estimates are derived from a prospective cross-sectional screening study of 10,000 individuals at average risk for colorectal cancer who were screened with both Cologuard and colonoscopy. (Imperiale T. et al, Macy Mis J Med 2014;370(14):1286-1297.) Cologuard may produce a false negative or false positive result (no colorectal cancer or precancerous polyp present at colonoscopy follow up). A negative Cologuard test result does not guarantee the absence of CRC or advanced adenoma (pre-cancer). The current Cologuard  screening interval is every 3 years. Science writer and [place] Therapist, music). Cologuard performance data in a 10,000 patient pivotal study using colonoscopy as the reference method can be accessed at the following location: www.exactlabs.com/results. Additional description of the Cologuard test process, warnings and precautions can be found at www.cologuard.com.     Diabetic Foot Exam:     PHQ2/9:    05/24/2023    1:57 PM 04/30/2023    1:24 PM 02/28/2023   11:26 AM 01/25/2023    8:39 AM 09/12/2022    7:47 AM  Depression screen PHQ 2/9  Decreased Interest 0 0 0 0 0  Down, Depressed, Hopeless 0 0 0 0 0  PHQ - 2 Score 0 0 0 0 0  Altered sleeping 0 0  0 0  Tired, decreased energy 0 0  0 0  Change in appetite 0 0  0 0  Feeling bad or failure about yourself  0 0  0 0  Trouble concentrating 0 0  0 0  Moving slowly or fidgety/restless 0 0  0 0  Suicidal thoughts 0 0  0 0  PHQ-9 Score 0 0  0 0  Difficult doing work/chores Not difficult at all Not difficult at all  Not difficult at all     phq 9 is negative  Fall Risk:    04/30/2023    1:18 PM 02/28/2023   11:25 AM 01/25/2023    8:34 AM 09/12/2022    7:47 AM 04/04/2022    1:50 PM  Fall Risk   Falls in the past year? 0 0 0 0 0  Number falls in past yr: 0 0 0 0 0  Injury with Fall? 0 0 0 0 0  Risk for fall due to : No Fall Risks No Fall Risks  No Fall Risks No Fall Risks  Follow up Falls  prevention discussed;Education provided;Falls evaluation completed Falls prevention discussed  Falls prevention discussed Falls prevention discussed;Education provided;Falls evaluation completed     Assessment and Plan    Upper Respiratory Infection Viral illness with sinus congestion and facial pain. No cough or systemic symptoms. Completed a course of Tamiflu with minimal improvement. -Continue Flonase as currently using. -Start over-the-counter decongestant such as DayQuil or Nyquil. -Use saline nasal spray every 3-4 hours to promote drainage. -If symptoms worsen or do not improve, consider starting antibiotics.

## 2023-06-11 ENCOUNTER — Ambulatory Visit: Payer: BC Managed Care – PPO | Admitting: Family Medicine

## 2023-06-21 IMAGING — MG MM DIGITAL SCREENING BILAT W/ TOMO AND CAD
8 series · 8 of 24 positions shown · non-contrast
Comparison: Previous exam(s).

CLINICAL DATA: Screening.

EXAM:
DIGITAL SCREENING BILATERAL MAMMOGRAM WITH TOMOSYNTHESIS AND CAD
TECHNIQUE: Bilateral screening digital craniocaudal and mediolateral oblique
mammograms were obtained. Bilateral screening digital breast
tomosynthesis was performed. The images were evaluated with
computer-aided detection.

[R MLO synth-2D]
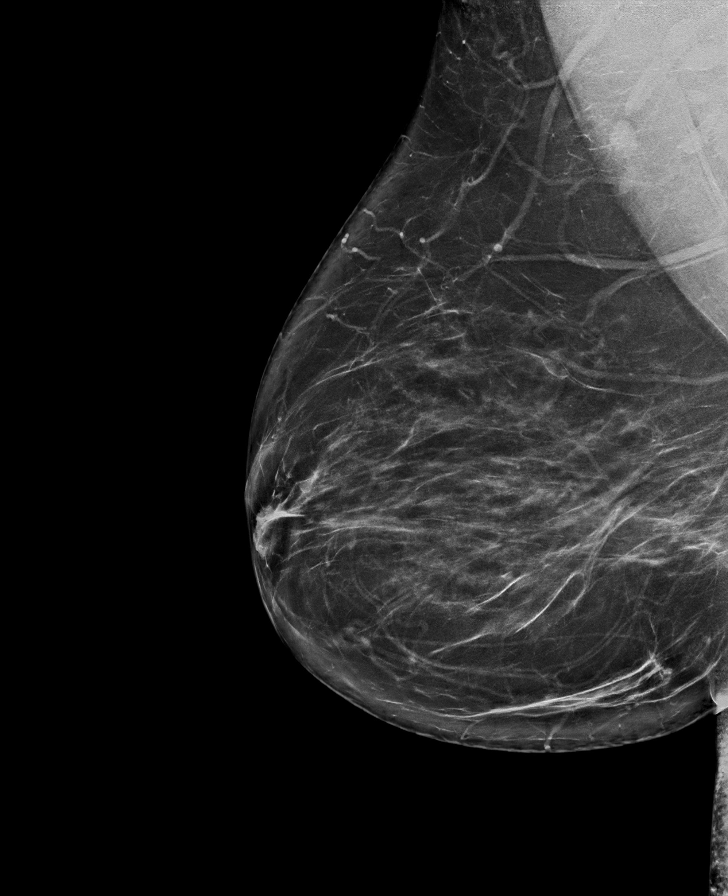

[L MLO synth-2D]
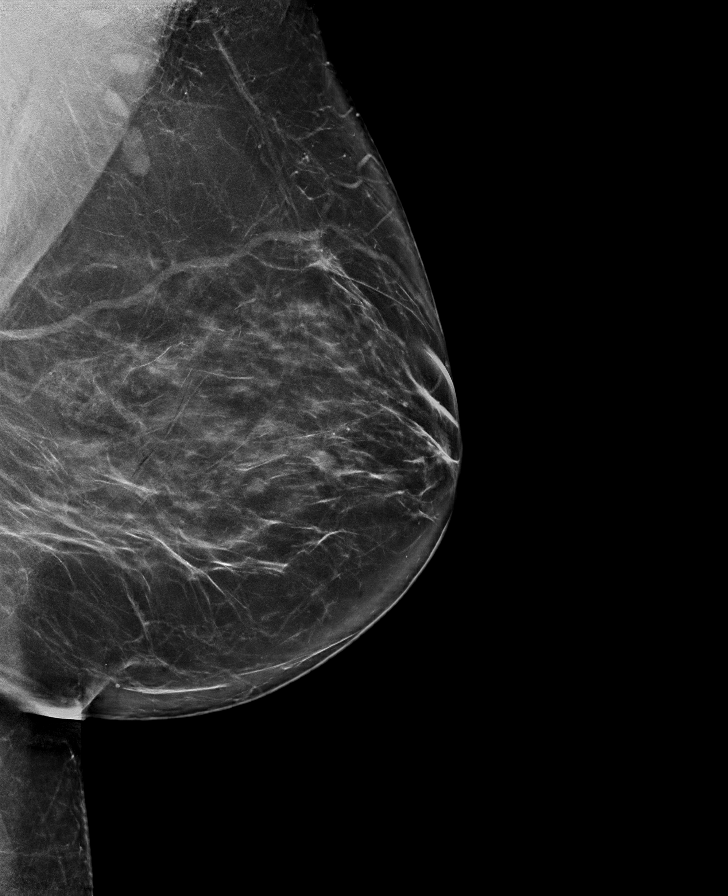

[L CC synth-2D]
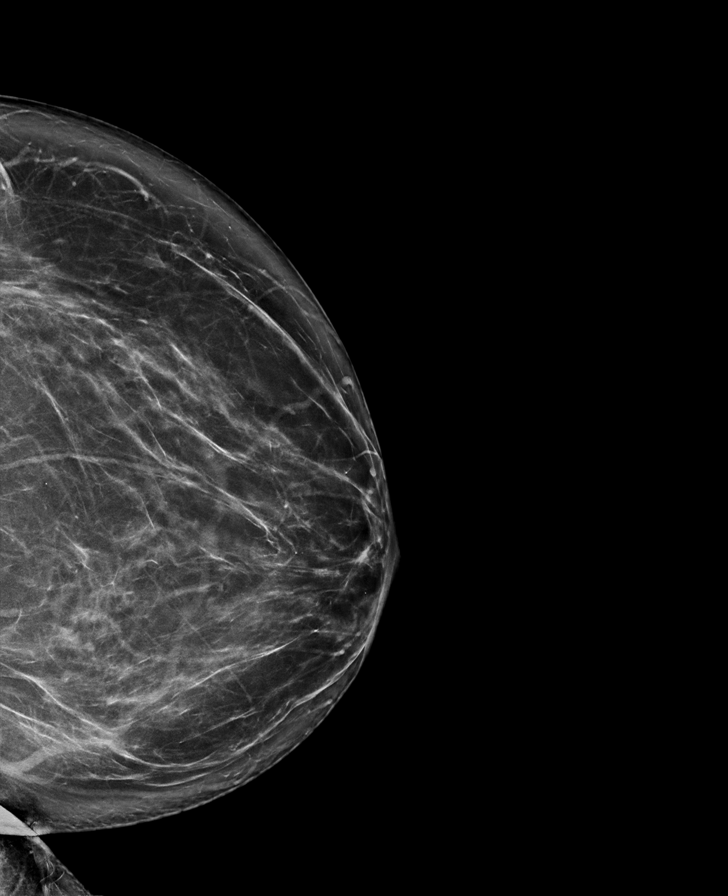

[R CC synth-2D]
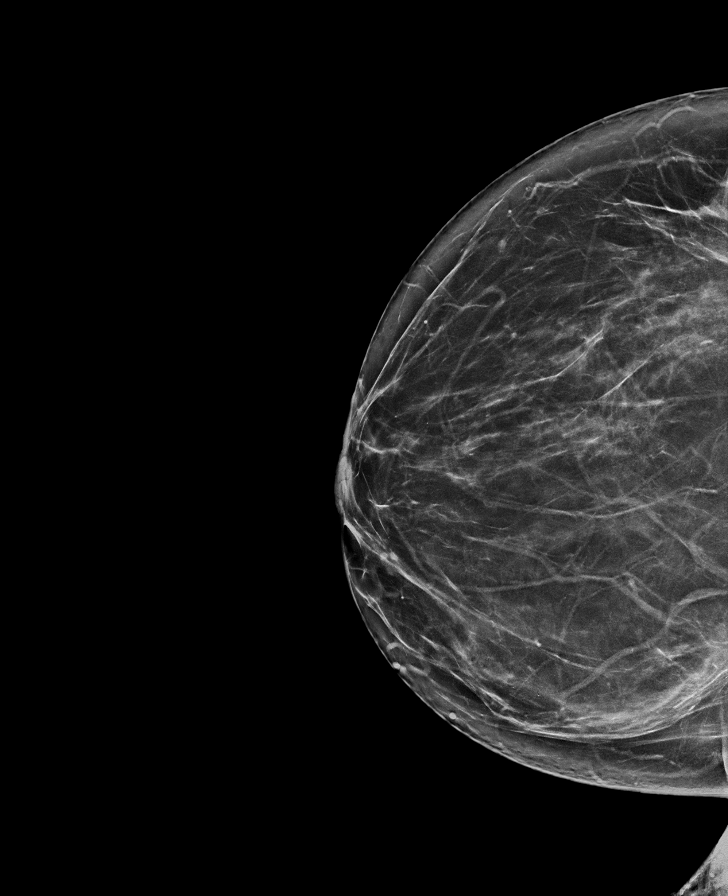

[R CC tomo · tomo slice 39/76.0]
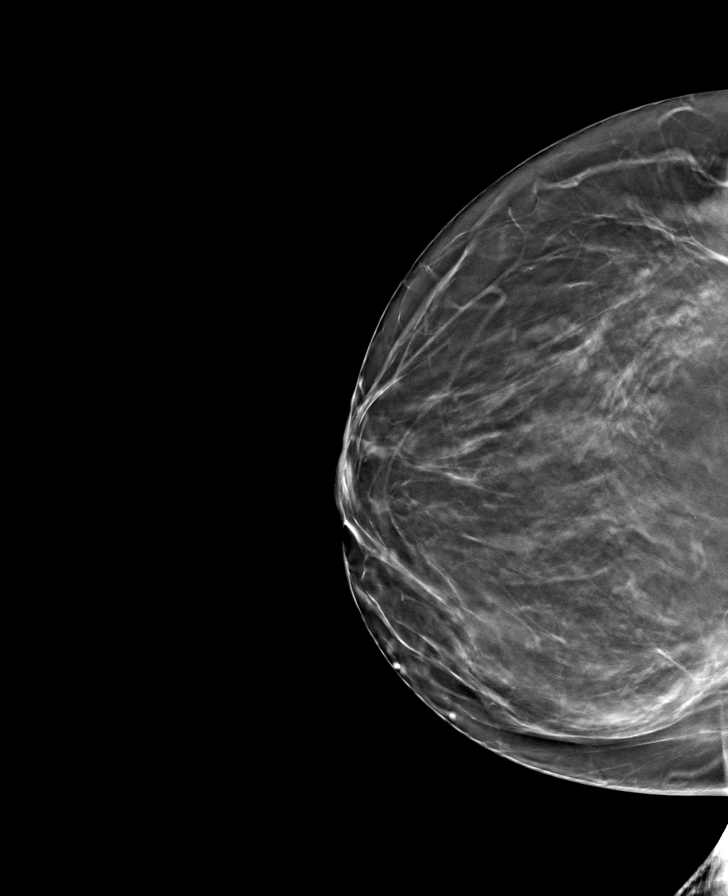

[L CC tomo · tomo slice 43/84.0]
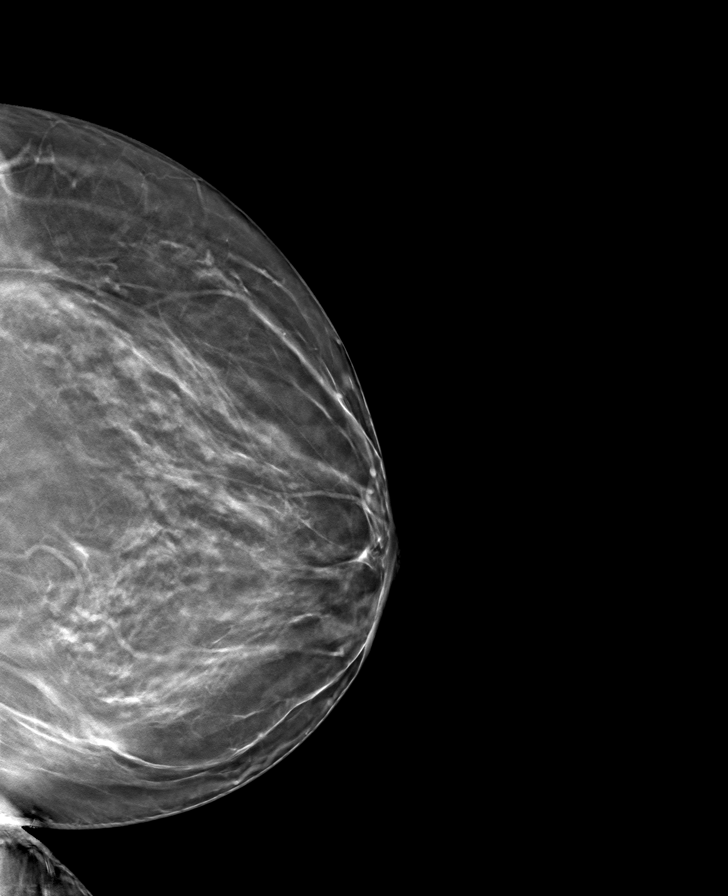

[R MLO tomo · tomo slice 45/89.0]
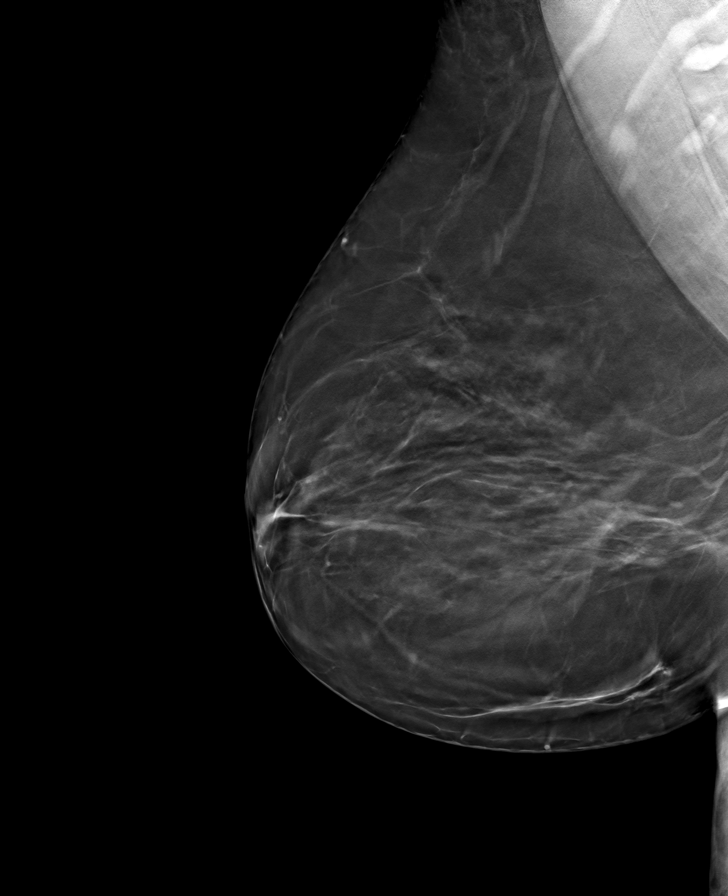

[L MLO tomo · tomo slice 47/93.0]
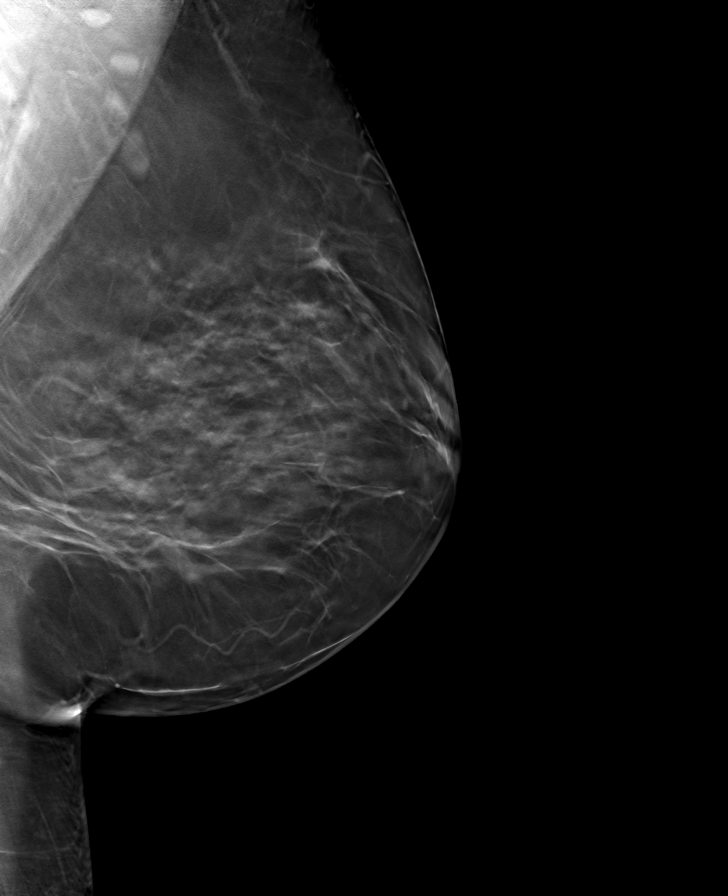

[8 of 24 positions shown; findings below may reference images not displayed]

ACR Breast Density Category b: There are scattered areas of
fibroglandular density.
FINDINGS: There are no findings suspicious for malignancy.
IMPRESSION: No mammographic evidence of malignancy. A result letter of this
screening mammogram will be mailed directly to the patient.

RECOMMENDATION:
Screening mammogram in one year. (Code:51-[id number])

BI-RADS CATEGORY  1: Negative.

## 2023-07-02 ENCOUNTER — Other Ambulatory Visit: Payer: Self-pay | Admitting: Family Medicine

## 2023-07-02 NOTE — Telephone Encounter (Signed)
 Copied from CRM [id number]. Topic: Clinical - Medication Refill >> Jul 02, 2023  1:59 PM Ivette P wrote: Most Recent Primary Care Visit:  Provider: Alba Cory  Department: [facility]  Visit Type: OFFICE VISIT  Date: 05/24/2023  Medication: tirzepatide (ZEPBOUND) 12.5 MG/0.5ML Pen  Has the patient contacted their pharmacy? Yes (Agent: If no, request that the patient contact the pharmacy for the refill. If patient does not wish to contact the pharmacy document the reason why and proceed with request.) (Agent: If yes, when and what did the pharmacy advise?)  Is this the correct pharmacy for this prescription? Yes If no, delete pharmacy and type the correct one.  This is the patient's preferred pharmacy:  [facility] [address], [place] - [address] [place] [address] Phone: [phone] Fax: [phone]   Has the prescription been filled recently? No  Is the patient out of the medication? Yes  Has the patient been seen for an appointment in the last year OR does the patient have an upcoming appointment? Yes  Can we respond through MyChart? Yes  Agent: Please be advised that Rx refills may take up to 3 business days. We ask that you follow-up with your pharmacy.

## 2023-07-03 NOTE — Telephone Encounter (Signed)
 Duplicate request, didn't want to refuse since other nurse was working on other request as well.    Requested Prescriptions  Pending Prescriptions Disp Refills   tirzepatide (ZEPBOUND) 12.5 MG/0.5ML Pen 2 mL 1    Sig: Inject 12.5 mg into the skin once a week.     Off-Protocol Failed - 07/03/2023 11:47 AM      Failed - Medication not assigned to a protocol, review manually.      Passed - Valid encounter within last 12 months    Recent Outpatient Visits           1 month ago Viral URI   [facility] Alba Cory, MD   2 months ago Morbid obesity [facility])   [facility] Alba Cory, MD   4 months ago Morbid obesity [facility])   [facility] Berniece Salines, FNP   5 months ago Morbid obesity [facility])   [facility] Margarita Mail, DO   9 months ago Hyperthyroidism, subclinical   [facility] Alba Cory, MD       Future Appointments             In 2 weeks Alba Cory, MD [facility]   In 3 weeks Alba Cory, MD [facility], [facility]

## 2023-07-03 NOTE — Telephone Encounter (Signed)
 Requested medication (s) are due for refill today: yes  Requested medication (s) are on the active medication list: yes  Last refill:  05/17/23 2 ml 1 RF  Future visit scheduled: no  Notes to clinic:  med not assigned to a protocol.   Requested Prescriptions  Pending Prescriptions Disp Refills   tirzepatide (ZEPBOUND) 12.5 MG/0.5ML Pen 2 mL 1    Sig: Inject 12.5 mg into the skin once a week.     Off-Protocol Failed - 07/03/2023 11:47 AM      Failed - Medication not assigned to a protocol, review manually.      Passed - Valid encounter within last 12 months    Recent Outpatient Visits           1 month ago Viral URI   [facility] Alba Cory, MD   2 months ago Morbid obesity [facility])   [facility] Alba Cory, MD   4 months ago Morbid obesity [facility])   [facility] Berniece Salines, FNP   5 months ago Morbid obesity [facility])   [facility] Margarita Mail, DO   9 months ago Hyperthyroidism, subclinical   [facility] Alba Cory, MD       Future Appointments             In 2 weeks Alba Cory, MD [facility], [facility]   In 3 weeks Alba Cory, MD [facility], [facility]

## 2023-07-09 NOTE — Telephone Encounter (Unsigned)
 Copied from CRM [id number]. Topic: Clinical - Medication Refill >> Jul 02, 2023  1:59 PM Ivette P wrote: Most Recent Primary Care Visit:  Provider: Alba Cory  Department: [facility]  Visit Type: OFFICE VISIT  Date: 05/24/2023  Medication: tirzepatide (ZEPBOUND) 12.5 MG/0.5ML Pen  Has the patient contacted their pharmacy? Yes (Agent: If no, request that the patient contact the pharmacy for the refill. If patient does not wish to contact the pharmacy document the reason why and proceed with request.) (Agent: If yes, when and what did the pharmacy advise?)  Is this the correct pharmacy for this prescription? Yes If no, delete pharmacy and type the correct one.  This is the patient's preferred pharmacy:  [facility] [address], [place] - [address] [place] [address] Phone: [phone] Fax: [phone]   Has the prescription been filled recently? No  Is the patient out of the medication? Yes  Has the patient been seen for an appointment in the last year OR does the patient have an upcoming appointment? Yes  Can we respond through MyChart? Yes  Agent: Please be advised that Rx refills may take up to 3 business days. We ask that you follow-up with your pharmacy.

## 2023-07-15 ENCOUNTER — Other Ambulatory Visit: Payer: Self-pay

## 2023-07-15 NOTE — Telephone Encounter (Signed)
 Pt is requesting a call on refill on Zepbound. Pt has been calling since 07/02/23. Please advise

## 2023-07-23 ENCOUNTER — Encounter: Payer: Self-pay | Admitting: Family Medicine

## 2023-07-23 ENCOUNTER — Ambulatory Visit: Payer: BC Managed Care – PPO | Admitting: Family Medicine

## 2023-07-23 DIAGNOSIS — I771 Stricture of artery: Secondary | ICD-10-CM | POA: Diagnosis not present

## 2023-07-23 DIAGNOSIS — I10 Essential (primary) hypertension: Secondary | ICD-10-CM | POA: Diagnosis not present

## 2023-07-23 DIAGNOSIS — E042 Nontoxic multinodular goiter: Secondary | ICD-10-CM

## 2023-07-23 MED ORDER — ZEPBOUND 12.5 MG/0.5ML ~~LOC~~ SOAJ: 12.5000 mg | SUBCUTANEOUS | 1 refills | Status: DC

## 2023-07-23 MED ORDER — HYDROCHLOROTHIAZIDE 25 MG PO TABS: 25.0000 mg | ORAL_TABLET | Freq: Every day | ORAL | 1 refills | Status: DC
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
## 2023-07-29 ENCOUNTER — Ambulatory Visit: Payer: Self-pay | Admitting: Family Medicine

## 2023-08-06 ENCOUNTER — Ambulatory Visit
Admission: RE | Admit: 2023-08-06 | Discharge: 2023-08-06 | Disposition: A | Discharge status: Home / Self Care | Source: Ambulatory Visit | Attending: Family Medicine | Admitting: Family Medicine

## 2023-08-06 DIAGNOSIS — Z1231 Encounter for screening mammogram for malignant neoplasm of breast: Secondary | ICD-10-CM | POA: Diagnosis not present

## 2023-08-25 ENCOUNTER — Other Ambulatory Visit: Payer: Self-pay | Admitting: Family Medicine

## 2023-09-06 ENCOUNTER — Other Ambulatory Visit: Payer: Self-pay | Admitting: Family Medicine

## 2023-09-26 ENCOUNTER — Other Ambulatory Visit (HOSPITAL_COMMUNITY): Payer: Self-pay

## 2023-10-04 ENCOUNTER — Telehealth: Payer: Self-pay | Admitting: Pharmacy Technician

## 2023-10-04 ENCOUNTER — Other Ambulatory Visit (HOSPITAL_COMMUNITY): Payer: Self-pay

## 2023-10-04 NOTE — Telephone Encounter (Signed)
 Insurance companies are becoming increasingly stricter about requiring thorough documentation of lifestyle modifications in the patient's chart at each visit. This includes detailed records of diet recommendations (caloric intake, etc), exercise plans (amount of time/wk, etc), and an emphasis on the patient's commitment to continuing these efforts while on medication.  Without this additional documentation in the chart notes, a prior authorization will most likely be denied.   Most importantly we need a documented weight check within the last 45 days please.  Thanks

## 2023-10-09 ENCOUNTER — Other Ambulatory Visit (HOSPITAL_COMMUNITY): Payer: Self-pay

## 2023-10-09 NOTE — Telephone Encounter (Unsigned)
 Copied from CRM [id number]. Topic: Clinical - Medication Question >> Oct 09, 2023  7:59 AM Precious C wrote: Reason for CRM: Pt called in to make appt for prior auth for Zepbound  but no apptiontments are avavilable until Jul 31 which is too far out for pt., pt wants to how to proceed.

## 2023-10-14 ENCOUNTER — Other Ambulatory Visit (HOSPITAL_COMMUNITY): Payer: Self-pay

## 2023-10-14 ENCOUNTER — Encounter: Payer: Self-pay | Admitting: Family Medicine

## 2023-10-14 ENCOUNTER — Ambulatory Visit: Admitting: Family Medicine

## 2023-10-14 DIAGNOSIS — J3089 Other allergic rhinitis: Secondary | ICD-10-CM

## 2023-10-14 DIAGNOSIS — J302 Other seasonal allergic rhinitis: Secondary | ICD-10-CM

## 2023-10-14 DIAGNOSIS — I10 Essential (primary) hypertension: Secondary | ICD-10-CM | POA: Diagnosis not present

## 2023-10-14 DIAGNOSIS — E042 Nontoxic multinodular goiter: Secondary | ICD-10-CM | POA: Diagnosis not present

## 2023-10-14 DIAGNOSIS — I771 Stricture of artery: Secondary | ICD-10-CM | POA: Diagnosis not present

## 2023-10-14 DIAGNOSIS — N83291 Other ovarian cyst, right side: Secondary | ICD-10-CM

## 2023-10-14 MED ORDER — ZEPBOUND 15 MG/0.5ML ~~LOC~~ SOAJ: 15.0000 mg | SUBCUTANEOUS | 0 refills | Status: DC

## 2023-10-14 MED ORDER — ATORVASTATIN CALCIUM 10 MG PO TABS: 10.0000 mg | ORAL_TABLET | Freq: Every day | ORAL | 1 refills | Status: DC

## 2023-10-14 MED ORDER — HYDROCHLOROTHIAZIDE 12.5 MG PO TABS: 12.5000 mg | ORAL_TABLET | Freq: Every day | ORAL | 0 refills | Status: DC

## 2023-10-14 MED ORDER — FLUTICASONE PROPIONATE 50 MCG/ACT NA SUSP: 2.0000 | Freq: Every day | NASAL | 0 refills | Status: DC

## 2023-10-14 NOTE — Progress Notes (Addendum)
 Name: Wendy Kent   MRN: [id number]    DOB: 10-19-1970   Date:10/14/2023       Progress Note  Subjective  Chief Complaint  Chief Complaint  Patient presents with   Medical Management of Chronic Issues   Discussed the use of AI scribe software for clinical note transcription with the patient, who gave verbal consent to proceed.  History of Present Illness Wendy Kent is a 53 year old female with obesity and hypertension who presents for a follow-up visit regarding obesity management.  She has been on Zepbound , starting at 5 mg and gradually increasing to 12.5 mg. Her weight has been stable at 201 pounds, down from a previous high of 226 pounds. She practices intermittent fasting, stopping eating at 8 PM and resuming at lunchtime the next day, and engages in physical activities such as walking and using an exercise bike for 30 minutes daily. No side effects from Zepbound  are reported.  She has a history of hypertension, currently managed with hydrochlorothiazide  25 mg daily. She occasionally experiences dizziness upon standing. She also takes atorvastatin  for dyslipidemia and reports no side effects from this medication.  She experiences year-round allergies, which worsen seasonally. She uses Flonase  as needed for nasal itching and occasional sneezing. She has not tried other allergy medications due to insurance coverage issues.  She has a history of subclinical hyperthyroidism and thyromegaly, previously evaluated with an ultrasound. She follows up annually for this condition. Additionally, she has a complex cyst on her right ovary, previously evaluated with a pelvic ultrasound and CA-125 test. She is due for a follow-up appointment to monitor this condition.  No chest pain, palpitations, or constipation. Occasional dizziness when standing up. Nasal itching and occasional sneezing related to her allergies.    Patient Active Problem List   Diagnosis Date Noted   Right ovarian  cyst 01/16/2022   Ankle pain 03/30/2021   Rupture of right Achilles tendon 03/30/2021   Thyroid  nodule 11/17/2020   Tortuous aorta (HCC) 05/19/2020   Fatty liver 02/26/2020   Lymphedema 01/21/2019   Varicose veins of leg with pain, bilateral 10/14/2018   Lactose intolerance 09/26/2015   IBS (irritable bowel syndrome) 09/26/2015   Multinodular goiter 09/28/2014   Morbid obesity (HCC) 09/28/2014   Hyperthyroidism, subclinical 09/28/2014   Allergic rhinitis, seasonal 09/28/2014   Hypertension goal BP (blood pressure) < 140/90 12/29/2012    Past Surgical History:  Procedure Laterality Date   ABDOMINAL HYSTERECTOMY     patient still has ovaries   BREAST REDUCTION SURGERY Bilateral 08/05/2017   Procedure: BREAST REDUCTION WITH LIPOSUCTION;  Surgeon: Marcus Lung, MD;  Location: [facility];  Service: Plastics;  Laterality: Bilateral;   CHOLECYSTECTOMY     COLONOSCOPY WITH PROPOFOL  N/A 03/26/2018   Procedure: COLONOSCOPY WITH PROPOFOL ;  Surgeon: Therisa Bi, MD;  Location: [facility] ENDOSCOPY;  Service: Gastroenterology;  Laterality: N/A;   ESOPHAGOGASTRODUODENOSCOPY (EGD) WITH PROPOFOL  N/A 03/26/2018   Procedure: ESOPHAGOGASTRODUODENOSCOPY (EGD) WITH PROPOFOL ;  Surgeon: Therisa Bi, MD;  Location: [facility] ENDOSCOPY;  Service: Gastroenterology;  Laterality: N/A;   GIVENS CAPSULE STUDY N/A 04/14/2018   Procedure: GIVENS CAPSULE STUDY;  Surgeon: Therisa Bi, MD;  Location: [facility] ENDOSCOPY;  Service: Gastroenterology;  Laterality: N/A;   REDUCTION MAMMAPLASTY     2019   SHOULDER ARTHROSCOPY Right    TUBAL LIGATION      Family History  Problem Relation Age of Onset   Hyperthyroidism Sister    Alcohol abuse Brother     Social History  Tobacco Use   Smoking status: Never   Smokeless tobacco: Never  Substance Use Topics   Alcohol use: Yes    Alcohol/week: 0.0 standard drinks of alcohol    Comment: socially     Current Outpatient Medications:    atorvastatin   (LIPITOR) 10 MG tablet, Take 1 tablet (10 mg total) by mouth daily., Disp: 90 tablet, Rfl: 1   fluticasone  (FLONASE ) 50 MCG/ACT nasal spray, SPRAY 2 SPRAYS INTO EACH NOSTRIL EVERY DAY, Disp: 48 mL, Rfl: 0   hydrochlorothiazide  (HYDRODIURIL ) 25 MG tablet, Take 1 tablet (25 mg total) by mouth daily., Disp: 90 tablet, Rfl: 1   tirzepatide  (ZEPBOUND ) 12.5 MG/0.5ML Pen, Inject 12.5 mg into the skin once a week., Disp: 6 mL, Rfl: 1  Allergies  Allergen Reactions   Percocet [Oxycodone-Acetaminophen ] Anaphylaxis   Percocet Bruna.Burkes ] Anaphylaxis    Difficulty breathing     I personally reviewed active problem list, medication list, allergies with the patient/caregiver today.   ROS  Ten systems reviewed and is negative except as mentioned in HPI    Objective Physical Exam Constitutional: Patient appears well-developed and well-nourished. Obese  No distress.  HEENT: Kent atraumatic, normocephalic, pupils equal and reactive to light, neck supple Cardiovascular: Normal rate, regular rhythm and normal heart sounds.  No murmur heard. No BLE edema. Pulmonary/Chest: Effort normal and breath sounds normal. No respiratory distress. Abdominal: Soft.  There is no tenderness. Psychiatric: Patient has a normal mood and affect. behavior is normal. Judgment and thought content normal.     Vitals:   10/14/23 1303  BP: 112/68  Pulse: 97  Resp: 16  Weight: 201 lb 9.6 oz (91.4 kg)  Height: 5' 2 (1.575 m)    Body mass index is 36.87 kg/m.    PHQ2/9:    10/14/2023   12:58 PM 05/24/2023    1:57 PM 04/30/2023    1:24 PM 02/28/2023   11:26 AM 01/25/2023    8:39 AM  Depression screen PHQ 2/9  Decreased Interest 0 0 0 0 0  Down, Depressed, Hopeless 0 0 0 0 0  PHQ - 2 Score 0 0 0 0 0  Altered sleeping 0 0 0  0  Tired, decreased energy 0 0 0  0  Change in appetite 0 0 0  0  Feeling bad or failure about yourself  0 0 0  0  Trouble concentrating 0 0 0  0  Moving slowly or  fidgety/restless 0 0 0  0  Suicidal thoughts 0 0 0  0  PHQ-9 Score 0 0 0  0  Difficult doing work/chores Not difficult at all Not difficult at all Not difficult at all  Not difficult at all    phq 9 is negative  Fall Risk:    10/14/2023   12:58 PM 04/30/2023    1:18 PM 02/28/2023   11:25 AM 01/25/2023    8:34 AM 09/12/2022    7:47 AM  Fall Risk   Falls in the past year? 0 0 0 0 0  Number falls in past yr: 0 0 0 0 0  Injury with Fall? 0 0 0 0 0  Risk for fall due to : No Fall Risks No Fall Risks No Fall Risks  No Fall Risks  Follow up Falls prevention discussed;Education provided;Falls evaluation completed Falls prevention discussed;Education provided;Falls evaluation completed Falls prevention discussed  Falls prevention discussed     Assessment & Plan Obesity Morbid  Obesity with BMI >35, comorbidities include hypertension, atherosclerosis, joint issues. Weight  loss of 19 lbs achieved with Wegovy  and Zepbound . No side effects from Zepbound . Further weight loss desired. - Increase Zepbound  to 15 mg. - Continue intermittent fasting and physical activity. - Schedule follow-up every three months for insurance coverage.  Hypertension Hypertension controlled at 112/68 mmHg. Possible dizziness related to weight loss and HCTZ dose. - Monitor blood pressure and dizziness.  Dyslipidemia/Tortuous aorta Dyslipidemia managed with atorvastatin . No side effects reported. - Continue atorvastatin . - Refill atorvastatin  prescription.  Complex ovarian cyst Complex cyst on right ovary. Missed gynecologist follow-up in June 2024. Follow-up needed to rule out malignancy. - Schedule follow up with gynecologist for reassessment.  Subclinical hyperthyroidism Subclinical hyperthyroidism with thyromegaly. Previously evaluated by endocrinologist. - Continue annual endocrinologist follow-up.  Allergic rhinitis with seasonal variation Allergic rhinitis with nasal itching and sneezing, exacerbated by  heat and environment. Fluticasone  used intermittently. - Continue fluticasone  as needed. - Monitor for epistaxis.

## 2023-10-16 ENCOUNTER — Other Ambulatory Visit (HOSPITAL_COMMUNITY): Payer: Self-pay

## 2023-10-16 ENCOUNTER — Telehealth: Payer: Self-pay | Admitting: Pharmacy Technician

## 2023-10-16 NOTE — Telephone Encounter (Signed)
 ERROR

## 2023-10-16 NOTE — Telephone Encounter (Signed)
 Pharmacy Patient Advocate Encounter   Received notification from CoverMyMeds that prior authorization for Zepbound  15MG /0.5ML pen-injectors is required/requested.   Insurance verification completed.   The patient is insured through [employer] .   Per test claim: PA required; PA submitted to above mentioned insurance via CoverMyMeds Key/confirmation #/EOC [facility] Status is pending

## 2023-10-16 NOTE — Telephone Encounter (Signed)
 Pharmacy Patient Advocate Encounter  Received notification from [facility] that Prior Authorization for Zepbound  15MG /0.5ML pen-injectors has been APPROVED from 10/16/23 to 10/15/24. Ran test claim, Copay is $0.00. This test claim was processed through [facility]- copay amounts may vary at other pharmacies due to pharmacy/plan contracts, or as the patient moves through the different stages of their insurance plan.   PA #/Case ID/Reference #: [id number]

## 2023-10-17 ENCOUNTER — Other Ambulatory Visit (HOSPITAL_COMMUNITY): Payer: Self-pay

## 2023-11-08 ENCOUNTER — Encounter: Payer: Self-pay | Admitting: Family Medicine

## 2023-11-08 ENCOUNTER — Encounter: Admitting: Family Medicine

## 2023-11-26 ENCOUNTER — Other Ambulatory Visit: Payer: Self-pay | Admitting: Family Medicine

## 2023-11-26 DIAGNOSIS — I10 Essential (primary) hypertension: Secondary | ICD-10-CM

## 2023-12-11 ENCOUNTER — Other Ambulatory Visit: Payer: Self-pay | Admitting: Family Medicine

## 2023-12-12 ENCOUNTER — Other Ambulatory Visit: Payer: Self-pay | Admitting: Family Medicine

## 2023-12-12 ENCOUNTER — Other Ambulatory Visit (HOSPITAL_COMMUNITY): Payer: Self-pay

## 2023-12-12 ENCOUNTER — Telehealth: Payer: Self-pay | Admitting: Pharmacy Technician

## 2023-12-12 MED ORDER — WEGOVY 2.4 MG/0.75ML ~~LOC~~ SOAJ: 2.4000 mg | SUBCUTANEOUS | 0 refills | Status: DC

## 2023-12-12 NOTE — Telephone Encounter (Signed)
 PA request has been Received. New Encounter has been or will be created for follow up. For additional info see Pharmacy Prior Auth telephone encounter from 12/12/23.

## 2023-12-12 NOTE — Telephone Encounter (Signed)
 Pharmacy Patient Advocate Encounter   Received notification from RX Request Messages that prior authorization for Zepbound  15 mg/0.5 ml pen is required/requested.   Insurance verification completed.   The patient is insured through [employer] .   Per test claim:  Wegovy   is preferred by the insurance.  If suggested medication is appropriate, Please send in a new RX and discontinue this one. If not, please advise as to why it's not appropriate so that we may request a Prior Authorization. Please note, some preferred medications may still require a PA.  If the suggested medications have not been trialed and there are no contraindications to their use, the PA will not be submitted, as it will not be approved.   Beginning October 22, 2023 [employer] stopped covering Zepbound  exclusively for weight loss, and will cover Wegovy  for weight loss. Test bill for Wegovy  and her copay came back at $0.00

## 2023-12-13 NOTE — Telephone Encounter (Signed)
 Patient aware to call pharmacy and let us  know if any issues

## 2023-12-13 NOTE — Telephone Encounter (Signed)
 Pt will do Wegovy  2.4 dose.

## 2024-01-17 ENCOUNTER — Ambulatory Visit: Admitting: Family Medicine

## 2024-01-17 ENCOUNTER — Encounter: Payer: Self-pay | Admitting: Family Medicine

## 2024-01-17 DIAGNOSIS — I10 Essential (primary) hypertension: Secondary | ICD-10-CM | POA: Diagnosis not present

## 2024-01-17 DIAGNOSIS — R7303 Prediabetes: Secondary | ICD-10-CM

## 2024-01-17 DIAGNOSIS — J3089 Other allergic rhinitis: Secondary | ICD-10-CM

## 2024-01-17 DIAGNOSIS — Z1159 Encounter for screening for other viral diseases: Secondary | ICD-10-CM

## 2024-01-17 DIAGNOSIS — J302 Other seasonal allergic rhinitis: Secondary | ICD-10-CM

## 2024-01-17 DIAGNOSIS — E0590 Thyrotoxicosis, unspecified without thyrotoxic crisis or storm: Secondary | ICD-10-CM | POA: Diagnosis not present

## 2024-01-17 DIAGNOSIS — I771 Stricture of artery: Secondary | ICD-10-CM

## 2024-01-17 MED ORDER — HYDROCHLOROTHIAZIDE 12.5 MG PO TABS: 12.5000 mg | ORAL_TABLET | Freq: Every day | ORAL | 0 refills | Status: DC

## 2024-01-17 MED ORDER — WEGOVY 2.4 MG/0.75ML ~~LOC~~ SOAJ: 2.4000 mg | SUBCUTANEOUS | 0 refills | Status: DC

## 2024-01-17 MED ORDER — FLUTICASONE PROPIONATE 50 MCG/ACT NA SUSP: 2.0000 | Freq: Every day | NASAL | 0 refills | Status: AC

## 2024-01-17 NOTE — Progress Notes (Signed)
 Name: Wendy Kent   MRN: [id number]    DOB: 1970/05/02   Date:01/17/2024       Progress Note  Subjective  Chief Complaint  Chief Complaint  Patient presents with   Medical Management of Chronic Issues   Discussed the use of AI scribe software for clinical note transcription with the patient, who gave verbal consent to proceed.  History of Present Illness Wendy Kent is a 53 year old female with obesity, dyslipidemia, and hypertension who presents for weight management and medication review.  She is currently on Wegovy  for weight management, having switched from Zepbound  in August due to insurance issues. She has lost 10 pounds since her last visit in June, with her current weight at 191.7 pounds. She feels that Zepbound  was more effective in curbing her appetite, allowing her to feel full after just a few bites of food. She is worried that since she changed back to Wegovy  her weight will go up, change was requested by insurance one month ago and she is on 2.4 mg dose  She has a history of hypertension and is on atorvastatin  for dyslipidemia. She uses Flonase  for perennial allergic rhinitis with seasonal variation. No recent episodes of dizziness upon standing, which she experienced in the past.  She is actively engaging in physical activity, walking regularly since moving to a new development after closing on her house on August 29. She is trying to maintain a routine of walking for 30 minutes a day to keep her body moving.  Her past medical history includes subclinical hypothyroidism, for which she is not currently on medication. She has not had blood work since May, and there is a need to check her lipid panel, TSH, and A1c due to prediabetes.  Socially, she has recently moved to a new home and is adjusting to her new environment. Her son is preparing to join [employer], and she is emotionally well and supportive of his decision.    Patient Active Problem List   Diagnosis  Date Noted   Right ovarian cyst 01/16/2022   Ankle pain 03/30/2021   Rupture of right Achilles tendon 03/30/2021   Thyroid  nodule 11/17/2020   Tortuous aorta 05/19/2020   Fatty liver 02/26/2020   Lymphedema 01/21/2019   Varicose veins of leg with pain, bilateral 10/14/2018   Lactose intolerance 09/26/2015   IBS (irritable bowel syndrome) 09/26/2015   Multinodular goiter 09/28/2014   Morbid obesity (HCC) 09/28/2014   Hyperthyroidism, subclinical 09/28/2014   Allergic rhinitis, seasonal 09/28/2014   Hypertension goal BP (blood pressure) < 140/90 12/29/2012    Past Surgical History:  Procedure Laterality Date   ABDOMINAL HYSTERECTOMY     patient still has ovaries   BREAST REDUCTION SURGERY Bilateral 08/05/2017   Procedure: BREAST REDUCTION WITH LIPOSUCTION;  Surgeon: Marcus Lung, MD;  Location: [facility];  Service: Plastics;  Laterality: Bilateral;   CHOLECYSTECTOMY     COLONOSCOPY WITH PROPOFOL  N/A 03/26/2018   Procedure: COLONOSCOPY WITH PROPOFOL ;  Surgeon: Therisa Bi, MD;  Location: [facility] END[facility]COPY;  Service: Gastroenterology;  Laterality: N/A;   ESOPHAGOGASTRODUODENOSCOPY (EGD) WITH PROPOFOL  N/A 03/26/2018   Procedure: ESOPHAGOGASTRODUODENOSCOPY (EGD) WITH PROPOFOL ;  Surgeon: Therisa Bi, MD;  Location: [facility]COPY;  Service: Gastroenterology;  Laterality: N/A;   GIVENS CAPSULE STUDY N/A 04/14/2018   Procedure: GIVENS CAPSULE STUDY;  Surgeon: Therisa Bi, MD;  Location: [facility] ENDOSCOPY;  Service: Gastroenterology;  Laterality: N/A;   REDUCTION MAMMAPLASTY     2019   SHOULDER ARTHROSCOPY Right  TUBAL LIGATION      Family History  Problem Relation Age of Onset   Hyperthyroidism Sister    Alcohol abuse Brother     Social History   Tobacco Use   Smoking status: Never   Smokeless tobacco: Never  Substance Use Topics   Alcohol use: Yes    Alcohol/week: 0.0 standard drinks of alcohol    Comment: socially     Current Outpatient Medications:     atorvastatin  (LIPITOR) 10 MG tablet, Take 1 tablet (10 mg total) by mouth daily., Disp: 90 tablet, Rfl: 1   fluticasone  (FLONASE ) 50 MCG/ACT nasal spray, Place 2 sprays into both nostrils daily., Disp: 48 mL, Rfl: 0   hydrochlorothiazide  (HYDRODIURIL ) 12.5 MG tablet, Take 1 tablet (12.5 mg total) by mouth daily., Disp: 90 tablet, Rfl: 0   semaglutide -weight management (WEGOVY ) 2.4 MG/0.75ML SOAJ SQ injection, Inject 2.4 mg into the skin once a week. In place of Zepbound  due to insurance no longer covering Zepbound , Disp: 9 mL, Rfl: 0  Allergies  Allergen Reactions   Percocet [Oxycodone-Acetaminophen ] Anaphylaxis   Percocet [Oxycodone-Acetaminophen ] Anaphylaxis    Difficulty breathing     I personally reviewed active problem list, medication list, allergies, family history with the patient/caregiver today.   ROS  Ten systems reviewed and is negative except as mentioned in HPI    Objective Physical Exam VITALS: BP- 118/74 MEASUREMENTS: Weight- 191.7, BMI- 35.01. CONSTITUTIONAL: Patient appears well-developed and well-nourished.  No distress. HEENT: Head atraumatic, normocephalic, neck supple. CARDIOVASCULAR: Normal rate, regular rhythm and normal heart sounds.  No murmur heard. No BLE edema. PULMONARY: Effort normal and breath sounds normal. No respiratory distress. ABDOMINAL: There is no tenderness or distention. MUSCULOSKELETAL: Normal gait. Without gross motor or sensory deficit. PSYCHIATRIC: Patient has a normal mood and affect. behavior is normal. Judgment and thought content normal.  Vitals:   01/17/24 1422  BP: 118/74  Pulse: 97  Resp: 16  SpO2: 100%  Weight: 191 lb 11.2 oz (87 kg)  Height: 5' 2 (1.575 m)    Body mass index is 35.06 kg/m.    PHQ2/9:    01/17/2024    2:15 PM 10/14/2023   12:58 PM 05/24/2023    1:57 PM 04/30/2023    1:24 PM 02/28/2023   11:26 AM  Depression screen PHQ 2/9  Decreased Interest 0 0 0 0 0  Down, Depressed, Hopeless 0 0 0 0 0  PHQ  - 2 Score 0 0 0 0 0  Altered sleeping 0 0 0 0   Tired, decreased energy 0 0 0 0   Change in appetite 0 0 0 0   Feeling bad or failure about yourself  0 0 0 0   Trouble concentrating 0 0 0 0   Moving slowly or fidgety/restless 0 0 0 0   Suicidal thoughts 0 0 0 0   PHQ-9 Score 0 0 0 0   Difficult doing work/chores Not difficult at all Not difficult at all Not difficult at all Not difficult at all     phq 9 is negative  Fall Risk:    01/17/2024    2:15 PM 10/14/2023   12:58 PM 04/30/2023    1:18 PM 02/28/2023   11:25 AM 01/25/2023    8:34 AM  Fall Risk   Falls in the past year? 0 0 0 0 0  Number falls in past yr: 0 0 0 0 0  Injury with Fall? 0 0 0 0 0  Risk for fall due to :  No Fall Risks No Fall Risks No Fall Risks No Fall Risks   Follow up Falls evaluation completed Falls prevention discussed;Education provided;Falls evaluation completed Falls prevention discussed;Education provided;Falls evaluation completed Falls prevention discussed      Assessment & Plan Morbid obesity (BMI >35) BMI 35.01 with 10-pound weight loss. Prefers Zepbound  for appetite control but insurance issues necessitate Wegovy . Engaged in physical activity and motivated for weight loss. - Continue Wegovy  as prescribed. - Encourage regular physical activity and healthy eating habits. - Monitor weight and BMI. - Discuss potential insurance coverage for Zepbound  if weight gain occurs.  Essential hypertension Well-controlled at 118/74 mmHg. Advised to monitor for hypotension symptoms with weight loss. - Continue current antihypertensive regimen. - Monitor blood pressure regularly. - Advise to reduce antihypertensive medication if symptoms of hypotension occur.  Dyslipidemia/Tortuous aorta Managed with atorvastatin . Due for lipid level assessment. - Continue atorvastatin . - Order lipid panel.  Prediabetes Managed with lifestyle modifications and GLP-1 agonist therapy. Engaged in physical activity and dietary  changes. - Order A1c test. - Encourage continued lifestyle modifications.  Subclinical hyperthyroidism Previously low TSH levels. - Order TSH and free T4 tests to reassess thyroid  function.  Perennial allergic rhinitis with seasonal variation Managed with Flonase . Needs refill. - Prescribe Flonase .  General Health Maintenance Includes vaccination status check. - Check hepatitis B vaccination status. - Offer flu shot, declined by patient.

## 2024-01-24 ENCOUNTER — Ambulatory Visit: Admitting: Family Medicine

## 2024-02-19 ENCOUNTER — Telehealth: Payer: Self-pay

## 2024-02-19 NOTE — Telephone Encounter (Signed)
 Copied from CRM [id number]. Topic: Referral - Request for Referral >> Feb 19, 2024  3:14 PM Zebedee SAUNDERS wrote: Did the patient discuss referral with their provider in the last year? Yes (If No - schedule appointment) (If Yes - send message)  Appointment offered? Yes  Type of order/referral and detailed reason for visit: Panniculitis  Preference of office, provider, location: Pt's area  If referral order, have you been seen by this specialty before? No (If Yes, this issue or another issue? When? Where?  Can we respond through MyChart? No

## 2024-02-20 NOTE — Telephone Encounter (Signed)
 Left detailed vm

## 2024-03-06 ENCOUNTER — Other Ambulatory Visit: Payer: Self-pay | Admitting: Family Medicine

## 2024-03-09 DIAGNOSIS — H66002 Acute suppurative otitis media without spontaneous rupture of ear drum, left ear: Secondary | ICD-10-CM | POA: Diagnosis not present

## 2024-03-09 DIAGNOSIS — M791 Myalgia, unspecified site: Secondary | ICD-10-CM | POA: Diagnosis not present

## 2024-03-09 DIAGNOSIS — Z1152 Encounter for screening for COVID-19: Secondary | ICD-10-CM | POA: Diagnosis not present

## 2024-03-09 DIAGNOSIS — J029 Acute pharyngitis, unspecified: Secondary | ICD-10-CM | POA: Diagnosis not present

## 2024-03-09 NOTE — Telephone Encounter (Signed)
 Requested Prescriptions  Pending Prescriptions Disp Refills   semaglutide -weight management (WEGOVY ) 2.4 MG/0.75ML SOAJ SQ injection [Pharmacy Med Name: WEGOVY  2.4 MG/0.75 ML PEN] 9 mL 0    Sig: INJECT 2.4 MILLIGRAM INTO THE SKIN ONCE A WEEK     Endocrinology:  Diabetes - GLP-1 Receptor Agonists - semaglutide  Failed - 03/09/2024  4:00 PM      Failed - HBA1C in normal range and within 180 days    Hgb A1c MFr Bld  Date Value Ref Range Status  09/12/2022 5.4 <5.7 % of total Hgb Final    Comment:    For the purpose of screening for the presence of diabetes: . <5.7%       Consistent with the absence of diabetes 5.7-6.4%    Consistent with increased risk for diabetes             (prediabetes) > or =6.5%  Consistent with diabetes . This assay result is consistent with a decreased risk of diabetes. . Currently, no consensus exists regarding use of hemoglobin A1c for diagnosis of diabetes in children. . According to American Diabetes Association (ADA) guidelines, hemoglobin A1c <7.0% represents optimal control in non-pregnant diabetic patients. Different metrics may apply to specific patient populations.  Standards of Medical Care in Diabetes(ADA). .          Failed - Cr in normal range and within 360 days    Creat  Date Value Ref Range Status  09/12/2022 0.65 0.50 - 1.03 mg/dL Final         Passed - Valid encounter within last 6 months    Recent Outpatient Visits           1 month ago Morbid obesity [facility])   [place] [facility] Glenard Mire, MD   4 months ago Morbid obesity [facility])   [place] [facility] Glenard Mire, MD   7 months ago Morbid obesity [facility])   [facility] Sowles, Krichna, MD

## 2024-03-19 ENCOUNTER — Other Ambulatory Visit: Payer: Self-pay | Admitting: Family Medicine

## 2024-03-19 DIAGNOSIS — I771 Stricture of artery: Secondary | ICD-10-CM

## 2024-03-24 NOTE — Telephone Encounter (Signed)
 Requested Prescriptions  Pending Prescriptions Disp Refills   atorvastatin  (LIPITOR) 10 MG tablet [Pharmacy Med Name: ATORVASTATIN  10 MG TABLET] 90 tablet 0    Sig: TAKE 1 TABLET BY MOUTH EVERY DAY     Cardiovascular:  Antilipid - Statins Failed - 03/24/2024 11:31 AM      Failed - Lipid Panel in normal range within the last 12 months    Cholesterol, Total  Date Value Ref Range Status  11/10/2014 120 100 - 199 mg/dL Final   Cholesterol  Date Value Ref Range Status  09/12/2022 95 <200 mg/dL Final   LDL Cholesterol (Calc)  Date Value Ref Range Status  09/12/2022 20 mg/dL (calc) Final    Comment:    Reference range: <100 . Desirable range <100 mg/dL for primary prevention;   <70 mg/dL for patients with CHD or diabetic patients  with > or = 2 CHD risk factors. SABRA LDL-C is now calculated using the Martin-Hopkins  calculation, which is a validated novel method providing  better accuracy than the Friedewald equation in the  estimation of LDL-C.  Gladis APPLETHWAITE et al. SANDREA. 7986;689(80): 2061-2068  (http://education.[employer].com/faq/FAQ164)    HDL  Date Value Ref Range Status  09/12/2022 62 > OR = 50 mg/dL Final  92/79/7983 68 >60 mg/dL Final    Comment:    According to ATP-III Guidelines, HDL-C >59 mg/dL is considered a negative risk factor for CHD.    Triglycerides  Date Value Ref Range Status  09/12/2022 45 <150 mg/dL Final         Passed - Patient is not pregnant      Passed - Valid encounter within last 12 months    Recent Outpatient Visits           2 months ago Morbid obesity [facility])   [place] [facility] Glenard Mire, MD   5 months ago Morbid obesity [facility])   [place] [facility] Glenard Mire, MD   8 months ago Morbid obesity [facility])   [facility] Sowles, Krichna, MD

## 2024-04-27 ENCOUNTER — Ambulatory Visit: Admitting: Family Medicine

## 2024-04-27 ENCOUNTER — Encounter: Payer: Self-pay | Admitting: Family Medicine

## 2024-04-27 VITALS — BP 114/76 | HR 97 | Resp 16 | Ht 62.0 in | Wt 180.1 lb

## 2024-04-27 DIAGNOSIS — E042 Nontoxic multinodular goiter: Secondary | ICD-10-CM | POA: Diagnosis not present

## 2024-04-27 DIAGNOSIS — I10 Essential (primary) hypertension: Secondary | ICD-10-CM

## 2024-04-27 DIAGNOSIS — R7303 Prediabetes: Secondary | ICD-10-CM

## 2024-04-27 DIAGNOSIS — I771 Stricture of artery: Secondary | ICD-10-CM

## 2024-04-27 DIAGNOSIS — E0590 Thyrotoxicosis, unspecified without thyrotoxic crisis or storm: Secondary | ICD-10-CM | POA: Diagnosis not present

## 2024-04-27 DIAGNOSIS — E66811 Obesity, class 1: Secondary | ICD-10-CM | POA: Diagnosis not present

## 2024-04-27 DIAGNOSIS — N83291 Other ovarian cyst, right side: Secondary | ICD-10-CM

## 2024-04-27 MED ORDER — WEGOVY 2.4 MG/0.75ML ~~LOC~~ SOAJ: 2.4000 mg | SUBCUTANEOUS | 0 refills | Status: AC

## 2024-04-27 MED ORDER — HYDROCHLOROTHIAZIDE 12.5 MG PO TABS: 12.5000 mg | ORAL_TABLET | Freq: Every day | ORAL | 1 refills | Status: AC

## 2024-04-27 MED ORDER — ATORVASTATIN CALCIUM 10 MG PO TABS: 10.0000 mg | ORAL_TABLET | Freq: Every day | ORAL | 1 refills | Status: AC

## 2024-04-27 NOTE — Progress Notes (Signed)
 Name: Wendy Kent   MRN: [id number]    DOB: [birth year]   Date:04/27/2024       Progress Note  Subjective  Chief Complaint  Chief Complaint  Patient presents with   Medical Management of Chronic Issues   Discussed the use of AI scribe software for clinical note transcription with the patient, who gave verbal consent to proceed.  History of Present Illness Wendy Kent is a 54 year old female with hypertension, hypercholesterolemia, and morbid obesity who presents for follow-up on her weight management and medication review.  She has a history of morbid obesity and is currently on Wegovy  2.4 mg for weight management. She started Wegovy  in February 2023 when her weight was 226 lbs, reaching a low of 206 lbs. After switching to Zepbound  in October 2024, her weight plateaued at 201.6 lbs. She resumed Wegovy , and her current weight is 180 lbs, down from 191 lbs three months ago. She practices intermittent fasting and consumes fruits and vegetables. No current abdominal pain, discomfort, or nausea, though she experienced these symptoms initially.  She has hypertension and is taking HCTZ 12.5 mg daily. She experienced an episode of dizziness at church after not eating or drinking before the service. No regular dizziness or lightheadedness.  She is on atorvastatin  for hypercholesterolemia due to a history of tortuous aorta and confirms taking the medication regularly. She uses a nasal spray as needed for nasal congestion.  She has a history of a complex cyst on the right ovary, which was decreasing in size and suspected to be an endometrioma. She has not followed up recently but had a hysterectomy in the past. She reports seeing Dr. Starla once or twice for this issue.  She has a multinodular goiter and is monitored for thyroid  function. She is aware of her metabolic syndrome and is taking Wegovy .  She reports a past ear infection around Christmas, which was treated at a medical clinic. She  has been self-treating for ear fullness with Flonase , which she uses as needed.    Patient Active Problem List   Diagnosis Date Noted   Right ovarian cyst 01/16/2022   Ankle pain 03/30/2021   Rupture of right Achilles tendon 03/30/2021   Thyroid  nodule 11/17/2020   Tortuous aorta 05/19/2020   Fatty liver 02/26/2020   Lymphedema 01/21/2019   Varicose veins of leg with pain, bilateral 10/14/2018   Lactose intolerance 09/26/2015   IBS (irritable bowel syndrome) 09/26/2015   Multinodular goiter 09/28/2014   Morbid obesity (HCC) 09/28/2014   Hyperthyroidism, subclinical 09/28/2014   Allergic rhinitis, seasonal 09/28/2014   Hypertension goal BP (blood pressure) < 140/90 12/29/2012    Past Surgical History:  Procedure Laterality Date   ABDOMINAL HYSTERECTOMY     patient still has ovaries   BREAST REDUCTION SURGERY Bilateral 08/05/2017   Procedure: BREAST REDUCTION WITH LIPOSUCTION;  Surgeon: Marcus Lung, MD;  Location: [facility];  Service: Plastics;  Laterality: Bilateral;   CHOLECYSTECTOMY     COLONOSCOPY WITH PROPOFOL  N/A 03/26/2018   Procedure: COLONOSCOPY WITH PROPOFOL ;  Surgeon: Therisa Bi, MD;  Location: [facility] END[facility]COPY;  Service: Gastroenterology;  Laterality: N/A;   ESOPHAGOGASTRODUODENOSCOPY (EGD) WITH PROPOFOL  N/A 03/26/2018   Procedure: ESOPHAGOGASTRODUODENOSCOPY (EGD) WITH PROPOFOL ;  Surgeon: Therisa Bi, MD;  Location: [facility] END[facility]COPY;  Service: Gastroenterology;  Laterality: N/A;   GIVENS CAPSULE STUDY N/A 04/14/2018   Procedure: GIVENS CAPSULE STUDY;  Surgeon: Therisa Bi, MD;  Location: [facility] ENDOSCOPY;  Service: Gastroenterology;  Laterality: N/A;   REDUCTION MAMMAPLASTY  2019   SHOULDER ARTHROSCOPY Right    TUBAL LIGATION      Family History  Problem Relation Age of Onset   Hyperthyroidism Sister    Alcohol abuse Brother     Social History   Tobacco Use   Smoking status: Never   Smokeless tobacco: Never  Substance Use Topics    Alcohol use: Yes    Alcohol/week: 0.0 standard drinks of alcohol    Comment: socially    Current Medications[1]  Allergies[2]  I personally reviewed active problem list, medication list, allergies, family history with the patient/caregiver today.   ROS  Ten systems reviewed and is negative except as mentioned in HPI    Objective Physical Exam  CONSTITUTIONAL: Patient appears well-developed and well-nourished. No distress. HEENT: Head atraumatic, normocephalic, neck supple. Ears without infection. CARDIOVASCULAR: Normal rate, regular rhythm and normal heart sounds. No murmur heard. No edema in extremities. PULMONARY: Effort normal and breath sounds normal. No respiratory distress. Lungs clear to auscultation. ABDOMINAL: There is no tenderness or distention. MUSCULOSKELETAL: Normal gait. Without gross motor or sensory deficit. PSYCHIATRIC: Patient has a normal mood and affect. Behavior is normal. Judgment and thought content normal.  Vitals:   04/27/24 1443  BP: 114/76  Pulse: 97  Resp: 16  SpO2: 99%  Weight: 180 lb 1.6 oz (81.7 kg)  Height: 5' 2 (1.575 m)    Body mass index is 32.94 kg/m.   PHQ2/9:    04/27/2024    2:39 PM 01/17/2024    2:15 PM 10/14/2023   12:58 PM 05/24/2023    1:57 PM 04/30/2023    1:24 PM  Depression screen PHQ 2/9  Decreased Interest 0 0 0 0 0  Down, Depressed, Hopeless 0 0 0 0 0  PHQ - 2 Score 0 0 0 0 0  Altered sleeping  0 0 0 0  Tired, decreased energy  0 0 0 0  Change in appetite  0 0 0 0  Feeling bad or failure about yourself   0 0 0 0  Trouble concentrating  0 0 0 0  Moving slowly or fidgety/restless  0 0 0 0  Suicidal thoughts  0 0 0 0  PHQ-9 Score  0  0  0  0   Difficult doing work/chores  Not difficult at all Not difficult at all Not difficult at all Not difficult at all     Data saved with a previous flowsheet row definition    phq 9 is negative  Fall Risk:    04/27/2024    2:39 PM 01/17/2024    2:15 PM 10/14/2023   12:58 PM  04/30/2023    1:18 PM 02/28/2023   11:25 AM  Fall Risk   Falls in the past year? 0 0 0 0 0  Number falls in past yr: 0 0 0 0 0  Injury with Fall? 0 0  0  0  0   Risk for fall due to : No Fall Risks No Fall Risks No Fall Risks No Fall Risks No Fall Risks  Follow up Falls evaluation completed Falls evaluation completed Falls prevention discussed;Education provided;Falls evaluation completed Falls prevention discussed;Education provided;Falls evaluation completed Falls prevention discussed     Data saved with a previous flowsheet row definition     Assessment & Plan Obesity, class 1 Significant weight loss achieved. Current BMI 30.94. Emphasized protein intake to prevent sarcopenia. - Continue Wegovy  2.4 mg for weight management. - Ensure 100 grams of protein intake daily. - Encouraged  protein-rich foods and supplements. - Advised against skipping meals.  Essential hypertension Well-controlled with current medication. Blood pressure 114/76 mmHg. - Continue HCTZ 12.5 mg daily. - Advise adequate hydration and protein intake before long events. - Monitor blood pressure regularly.  Tortuous aorta Managed with atorvastatin . No acute issues. - Continue atorvastatin . - Order lipid panel to monitor cholesterol levels.  Multinodular goiter Monitored with thyroid  function tests. No acute issues. - Order TSH and free T4 to monitor thyroid  function.  Complex cyst of right ovary Previously evaluated by Dr. Starla. Last ultrasound showed decreasing mass size. - Recommend follow-up with Dr. Starla for evaluation.  General health maintenance Discussed vaccinations and screenings. Hepatitis B vaccination status to be assessed. - Order CBC for anemia screening. - Order comprehensive metabolic panel including liver function tests. - Order hepatitis B surface antibodies to assess vaccination need.        [1]  Current Outpatient Medications:    atorvastatin  (LIPITOR) 10 MG tablet, TAKE 1 TABLET  BY MOUTH EVERY DAY, Disp: 90 tablet, Rfl: 0   fluticasone  (FLONASE ) 50 MCG/ACT nasal spray, Place 2 sprays into both nostrils daily., Disp: 48 mL, Rfl: 0   hydrochlorothiazide  (HYDRODIURIL ) 12.5 MG tablet, Take 1 tablet (12.5 mg total) by mouth daily., Disp: 90 tablet, Rfl: 0   semaglutide -weight management (WEGOVY ) 2.4 MG/0.75ML SOAJ SQ injection, INJECT 2.4 MILLIGRAM INTO THE SKIN ONCE A WEEK, Disp: 9 mL, Rfl: 0 [2]  Allergies Allergen Reactions   Percocet [Oxycodone-Acetaminophen ] Anaphylaxis   Percocet [place].[email or web address] ] Anaphylaxis    Difficulty breathing

## 2024-04-28 LAB — HEMOGLOBIN A1C
Hgb A1c MFr Bld: 5.3 %
Mean Plasma Glucose: 105 mg/dL
eAG (mmol/L): 5.8 mmol/L

## 2024-04-28 LAB — COMPREHENSIVE METABOLIC PANEL WITH GFR
AG Ratio: 1.5 (calc) (ref 1.0–2.5)
ALT: 14 U/L (ref 6–29)
AST: 12 U/L (ref 10–35)
Albumin: 4.3 g/dL (ref 3.6–5.1)
Alkaline phosphatase (APISO): 88 U/L (ref 37–153)
BUN: 11 mg/dL (ref 7–25)
CO2: 32 mmol/L (ref 20–32)
Calcium: 9.8 mg/dL (ref 8.6–10.4)
Chloride: 98 mmol/L (ref 98–110)
Creat: 0.5 mg/dL (ref 0.50–1.03)
Globulin: 2.9 g/dL (ref 1.9–3.7)
Glucose, Bld: 87 mg/dL (ref 65–99)
Potassium: 4.4 mmol/L (ref 3.5–5.3)
Sodium: 140 mmol/L (ref 135–146)
Total Bilirubin: 0.3 mg/dL (ref 0.2–1.2)
Total Protein: 7.2 g/dL (ref 6.1–8.1)
eGFR: 112 mL/min/1.73m2

## 2024-04-28 LAB — CBC WITH DIFFERENTIAL/PLATELET
Absolute Lymphocytes: 3088 {cells}/uL (ref 850–3900)
Absolute Monocytes: 554 {cells}/uL (ref 200–950)
Basophils Absolute: 31 {cells}/uL (ref 0–200)
Basophils Relative: 0.4 %
Eosinophils Absolute: 92 {cells}/uL (ref 15–500)
Eosinophils Relative: 1.2 %
HCT: 40.8 % (ref 35.9–46.0)
Hemoglobin: 13.1 g/dL (ref 11.7–15.5)
MCH: 27.2 pg (ref 27.0–33.0)
MCHC: 32.1 g/dL (ref 31.6–35.4)
MCV: 84.8 fL (ref 81.4–101.7)
MPV: 10.7 fL (ref 7.5–12.5)
Monocytes Relative: 7.2 %
Neutro Abs: 3935 {cells}/uL (ref 1500–7800)
Neutrophils Relative %: 51.1 %
Platelets: 356 Thousand/uL (ref 140–400)
RBC: 4.81 Million/uL (ref 3.80–5.10)
RDW: 13.6 % (ref 11.0–15.0)
Total Lymphocyte: 40.1 %
WBC: 7.7 Thousand/uL (ref 3.8–10.8)

## 2024-04-28 LAB — HEPATITIS B SURFACE ANTIBODY,QUALITATIVE: Hep B S Ab: NONREACTIVE

## 2024-04-28 LAB — LIPID PANEL
Cholesterol: 92 mg/dL
HDL: 56 mg/dL
LDL Cholesterol (Calc): 23 mg/dL
Non-HDL Cholesterol (Calc): 36 mg/dL
Total CHOL/HDL Ratio: 1.6 (calc)
Triglycerides: 57 mg/dL

## 2024-04-28 LAB — TSH+FREE T4: TSH W/REFLEX TO FT4: <0.01 m[IU]/L — ABNORMAL LOW

## 2024-04-28 LAB — T4, FREE: Free T4: 1.9 ng/dL — ABNORMAL HIGH (ref 0.8–1.8)

## 2024-04-29 ENCOUNTER — Ambulatory Visit: Payer: Self-pay | Admitting: Family Medicine

## 2024-05-18 ENCOUNTER — Other Ambulatory Visit: Payer: Self-pay | Admitting: Family Medicine

## 2024-05-18 DIAGNOSIS — I10 Essential (primary) hypertension: Secondary | ICD-10-CM

## 2024-05-19 NOTE — Telephone Encounter (Signed)
 No longer current dosing Requested Prescriptions  Pending Prescriptions Disp Refills   hydrochlorothiazide  (HYDRODIURIL ) 25 MG tablet [Pharmacy Med Name: HYDROCHLOROTHIAZIDE  25 MG TAB] 90 tablet 1    Sig: TAKE 1 TABLET (25 MG TOTAL) BY MOUTH DAILY.     Cardiovascular: Diuretics - Thiazide Passed - 05/19/2024 10:23 AM      Passed - Cr in normal range and within 180 days    Creat  Date Value Ref Range Status  04/27/2024 0.50 0.50 - 1.03 mg/dL Final         Passed - K in normal range and within 180 days    Potassium  Date Value Ref Range Status  04/27/2024 4.4 3.5 - 5.3 mmol/L Final         Passed - Na in normal range and within 180 days    Sodium  Date Value Ref Range Status  04/27/2024 140 135 - 146 mmol/L Final  11/10/2014 139 134 - 144 mmol/L Final         Passed - Last BP in normal range    BP Readings from Last 1 Encounters:  04/27/24 114/76         Passed - Valid encounter within last 6 months    Recent Outpatient Visits           3 weeks ago Hypertension goal BP (blood pressure) < 140/90   [facility] Glenard Mire, MD   4 months ago Morbid obesity [facility])   [place] [facility] Glenard Mire, MD   7 months ago Morbid obesity [facility])   [place] [facility] Glenard Mire, MD   10 months ago Morbid obesity [facility])   [facility] Sowles, Krichna, MD

## 2024-07-27 ENCOUNTER — Ambulatory Visit: Admitting: Family Medicine
